# Patient Record
Sex: Female | Born: 1955 | Race: White | Hispanic: No | Marital: Single | Smoking: Never smoker
Health system: Southern US, Community
[De-identification: ages and names within clinical notes are randomized; demographics above are authoritative.]

## PROBLEM LIST (undated history)

## (undated) DIAGNOSIS — M1990 Unspecified osteoarthritis, unspecified site: Secondary | ICD-10-CM

## (undated) DIAGNOSIS — E785 Hyperlipidemia, unspecified: Secondary | ICD-10-CM

## (undated) DIAGNOSIS — Z8619 Personal history of other infectious and parasitic diseases: Secondary | ICD-10-CM

## (undated) DIAGNOSIS — K449 Diaphragmatic hernia without obstruction or gangrene: Secondary | ICD-10-CM

## (undated) DIAGNOSIS — K219 Gastro-esophageal reflux disease without esophagitis: Secondary | ICD-10-CM

## (undated) DIAGNOSIS — E039 Hypothyroidism, unspecified: Secondary | ICD-10-CM

## (undated) DIAGNOSIS — K5792 Diverticulitis of intestine, part unspecified, without perforation or abscess without bleeding: Secondary | ICD-10-CM

## (undated) DIAGNOSIS — E663 Overweight: Secondary | ICD-10-CM

## (undated) HISTORY — DX: Diverticulitis of intestine, part unspecified, without perforation or abscess without bleeding: K57.92

## (undated) HISTORY — DX: Overweight: E66.3

## (undated) HISTORY — DX: Personal history of other infectious and parasitic diseases: Z86.19

## (undated) HISTORY — DX: Hyperlipidemia, unspecified: E78.5

## (undated) HISTORY — DX: Hypothyroidism, unspecified: E03.9

## (undated) HISTORY — PX: REDUCTION MAMMAPLASTY: SUR839

## (undated) HISTORY — DX: Gastro-esophageal reflux disease without esophagitis: K21.9

## (undated) HISTORY — PX: COLONOSCOPY: SHX174

## (undated) HISTORY — DX: Diaphragmatic hernia without obstruction or gangrene: K44.9

## (undated) HISTORY — DX: Unspecified osteoarthritis, unspecified site: M19.90

---

## 1997-10-07 HISTORY — PX: OTHER SURGICAL HISTORY: SHX169

## 1998-06-01 ENCOUNTER — Ambulatory Visit (HOSPITAL_BASED_OUTPATIENT_CLINIC_OR_DEPARTMENT_OTHER): Admission: RE | Admit: 1998-06-01 | Discharge: 1998-06-01 | Payer: Self-pay | Admitting: Orthopedic Surgery

## 1998-07-04 ENCOUNTER — Other Ambulatory Visit: Admission: RE | Admit: 1998-07-04 | Discharge: 1998-07-04 | Payer: Self-pay | Admitting: Obstetrics and Gynecology

## 1998-10-07 HISTORY — PX: OTHER SURGICAL HISTORY: SHX169

## 2000-01-09 ENCOUNTER — Other Ambulatory Visit: Admission: RE | Admit: 2000-01-09 | Discharge: 2000-01-09 | Payer: Self-pay | Admitting: Obstetrics and Gynecology

## 2001-04-21 ENCOUNTER — Other Ambulatory Visit: Admission: RE | Admit: 2001-04-21 | Discharge: 2001-04-21 | Payer: Self-pay | Admitting: Obstetrics and Gynecology

## 2005-08-05 ENCOUNTER — Other Ambulatory Visit: Admission: RE | Admit: 2005-08-05 | Discharge: 2005-08-05 | Payer: Self-pay | Admitting: Obstetrics and Gynecology

## 2006-11-04 ENCOUNTER — Ambulatory Visit: Payer: Self-pay | Admitting: Pulmonary Disease

## 2007-01-21 ENCOUNTER — Ambulatory Visit: Payer: Self-pay | Admitting: Pulmonary Disease

## 2008-03-03 ENCOUNTER — Telehealth: Payer: Self-pay | Admitting: Pulmonary Disease

## 2008-04-12 DIAGNOSIS — E039 Hypothyroidism, unspecified: Secondary | ICD-10-CM | POA: Insufficient documentation

## 2008-04-12 DIAGNOSIS — E785 Hyperlipidemia, unspecified: Secondary | ICD-10-CM | POA: Insufficient documentation

## 2008-04-12 DIAGNOSIS — J45909 Unspecified asthma, uncomplicated: Secondary | ICD-10-CM

## 2008-04-13 ENCOUNTER — Ambulatory Visit: Payer: Self-pay | Admitting: Pulmonary Disease

## 2008-04-13 DIAGNOSIS — K449 Diaphragmatic hernia without obstruction or gangrene: Secondary | ICD-10-CM | POA: Insufficient documentation

## 2008-04-13 DIAGNOSIS — J309 Allergic rhinitis, unspecified: Secondary | ICD-10-CM | POA: Insufficient documentation

## 2008-04-13 DIAGNOSIS — M1990 Unspecified osteoarthritis, unspecified site: Secondary | ICD-10-CM | POA: Insufficient documentation

## 2008-04-13 DIAGNOSIS — E663 Overweight: Secondary | ICD-10-CM | POA: Insufficient documentation

## 2008-04-13 DIAGNOSIS — F411 Generalized anxiety disorder: Secondary | ICD-10-CM | POA: Insufficient documentation

## 2008-09-05 ENCOUNTER — Ambulatory Visit: Payer: Self-pay | Admitting: Pulmonary Disease

## 2008-09-05 ENCOUNTER — Ambulatory Visit: Payer: Self-pay | Admitting: Internal Medicine

## 2008-09-05 ENCOUNTER — Telehealth (INDEPENDENT_AMBULATORY_CARE_PROVIDER_SITE_OTHER): Payer: Self-pay | Admitting: *Deleted

## 2008-09-05 DIAGNOSIS — M549 Dorsalgia, unspecified: Secondary | ICD-10-CM | POA: Insufficient documentation

## 2008-09-05 LAB — CONVERTED CEMR LAB
Bilirubin Urine: NEGATIVE
Crystals: NEGATIVE
Hemoglobin, Urine: NEGATIVE
Ketones, ur: NEGATIVE mg/dL
Leukocytes, UA: NEGATIVE
Mucus, UA: NEGATIVE
Nitrite: NEGATIVE
Specific Gravity, Urine: 1.01 (ref 1.000–1.03)
Total Protein, Urine: NEGATIVE mg/dL
Urine Glucose: NEGATIVE mg/dL
Urobilinogen, UA: 0.2 (ref 0.0–1.0)
pH: 7 (ref 5.0–8.0)

## 2008-09-06 ENCOUNTER — Encounter: Payer: Self-pay | Admitting: Adult Health

## 2008-09-08 ENCOUNTER — Ambulatory Visit: Payer: Self-pay | Admitting: Pulmonary Disease

## 2008-09-08 ENCOUNTER — Telehealth (INDEPENDENT_AMBULATORY_CARE_PROVIDER_SITE_OTHER): Payer: Self-pay | Admitting: *Deleted

## 2008-09-08 ENCOUNTER — Ambulatory Visit: Payer: Self-pay | Admitting: Radiology

## 2008-09-08 ENCOUNTER — Encounter: Payer: Self-pay | Admitting: Adult Health

## 2008-09-08 ENCOUNTER — Ambulatory Visit (HOSPITAL_BASED_OUTPATIENT_CLINIC_OR_DEPARTMENT_OTHER): Admission: RE | Admit: 2008-09-08 | Discharge: 2008-09-08 | Payer: Self-pay | Admitting: Critical Care Medicine

## 2008-09-09 LAB — CONVERTED CEMR LAB
ALT: 31 units/L (ref 0–35)
AST: 30 units/L (ref 0–37)
Albumin: 3.8 g/dL (ref 3.5–5.2)
Alkaline Phosphatase: 84 units/L (ref 39–117)
BUN: 9 mg/dL (ref 6–23)
Basophils Absolute: 0 10*3/uL (ref 0.0–0.1)
Basophils Relative: 0 % (ref 0.0–3.0)
Bilirubin Urine: NEGATIVE
Bilirubin, Direct: 0.1 mg/dL (ref 0.0–0.3)
CO2: 30 meq/L (ref 19–32)
Calcium: 8.6 mg/dL (ref 8.4–10.5)
Chloride: 105 meq/L (ref 96–112)
Creatinine, Ser: 0.9 mg/dL (ref 0.4–1.2)
Eosinophils Absolute: 0.3 10*3/uL (ref 0.0–0.7)
Eosinophils Relative: 4.2 % (ref 0.0–5.0)
GFR calc Af Amer: 85 mL/min
GFR calc non Af Amer: 70 mL/min
Glucose, Bld: 99 mg/dL (ref 70–99)
HCT: 42.8 % (ref 36.0–46.0)
Hemoglobin, Urine: NEGATIVE
Hemoglobin: 14.7 g/dL (ref 12.0–15.0)
Ketones, ur: NEGATIVE mg/dL
Leukocytes, UA: NEGATIVE
Lymphocytes Relative: 34.3 % (ref 12.0–46.0)
MCHC: 34.4 g/dL (ref 30.0–36.0)
MCV: 92.1 fL (ref 78.0–100.0)
Monocytes Absolute: 0.4 10*3/uL (ref 0.1–1.0)
Monocytes Relative: 6.1 % (ref 3.0–12.0)
Neutro Abs: 3.8 10*3/uL (ref 1.4–7.7)
Neutrophils Relative %: 55.4 % (ref 43.0–77.0)
Nitrite: NEGATIVE
Platelets: 287 10*3/uL (ref 150–400)
Potassium: 3.8 meq/L (ref 3.5–5.1)
RBC: 4.64 M/uL (ref 3.87–5.11)
RDW: 12.4 % (ref 11.5–14.6)
Sodium: 140 meq/L (ref 135–145)
Specific Gravity, Urine: <=1.005 (ref 1.000–1.03)
Total Bilirubin: 0.5 mg/dL (ref 0.3–1.2)
Total Protein, Urine: NEGATIVE mg/dL
Total Protein: 6.4 g/dL (ref 6.0–8.3)
Urine Glucose: NEGATIVE mg/dL
Urobilinogen, UA: 0.2 (ref 0.0–1.0)
WBC: 6.8 10*3/uL (ref 4.5–10.5)
pH: 6 (ref 5.0–8.0)

## 2008-09-13 ENCOUNTER — Encounter: Payer: Self-pay | Admitting: Adult Health

## 2008-09-27 ENCOUNTER — Ambulatory Visit: Payer: Self-pay | Admitting: Pulmonary Disease

## 2009-05-08 ENCOUNTER — Telehealth (INDEPENDENT_AMBULATORY_CARE_PROVIDER_SITE_OTHER): Payer: Self-pay | Admitting: *Deleted

## 2009-05-25 ENCOUNTER — Encounter: Payer: Self-pay | Admitting: Pulmonary Disease

## 2009-06-22 ENCOUNTER — Ambulatory Visit: Payer: Self-pay | Admitting: Pulmonary Disease

## 2009-06-22 DIAGNOSIS — B029 Zoster without complications: Secondary | ICD-10-CM | POA: Insufficient documentation

## 2009-06-22 DIAGNOSIS — E559 Vitamin D deficiency, unspecified: Secondary | ICD-10-CM | POA: Insufficient documentation

## 2009-11-20 ENCOUNTER — Telehealth: Payer: Self-pay | Admitting: Pulmonary Disease

## 2010-01-12 ENCOUNTER — Ambulatory Visit: Payer: Self-pay | Admitting: Pulmonary Disease

## 2010-07-16 ENCOUNTER — Ambulatory Visit: Payer: Self-pay | Admitting: Pulmonary Disease

## 2010-07-16 ENCOUNTER — Encounter: Payer: Self-pay | Admitting: Pulmonary Disease

## 2010-07-16 ENCOUNTER — Telehealth: Payer: Self-pay | Admitting: Pulmonary Disease

## 2010-07-19 ENCOUNTER — Encounter: Payer: Self-pay | Admitting: Pulmonary Disease

## 2010-07-20 LAB — CONVERTED CEMR LAB
ALT: 29 units/L (ref 0–35)
AST: 20 units/L (ref 0–37)
Albumin: 4.1 g/dL (ref 3.5–5.2)
Alkaline Phosphatase: 87 units/L (ref 39–117)
BUN: 12 mg/dL (ref 6–23)
Basophils Absolute: 0.1 10*3/uL (ref 0.0–0.1)
Basophils Relative: 1.1 % (ref 0.0–3.0)
Bilirubin, Direct: 0 mg/dL (ref 0.0–0.3)
CO2: 30 meq/L (ref 19–32)
Calcium: 9.1 mg/dL (ref 8.4–10.5)
Chloride: 106 meq/L (ref 96–112)
Cholesterol: 198 mg/dL (ref 0–200)
Creatinine, Ser: 0.9 mg/dL (ref 0.4–1.2)
Eosinophils Absolute: 0.3 10*3/uL (ref 0.0–0.7)
Eosinophils Relative: 5 % (ref 0.0–5.0)
GFR calc non Af Amer: 74.17 mL/min (ref >60)
Glucose, Bld: 108 mg/dL — ABNORMAL HIGH (ref 70–99)
HCT: 42.5 % (ref 36.0–46.0)
HDL: 45.5 mg/dL (ref >39.00)
Hemoglobin: 14.9 g/dL (ref 12.0–15.0)
LDL Cholesterol: 131 mg/dL — ABNORMAL HIGH (ref 0–99)
Lymphocytes Relative: 29.4 % (ref 12.0–46.0)
Lymphs Abs: 2 10*3/uL (ref 0.7–4.0)
MCHC: 34.9 g/dL (ref 30.0–36.0)
MCV: 90.2 fL (ref 78.0–100.0)
Monocytes Absolute: 0.5 10*3/uL (ref 0.1–1.0)
Monocytes Relative: 6.9 % (ref 3.0–12.0)
Neutro Abs: 3.9 10*3/uL (ref 1.4–7.7)
Neutrophils Relative %: 57.6 % (ref 43.0–77.0)
Platelets: 281 10*3/uL (ref 150.0–400.0)
Potassium: 4.3 meq/L (ref 3.5–5.1)
RBC: 4.72 M/uL (ref 3.87–5.11)
RDW: 12.6 % (ref 11.5–14.6)
Sodium: 142 meq/L (ref 135–145)
TSH: 4.69 microintl units/mL (ref 0.35–5.50)
Total Bilirubin: 0.5 mg/dL (ref 0.3–1.2)
Total CHOL/HDL Ratio: 4
Total Protein: 6.5 g/dL (ref 6.0–8.3)
Triglycerides: 110 mg/dL (ref 0.0–149.0)
VLDL: 22 mg/dL (ref 0.0–40.0)
Vit D, 25-Hydroxy: 44 ng/mL (ref 30–89)
WBC: 6.7 10*3/uL (ref 4.5–10.5)

## 2010-07-23 ENCOUNTER — Encounter: Payer: Self-pay | Admitting: Pulmonary Disease

## 2010-07-27 ENCOUNTER — Encounter: Payer: Self-pay | Admitting: Pulmonary Disease

## 2010-11-06 NOTE — Assessment & Plan Note (Signed)
Summary: rov/no better from 11/30--td   Chief Complaint:  Follow Up c/o back pain left lower area.  History of Present Illness: 55 y/o WF with known history of AR, Asthma, Hyperlipidemia, and hypothyroidism. Was followed by DrWelty-Wolfe at [facility] for her Asthma, then got switched to [facility], but both of them have left... I tried to get her to see Dr. Tod Persia but she has refused further referrals for her Asthma... the concern has been for the developement of fixed airway obstruction from her Asthma due to poor control and medication non-compliance in the past... she has been on ADVAIR 500/50 Bid but not taken on consistent basis.    September 05, 2008--Presents for an acute office  visit with left lower back pain. Started 3 days ago with throbbing pain in left lower back wtih radiation to hip/side. Severe at times, "waves of pain", no urinary symptoms,  . Pain at all times but worse with bending and tender at times. took vicoidin with no help.--UA neg Urine cx neg. KUB neg. tx for musculoskeletal -nsaids/skelaxin and vicodin-heat/ice  September 08, 2008 --Returns with persistent left back/flank pain with radiation to side/ribs/hip. nsaids/vicodin/skelaxin helped but only takes edge off. Still very severe, throbbing pain that comes in waves, very tender over left flank and lateral ribs. increased pain with sitting . Denies chest pain, dyspnea, orthopnea, hemoptysis, fever, n/v/d, edema, calf pain, swelling, urinary symptoms, vaginal bleeding, palipations, diarrhea. 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 no initial pain, dyspnea, neck pain, ext. weakness.      Prior Medication List:  ADVAIR DISKUS 500-50 MCG/DOSE  MISC (FLUTICASONE-SALMETEROL) 1 puff two times a day (rinse mouth after each use) VENTOLIN HFA 108 (90 BASE) MCG/ACT  AERS (ALBUTEROL SULFATE) 1-2 sprays Q 4 H as needed for wheezing... ARMOUR THYROID 60 MG  TABS (THYROID) Take 1 tablet  by mouth once a day NEXIUM 40 MG  CPDR (ESOMEPRAZOLE MAGNESIUM) Take 1 tablet by mouth once a day ANTI-OXIDANT   TABS (MULTIPLE VITAMIN) once daily VICODIN 5-500 MG TABS (HYDROCODONE-ACETAMINOPHEN) 1 every 4 to 6 hours as needed for pain SKELAXIN 800 MG TABS (METAXALONE) 1 by mouth three times a day as needed IBUPROFEN 800 MG TABS (IBUPROFEN) 1 by mouth three times a day with food for 7 days   Updated Prior Medication List: ADVAIR DISKUS 500-50 MCG/DOSE  MISC (FLUTICASONE-SALMETEROL) 1 puff two times a day (rinse mouth after each use) VENTOLIN HFA 108 (90 BASE) MCG/ACT  AERS (ALBUTEROL SULFATE) 1-2 sprays Q 4 H as needed for wheezing... ARMOUR THYROID 60 MG  TABS (THYROID) Take 1 tablet by mouth once a day NEXIUM 40 MG  CPDR (ESOMEPRAZOLE MAGNESIUM) Take 1 tablet by mouth once a day ANTI-OXIDANT   TABS (MULTIPLE VITAMIN) once daily VICODIN 5-500 MG TABS (HYDROCODONE-ACETAMINOPHEN) 1 every 4 to 6 hours as needed for pain SKELAXIN 800 MG TABS (METAXALONE) 1 by mouth three times a day as needed IBUPROFEN 800 MG TABS (IBUPROFEN) 1 by mouth three times a day with food for 7 days  Current Allergies (reviewed today): No known allergies   Past Medical History:    Reviewed history from 09/05/2008 and no changes required:       ALLERGIC RHINITIS (ICD-477.9) - ++allergy testing by DrESL in the 1980's w/ reactions to grasses, ragweed, dust, molds, cats, dog... prev on shots...              ASTHMA (ICD-493.90) - on ADVAIR  500Bid, & VENTOLIN HFA as needed... she wants refills today.        ~  baseline CXR shows sl incr markings, NAD...        ~  PFT's 5/05 showed FVC= 2.47 (76%), FEV1= 1.51 (56%), %1sec=61, mid-flows= 22%pred...        ~  in 2006 she entered the EXTRA trial at [facility] which was a double blind trial testing XOLAIR...        ~  last note from [facility] 6/07 DrCarraway w/ concern for her fixed airway obstruction... pt [facility] f[facility]u in the [facility]...              HYPERLIPIDEMIA (ICD-272.4)  - prev on Zetia, on diet alone at present... followed by DrDoerr, we have not had any labs on her since the late 90's...              HYPOTHYROIDISM (ICD-244.9) - taking ARMOUR THYROID 60mg /d... she is followed by an Actor, DrDoerr in [place] for thyroid and her hyperlipidemia... she was prev on Levothyroid and had requested change to Armour Thyroid last year...              OVERWEIGHT (ICD-278.02) - weight= 199# today, no change in the last yr...                  Family History:    Reviewed history and no changes required:  Social History:    Reviewed history and no changes required:   Risk Factors: Tobacco use:  never   Review of Systems      See HPI   Vital Signs:  Patient Profile:   55 Years Old Female Weight:      195 pounds O2 Sat:      95 % O2 treatment:    Room Air Temp:     98.2 degrees F oral Pulse rate:   72 / minute BP sitting:   130 / 84  (left arm)  Vitals Entered By: Renold Genta RCP, LPN (September 08, 2008 2:16 PM)             Is Patient Diabetic? No Comments Medications reviewed with patient Renold Genta RCP, LPN  September 08, 2008 2:17 PM      Physical Exam  WD, sl overweight, 55 y/o WF in NAD... GENERAL:  Alert & oriented; pleasant & cooperative... HEENT:  [place]/AT, EOM-wnl, PERRLA, Fundi-benign, EACs-clear, TMs-wnl, NOSE-clear, THROAT-clear & wnl. NECK:  Supple w/ full ROM; no JVD; normal carotid impulses w/o bruits; no thyromegaly or nodules palpated; no lymphadenopathy. CHEST:  Clear to P & A; decr BS bilat; without wheezes/ rales/ or rhonchi heard... HEART:  Regular Rhythm; without murmurs/ rubs/ or gallops detected... ABDOMEN:  Soft & nontender; normal bowel sounds; no organomegaly or masses palpated...no guarding or rebound neg CVA tenderness.  EXT: without deformities, mild arthritic changes; no varicose veins/ venous insuffic/ or edema. tenderness along left flank down to lumbar-sacral region. neg SLRs, also tender along  left lateral ribs.   NEURO:  CN's intact; no focal neuro deficits...nml grips bilaterally, equal strength bilaterally of UE/LE DERM:  No lesions noted; no rash etc...        Impression & Recommendations:  Problem # 1:  BACK PAIN (ICD-724.5) Left flank and low back pain ? etiology.  Pt continues with severe back pain despite aggressive pain meds.  Will need further evaluation. check cxr with rib detail --looking for rib fx. also lung abnormalities. Low suspicion for PE, will  check D. dimer.  Will repeat UA, looking for hemautura. Check CT abd/pelvis--looking for kidney stone, and/or other abdnormalities. will follow up on above labs/test.  Orders: Radiology Referral (Radiology) T-2 View CXR, Same Day (71020.5TC) TLB-BMP (Basic Metabolic Panel-BMET) (80048-METABOL) TLB-CBC Platelet - w/Differential (85025-CBCD) TLB-Hepatic/Liver Function Pnl (80076-HEPATIC) TLB-Udip ONLY (81003-UDIP) T-D-Dimer Fibrin Derivatives Quantitive (30865-78469) Est. Patient Level IV (62952)   Complete Medication List: 1)  Advair Diskus 500-50 Mcg/dose Misc (Fluticasone-salmeterol) .Marland Kitchen.. 1 puff two times a day (rinse mouth after each use) 2)  Ventolin Hfa 108 (90 Base) Mcg/act Aers (Albuterol sulfate) .Marland Kitchen.. 1-2 sprays q 4 h as needed for wheezing... 3)  Armour Thyroid 60 Mg Tabs (Thyroid) .... Take 1 tablet by mouth once a day 4)  Nexium 40 Mg Cpdr (Esomeprazole magnesium) .... Take 1 tablet by mouth once a day 5)  Anti-oxidant Tabs (Multiple vitamin) .... Once daily 6)  Vicodin 5-500 Mg Tabs (Hydrocodone-acetaminophen) .Marland Kitchen.. 1 every 4 to 6 hours as needed for pain 7)  Skelaxin 800 Mg Tabs (Metaxalone) .Marland Kitchen.. 1 by mouth three times a day as needed 8)  Ibuprofen 800 Mg Tabs (Ibuprofen) .Marland Kitchen.. 1 by mouth three times a day with food for 7 days   Patient Instructions: 1)  We are doing lab work today and chest xray 2)  We are also doing a CT scan of your abdomen and pelvis . 3)  I will call you wth the results.    4)  Continue on your present regimen for now.  5)  Please contact office for sooner follow up if symptoms do not improve or worsen    ]

## 2010-11-06 NOTE — Progress Notes (Signed)
Summary: rx refills  Phone Note Call from Patient Call back at Home Phone [phone]   Call For: nadel Summary of Call: pt wants refills of advair and nexium. she has made a f/u w/ dr Kriste Basque for sept. but is "not happy" that she has to come back for an appt. says she has spent a lot of money w/ theses appts.  Initial call taken by: Tivis Ringer,  May 08, 2009 10:10 AM  Follow-up for Phone Call        spoke with pt and she was fine with september appt and i sent refills to pharmacy--pt aware Follow-up by: Philipp Deputy CMA,  May 08, 2009 11:19 AM    New/Updated Medications: ADVAIR DISKUS 500-50 MCG/DOSE  MISC (FLUTICASONE-SALMETEROL) 1 puff two times a day (rinse mouth after each use) Prescriptions: NEXIUM 40 MG  CPDR (ESOMEPRAZOLE MAGNESIUM) Take 1 tablet by mouth once a day  #30 x 11   Entered by:   Philipp Deputy CMA   Authorized by:   Michele Mcalpine MD   Signed by:   Philipp Deputy CMA on 05/08/2009   Method used:   Electronically to        [facility] (retail)       [address]       [place], [place]  [address]       Ph: [phone]       Fax: [phone]   RxID:   [id number] ADVAIR DISKUS 500-50 MCG/DOSE  MISC (FLUTICASONE-SALMETEROL) 1 puff two times a day (rinse mouth after each use)  #1 x 11   Entered by:   Philipp Deputy CMA   Authorized by:   Michele Mcalpine MD   Signed by:   Philipp Deputy CMA on 05/08/2009   Method used:   Electronically to        [facility] (retail)       [address]       [place], [place]  [address]       Ph: [phone]       Fax: [phone]   RxID:   [id number]

## 2010-11-06 NOTE — Progress Notes (Signed)
Summary: req to see sn for thyroid-LMTCB x 1  Phone Note Call from Patient Call back at Home Phone [phone]   Caller: Patient Call For: Quinnlyn Hearns Summary of Call: pt states that the dr who treats her for thyroid issues has left the practice. pt wants to know if sn will see her for this.  Initial call taken by: Tivis Ringer, CNA,  November 20, 2009 10:27 AM  Follow-up for Phone Call        [facility] Vernie Murders  November 20, 2009 10:34 AM  Pt states she saw Dr. Roanna Raider in [place], but she has moved away. Pt is wanting to know if SN can follow- her for her thyroid instead of her having to establish with another specialist? Also Dr. Roanna Raider treated her for high cholesterol and vit d def.  Please advise. Carron Curie CMA  November 20, 2009 10:46 AM   Additional Follow-up for Phone Call Additional follow up Details #1::        per SN---yes this is ok---schedule a follow up with labs.  thanks Randell Loop CMA  November 20, 2009 11:34 AM   she will not need labs prior to ov---SN will see her first then decide what labs to do .  thanks Randell Loop CMA  November 20, 2009 12:17 PM     Additional Follow-up for Phone Call Additional follow up Details #2::    Please advise what sepcific labs need to be ordered thanks Vernie Murders  November 20, 2009 12:01 PM  pt scheduled to see SN 01/12/10 at 11:00am.Jennifer [facility] CMA  November 20, 2009 12:23 PM

## 2010-11-06 NOTE — Assessment & Plan Note (Signed)
Summary: pain/LC   Chief Complaint:  Acute visit.  C/o pain in her lower back on the left side x 3 days.  Pain is too severe to sit down.  Marland Kitchen  History of Present Illness: 55 y/o WF with known history of AR, Asthma, Hyperlipidemia, and hypothyroidism. Was followed by DrWelty-Wolfe at [facility] for her Asthma, then got switched to [facility], but both of them have left... I tried to get her to see Dr. Tod Persia but she has refused further referrals for her Asthma... the concern has been for the developement of fixed airway obstruction from her Asthma due to poor control and medication non-compliance in the past... she has been on ADVAIR 500/50 Bid but not taken on consistent basis.    September 05, 2008--Presents for an acute office  visit with left lower back pain. Started 3 days ago with throbbing pain in left lower back wtih radiation to hip/side. Severe at times, "waves of pain", no urinary symptoms, Denies chest pain, dyspnea, orthopnea, hemoptysis, fever, n/v/d, edema, bloody stools, known injury. Pain at all times but worse with bending and tender at times. took vicoidin with no help.     Updated Prior Medication List: ADVAIR DISKUS 500-50 MCG/DOSE  MISC (FLUTICASONE-SALMETEROL) 1 puff two times a day (rinse mouth after each use) VENTOLIN HFA 108 (90 BASE) MCG/ACT  AERS (ALBUTEROL SULFATE) 1-2 sprays Q 4 H as needed for wheezing... ARMOUR THYROID 60 MG  TABS (THYROID) Take 1 tablet by mouth once a day NEXIUM 40 MG  CPDR (ESOMEPRAZOLE MAGNESIUM) Take 1 tablet by mouth once a day ANTI-OXIDANT   TABS (MULTIPLE VITAMIN) once daily VICODIN 5-500 MG TABS (HYDROCODONE-ACETAMINOPHEN) 1 every 4 to 6 hours as needed for pain SKELAXIN 800 MG TABS (METAXALONE) 1 by mouth three times a day as needed IBUPROFEN 800 MG TABS (IBUPROFEN) 1 by mouth three times a day with food for 7 days  Current Allergies (reviewed today): No known allergies   Past Medical History:    Reviewed history from  04/13/2008 and no changes required:       ALLERGIC RHINITIS (ICD-477.9) - ++allergy testing by DrESL in the 1980's w/ reactions to grasses, ragweed, dust, molds, cats, dog... prev on shots...              ASTHMA (ICD-493.90) - on ADVAIR 500Bid, & VENTOLIN HFA as needed... she wants refills today.        ~  baseline CXR shows sl incr markings, NAD...        ~  PFT's 5/05 showed FVC= 2.47 (76%), FEV1= 1.51 (56%), %1sec=61, mid-flows= 22%pred...        ~  in 2006 she entered the EXTRA trial at [facility] which was a double blind trial testing XOLAIR...        ~  last note from [facility] 6/07 DrCarraway w/ concern for her fixed airway obstruction... pt refused f/u in the [facility]...              HYPERLIPIDEMIA (ICD-272.4) - prev on Zetia, on diet alone at present... followed by DrDoerr, we have not had any labs on her since the late 90's...              HYPOTHYROIDISM (ICD-244.9) - taking ARMOUR THYROID 60mg /d... she is followed by an Actor, DrDoerr in [place] for thyroid and her hyperlipidemia... she was prev on Levothyroid and had requested change to Armour Thyroid last year.Marland KitchenMarland Kitchen  OVERWEIGHT (ICD-278.02) - weight= 199# today, no change in the last yr...                  Family History:    Reviewed history and no changes required:  Social History:    Reviewed history and no changes required:   Risk Factors:  Tobacco use:  never   Review of Systems      See HPI   Vital Signs:  Patient Profile:   55 Years Old Female Weight:      201.38 pounds O2 Sat:      97 % O2 treatment:    Room Air Temp:     97.1 degrees F oral Pulse rate:   78 / minute BP sitting:   146 / 88  (left arm)  Vitals Entered By: Vernie Murders (September 05, 2008 4:11 PM)             Is Patient Diabetic? No     Physical Exam  WD, sl overweight, 55 y/o WF in NAD... GENERAL:  Alert & oriented; pleasant & cooperative... HEENT:  [place]/AT, EOM-wnl, PERRLA, Fundi-benign, EACs-clear, TMs-wnl,  NOSE-clear, THROAT-clear & wnl. NECK:  Supple w/ full ROM; no JVD; normal carotid impulses w/o bruits; no thyromegaly or nodules palpated; no lymphadenopathy. CHEST:  Clear to P & A; decr BS bilat; without wheezes/ rales/ or rhonchi heard... HEART:  Regular Rhythm; without murmurs/ rubs/ or gallops detected... ABDOMEN:  Soft & nontender; normal bowel sounds; no organomegaly or masses palpated...no guarding or rebound neg CVA tenderness.  EXT: without deformities, mild arthritic changes; no varicose veins/ venous insuffic/ or edema. tenderness along left lumbar-sacral region. neg SLRs,  NEURO:  CN's intact; no focal neuro deficits... DERM:  No lesions noted; no rash etc...        Impression & Recommendations:  Problem # 1:  BACK PAIN (ICD-724.5) Questionable etiology - pt UA and KUB unremarkable for kidney stone.  Probable musculoskeletal in nature- REC:  Increase fluids, drink plenty of fluids Motrin 800mg  three times a day for 7 days with food.  Skelaxin 800mg  three times a day as needed muscle spasm Vicodin 1-2 every 4-6 hr as needed for severe pain.  Ice and heat to low back three times a day for .  Please contact office for sooner follow up if symptoms do not improve or worsen  follow up .2 weeks and as needed   Orders: T-1 View Abdomen (KUB) (74000TC) TLB-Udip w/ Micro (81001-URINE) T-Urine Culture (Spectrum Order) ([id number]) Est. Patient Level IV (09811)   Medications Added to Medication List This Visit: 1)  Vicodin 5-500 Mg Tabs (Hydrocodone-acetaminophen) .Marland Kitchen.. 1 every 4 to 6 hours as needed for pain 2)  Skelaxin 800 Mg Tabs (Metaxalone) .Marland Kitchen.. 1 by mouth three times a day as needed 3)  Ibuprofen 800 Mg Tabs (Ibuprofen) .Marland Kitchen.. 1 by mouth three times a day with food for 7 days  Complete Medication List: 1)  Advair Diskus 500-50 Mcg/dose Misc (Fluticasone-salmeterol) .Marland Kitchen.. 1 puff two times a day (rinse mouth after each use) 2)  Ventolin Hfa 108 (90 Base) Mcg/act  Aers (Albuterol sulfate) .Marland Kitchen.. 1-2 sprays q 4 h as needed for wheezing... 3)  Armour Thyroid 60 Mg Tabs (Thyroid) .... Take 1 tablet by mouth once a day 4)  Nexium 40 Mg Cpdr (Esomeprazole magnesium) .... Take 1 tablet by mouth once a day 5)  Anti-oxidant Tabs (Multiple vitamin) .... Once daily 6)  Vicodin 5-500 Mg Tabs (Hydrocodone-acetaminophen) .Marland Kitchen.. 1 every 4 to 6  hours as needed for pain 7)  Skelaxin 800 Mg Tabs (Metaxalone) .Marland Kitchen.. 1 by mouth three times a day as needed 8)  Ibuprofen 800 Mg Tabs (Ibuprofen) .Marland Kitchen.. 1 by mouth three times a day with food for 7 days   Patient Instructions: 1)  Increase fluids, drink plenty of fluids 2)  Motrin 800mg  three times a day fo r7 days with food.  3)  Skelaxin 800mg  three times a day as needed muscle spasm 4)  Vicodin 1-2 every 4-6 hr as needed for severe pain.  5)  Ice and heat to low back three times a day for .  6)  Please contact office for sooner follow up if symptoms do not improve or worsen  7)  follow up .2 weeks and as needed    Prescriptions: IBUPROFEN 800 MG TABS (IBUPROFEN) 1 by mouth three times a day with food for 7 days  #21 x 0   Entered and Authorized by:   Rubye Oaks NP   Signed by:   Tammy Parrett NP on 09/05/2008   Method used:   Electronically to        [facility] (retail)       [address]       [place], [place]  [address]       Ph: [phone]       Fax: [phone]   RxID:   [id number] SKELAXIN 800 MG TABS (METAXALONE) 1 by mouth three times a day as needed  #30 x 0   Entered and Authorized by:   Rubye Oaks NP   Signed by:   Tammy Parrett NP on 09/05/2008   Method used:   Electronically to        [facility] (retail)       [address]       [place], [place]  [address]       Ph: [phone]       Fax: [phone]   RxID:   [id number]  ]

## 2010-11-06 NOTE — Medication Information (Signed)
Summary: Tax adviser   Imported By: Valinda Hoar 05/25/2009 08:25:40  _____________________________________________________________________  External Attachment:    Type:   Image     Comment:   External Document

## 2010-11-06 NOTE — Progress Notes (Signed)
Summary: need refill  Phone Note Call from Patient Call back at [phone]   Caller: Patient Call For: nadel Summary of Call: calling for refill for advair 500/50 [facility] [place] Initial call taken by: Rickard Patience,  Mar 03, 2008 1:48 PM  Follow-up for Phone Call        requested pts chart to review meds Marijo File CMA  Mar 03, 2008 1:52 PM     New/Updated Medications: ADVAIR DISKUS 500-50 MCG/DOSE  MISC (FLUTICASONE-SALMETEROL) 1 puff two times a day  rinse mouth after each use NEXIUM 40 MG  CPDR (ESOMEPRAZOLE MAGNESIUM) Take 1 tablet by mouth once a day   Prescriptions: NEXIUM 40 MG  CPDR (ESOMEPRAZOLE MAGNESIUM) Take 1 tablet by mouth once a day  #30 x 6   Entered by:   Marijo File CMA   Authorized by:   Michele Mcalpine MD   Signed by:   Marijo File CMA on 03/03/2008   Method used:   Electronically sent to ...       [facility]       [address]       [place], [place]  [address]       Ph: [phone]       Fax: [phone]   RxID:   [id number] ADVAIR DISKUS 500-50 MCG/DOSE  MISC (FLUTICASONE-SALMETEROL) 1 puff two times a day  rinse mouth after each use  #1 x 0   Entered by:   Marijo File CMA   Authorized by:   Michele Mcalpine MD   Signed by:   Marijo File CMA on 03/03/2008   Method used:   Electronically sent to ...       [facility]       [address]       [place], [place]  [address]       Ph: [phone]       Fax: [phone]   RxID:   [id number]

## 2010-11-06 NOTE — Medication Information (Signed)
Summary: Tax adviser   Imported By: Lehman Prom 07/19/2010 16:50:15  _____________________________________________________________________  External Attachment:    Type:   Image     Comment:   External Document

## 2010-11-06 NOTE — Assessment & Plan Note (Signed)
Summary: 6 months/apc   CC:  6 month ROV & review of mult medical problems....  History of Present Illness: 55 y/o WF here for a follow up visit... she has multiple medical problems as noted below... she was followed by DrWelty-Wolfe at [facility] for her Asthma, then got switched to [facility], but both of them have left... I tried to get her to see Dr. Tod Persia but pt has refused further referrals for her Asthma... the concern has been for the developement of fixed airway obstruction from her Asthma due to poor control and medication non-compliance in the past...    ~  June 22, 2009:  last seen 7/09 for med refills & refused referral to the [facility]... states that she's had a good year x for left side/ flank pain in 12/09 that turned out to be Shingles- Rx w/ Valtrex/ Pred & finally resolved... states she's been using her Advair500 twice daily + rescue inhaler several times per week... notes mild exac over the last 2 weeks- denies infection, etc... we discussed Rx w/ Medrol Dosepak & given samples of Symbicort/ Dulera to see if they work any better.   ~  January 12, 2010:  she requests that we start following her Chol & Thyroid since her LMD DrDoerr (Endocrine) from [place] has left... pt was taking Simva40 & Armour Thyroid 60mg  but admits to intermittent dosing & hasn't taken in some time she says... similarly she is only using the Advair once daily & only uses the Nexium Prn for pain... she is not fasting today & we decided to refill meds as she requested, encourage regular dosing of all medications, and ROV later in the fasting state for blood work.   ~  July 16, 2010:  she notes some incr SOB, esp in the AM & w/ activities- better as the day goes on... she has tenderness in the ribs & chest wall w/ mult trigger points, fatigue, not resting well, wakes tired, etc> all raising the poss of Fibromyalgia... we decided to check her CXR (clear, NAD);   PFT (mild obstruction,  ?superimposed restriction?);  & fasting labs (OK x LDL131)...  she refuses Pneumovax & Flu vaccine... advised to take meds regularly, diet, exercise, get weight down...   Current Problem List:  ALLERGIC RHINITIS (ICD-477.9)  ++allergy testing by DrESL in the 1980's w/ reactions to grasses, ragweed, dust, molds, cats, dog... prev on shots...  ASTHMA (ICD-493.90) - on ADVAIR 500Bid, & VENTOLIN HFA as needed... she admits to using Advair once dailly & is encouraged to take meds regularly as perscribed...  ~  baseline CXR shows sl incr markings, NAD...  ~  PFT's 5/05 showed FVC= 2.47 (76%), FEV1= 1.51 (56%), %1sec=61, mid-flows= 22%pred...  ~  in 2006 she entered the EXTRA trial at [facility] which was a double blind trial testing XOLAIR...  ~  last note from [facility] 6/07 DrCarraway w/ concern for her fixed airway obstruction... pt refused f/u in the [facility] Center...  ~  f/u PFT 10/11 showed FVC= 2.52 (71%), FEV1= 1.69 (60%), %1sec=67, mid-flows= 38%pred.  ~  CXR 10/11 showed clear lungs, NAD...  HYPERLIPIDEMIA (ICD-272.4) - prev followed by DrDoerr, Endocrinology in [place]... prev on Zetia, now on SIMVASTATIN 40mg /d... pt doesn't know her numbers and we don't have records from DrD...  ~  4/11: pt presents w/ request to start writing Simva Rx & monitor labs since DrDoerr has left [place]... pt encouraged to take med regularly Qhs & follow low chol/  low fat diet...  ~  FLP 10/11 on Simva40 showed TChol 198, TG 110, HDL 46, LDL 131  HYPOTHYROIDISM (ICD-244.9) - taking ARMOUR THYROID 60mg /d... she is followed by DrDoerr, Endocrine in [place] for thyroid and her hyperlipidemia... she was prev on Levothyroid and had requested change to Armour Thyroid last year because she believed the Levothy was making her hair fall out...  ~  4/11:  pt reqested that we write her Rx for the Armour Thyroid 60mg  since DrDoerr has left [place]... pt is uncouraged to take the med daily.  ~  labs 10/11 showed TSH= 4.69  OVERWEIGHT  (ICD-278.02) - we reviewed diet + exercise program required to lose weight...  ~  weight 9/10 = 200#,  she is 5\' 5"  tall,  BMI= 34.  ~  weight 4/11 = 192#,  this is as good as she's been in several yrs.  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 she continues to treat her reflux as needed w/ the Nexium and OTC Zantac even though we have felt this plays a signif roll in her Asthma... the consultants at [facility] agreed, but were no more effective at getting her to take her meds regularly... 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  ~  4/11:  meds re-written & encouraged to take Nexium 40mg /d- 30 min before the 1st meal, and Zantac 75- 1-2 at bedtime...  GYN = DrRomaine in the past... pt states that she stopped going because she couldn't help her w/ her hot flashes.  OSTEOARTHRITIS (ICD-715.90) - she had a left knee arthroscopy by DrPaul in 1999...  VITAMIN D DEFICIENCY (ICD-268.9) - pt takes Vit D 50,000 u weekly from DrDoerr... we don't have prev labs from her but pt indicates that the Vit D level was "really really low" so we will refill the Rx & f/u Vit D level later.  ~  labs 10/11 showed Vit D level = 44... OK to switch to 1-2000u daily OTC supplement.  ANXIETY (ICD-300.00)  Hx of SHINGLES (ICD-053.9) - she presented w/ cryptic left flank pain 12/09 that turned out to be Shingles... treated w/ Valtrex, Pred, Vicodin. NOTE:  pt refuses Flu shots and Vaccinations...   Preventive Screening-Counseling & Management  Alcohol-Tobacco     Smoking Status: never  Allergies (verified): No Known Drug Allergies  Comments:  Nurse/Medical Assistant: The patient's medications and allergies were reviewed with the patient and were updated in the Medication and Allergy Lists.  Past History:  Past Medical History: ALLERGIC  RHINITIS (ICD-477.9) ASTHMA (ICD-493.90) HYPERLIPIDEMIA (ICD-272.4) HYPOTHYROIDISM (ICD-244.9) OVERWEIGHT (ICD-278.02) HIATAL HERNIA WITH REFLUX (ICD-553.3) OSTEOARTHRITIS (ICD-715.90) VITAMIN D DEFICIENCY (ICD-268.9) ANXIETY (ICD-300.00) Hx of SHINGLES (ICD-053.9)  Past Surgical History: S/P left knee arthroscopy in 1999 by DrPaul S/P breast reductions in 2000 in [place]  Family History: Reviewed history from 01/12/2010 and no changes required. Father died age 19 w/ heart dis, kidney dis, hx asthma. Mother alive age 18 w/ hx chr leukemia 2 Siblings: 1 Brother in good health 1 Sister in good heath  Social History: Reviewed history and no changes required.  Review of Systems      See HPI       The patient complains of dyspnea on exertion.  The patient denies anorexia, fever, weight loss, weight gain, vision loss, decreased hearing, hoarseness,  chest pain, syncope, peripheral edema, prolonged cough, headaches, hemoptysis, abdominal pain, melena, hematochezia, severe indigestion/heartburn, hematuria, incontinence, muscle weakness, suspicious skin lesions, transient blindness, difficulty walking, depression, unusual weight change, abnormal bleeding, enlarged lymph nodes, and angioedema.    Vital Signs:  Patient profile:   55 year old female Height:      65 inches Weight:      197.13 pounds BMI:     32.92 O2 Sat:      94 % on Room air Temp:     100 degrees F oral Pulse rate:   75 / minute BP sitting:   130 / 86  (right arm) Cuff size:   regular  Vitals Entered By: Randell Loop CMA (July 16, 2010 9:02 AM)  O2 Sat at Rest %:  94 O2 Flow:  Room air CC: 6 month ROV & review of mult medical problems... Is Patient Diabetic? No Pain Assessment Patient in pain? yes      Onset of pain  lower back pain x 1 year Comments meds updated today with pt   Physical Exam  Additional Exam:  WD, overweight, 55 y/o WF in NAD... GENERAL:  Alert & oriented; &  cooperative... HEENT:  [place]/AT, EOM-wnl, PERRLA, Fundi-benign, EACs-clear, TMs-wnl, NOSE-clear, THROAT-clear & wnl. NECK:  Supple w/ full ROM; no JVD; normal carotid impulses w/o bruits; no thyromegaly or nodules palpated; no lymphadenopathy. CHEST:  Clear to P & A; decr BS bilat; without wheezes/ rales/ or rhonchi heard... HEART:  Regular Rhythm; without murmurs/ rubs/ or gallops detected... ABDOMEN:  Soft & nontender; normal bowel sounds; no organomegaly or masses palpated... EXT: without deformities, mild arthritic changes; no varicose veins/ venous insuffic/ or edema. NEURO:  CN's intact; no focal neuro deficits... DERM:  No lesions noted; no rash etc...    CXR  Procedure date:  07/16/2010  Findings:      CHEST - 2 VIEW Comparison: Chest x-ray of 09/08/2008   Findings: No active infiltrate or effusion is seen.  Mediastinal contours are stable.  The heart is within normal limits in size. No acute bony abnormality is seen.   IMPRESSION: No active lung disease.   Read By:  Juline Patch,  M.D.   MISC. Report  Procedure date:  07/16/2010  Findings:      BMP (METABOL)   Sodium                    142 mEq/L                   135-145   Potassium                 4.3 mEq/L                   3.5-5.1   Chloride                  106 mEq/L                   96-112   Carbon Dioxide            30 mEq/L                    19-32   Glucose              [H]  108 mg/dL                   16-10   BUN  12 mg/dL                    1-61   Creatinine                0.9 mg/dL                   0.9-6.0   Calcium                   9.1 mg/dL                   4.5-40.9   GFR                       74.17 mL/min                >60  Hepatic/Liver Function Panel (HEPATIC)   Total Bilirubin           0.5 mg/dL                   8.1-1.9   Direct Bilirubin          0.0 mg/dL                   1.4-7.8   Alkaline Phosphatase      87 U/L                      39-117   AST                        20 U/L                      0-37   ALT                       29 U/L                      0-35   Total Protein             6.5 g/dL                    2.9-5.6   Albumin                   4.1 g/dL                    2.1-3.0  CBC Platelet w/Diff (CBCD)   White Cell Count          6.7 K/uL                    4.5-10.5   Red Cell Count            4.72 Mil/uL                 3.87-5.11   Hemoglobin                14.9 g/dL                   86.5-78.4   Hematocrit                42.5 %                      36.0-46.0   MCV  90.2 fl                     78.0-100.0   Platelet Count            281.0 K/uL                  150.0-400.0   Neutrophil %              57.6 %                      43.0-77.0   Lymphocyte %              29.4 %                      12.0-46.0   Monocyte %                6.9 %                       3.0-12.0   Eosinophils%              5.0 %                       0.0-5.0   Basophils %               1.1 %                       0.0-3.0  Comments:      Lipid Panel (LIPID)   Cholesterol               198 mg/dL                   0-454   Triglycerides             110.0 mg/dL                 0.9-811.9   HDL                       14.78 mg/dL                 >29.56   LDL Cholesterol      [H]  213 mg/dL                   0-86  TSH (TSH)   FastTSH                   4.69 uIU/mL                 0.35-5.50  Vitamin D (25-Hydroxy) (57846)  Vitamin D (25-Hydroxy)                             44 ng/mL                    30-89   Pre-Spirometry FVC    Value: 2.52 L/min   Pred: 3.5 L/min     % Pred: 71 % FEV1    Value: 1.69 L     Pred: 2.8 L     % Pred: 60 % FEV1/FVC  Value: 67 %     Pred: 82 %     % Pred: -- % FEF 25-75  Value: -- L/min  Pred: -- L/min     % Pred: 38 %  Comments: mild to mod obstruction, can't r/o superimposed restriction...  SN  Impression & Recommendations:  Problem # 1:  ASTHMA (ICD-493.90) We discussed asthma again & the need for regular  dosing of meds to reduce airway inflamm and prevent the developement & worsening of fixed airflow obstruction over time... asked to take the Advair Bid regularly & be regular w/ her antireflux regimen as well... Her updated medication list for this problem includes:    Advair Diskus 500-50 Mcg/dose Misc (Fluticasone-salmeterol) .Marland Kitchen... 1 puff two times a day (rinse mouth after each use)    Ventolin Hfa 108 (90 Base) Mcg/act Aers (Albuterol sulfate) .Marland Kitchen... 1-2 sprays q 4 h as needed for wheezing...  Orders: T-2 View CXR (71020TC) Spirometry w/Graph (94010)  Problem # 2:  HYPERLIPIDEMIA (ICD-272.4) Reminded to take the simva daily & follow low chol/ low fat diet... Her updated medication list for this problem includes:    Simvastatin 40 Mg Tabs (Simvastatin) .Marland Kitchen... Take one by mouth once daily  Orders: TLB-BMP (Basic Metabolic Panel-BMET) (80048-METABOL) TLB-Hepatic/Liver Function Pnl (80076-HEPATIC) TLB-CBC Platelet - w/Differential (85025-CBCD) TLB-Lipid Panel (80061-LIPID) TLB-TSH (Thyroid Stimulating Hormone) (84443-TSH) T-Vitamin D (25-Hydroxy) (16109-60454)  Problem # 3:  HYPOTHYROIDISM (ICD-244.9) She will continue the Armor thy daily at her request... Her updated medication list for this problem includes:    Armour Thyroid 60 Mg Tabs (Thyroid) .Marland Kitchen... Take 1 tablet by mouth two times a day  Problem # 4:  OVERWEIGHT (ICD-278.02) We reviewed diet + exercise recommendations...  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 Her updated medication list for this problem includes:    Nexium 40 Mg Cpdr (Esomeprazole magnesium) .Marland Kitchen... Take 1 tablet by mouth once a day (30 min before the first meal of the day)    Zantac 75 75 Mg Tabs (Ranitidine hcl) .Marland Kitchen... Take 1-2 tab by mouth at bedtime  Problem # 6:  VITAMIN D DEFICIENCY (ICD-268.9) Vit D level improved & OK to switch to OTC Vit D supplement  ~10-1998 u  daily...  Problem # 7:  OTHER MEDICAL PROBLEMS AS NOTED>>> She refuses all vaccines...  Complete Medication List: 1)  Advair Diskus 500-50 Mcg/dose Misc (Fluticasone-salmeterol) .Marland Kitchen.. 1 puff two times a day (rinse mouth after each use) 2)  Ventolin Hfa 108 (90 Base) Mcg/act Aers (Albuterol sulfate) .Marland Kitchen.. 1-2 sprays q 4 h as needed for wheezing... 3)  Simvastatin 40 Mg Tabs (Simvastatin) .... Take one by mouth once daily 4)  Armour Thyroid 60 Mg Tabs (Thyroid) .... Take 1 tablet by mouth two times a day 5)  Nexium 40 Mg Cpdr (Esomeprazole magnesium) .... Take 1 tablet by mouth once a day (30 min before the first meal of the day) 6)  Zantac 75 75 Mg Tabs (Ranitidine hcl) .... Take 1-2 tab by mouth at bedtime 7)  Vitamin D (ergocalciferol) 50000 Unit Caps (Ergocalciferol) .... Take one by mouth once a week  Patient Instructions: 1)  Today we updated your med list- see below.... 2)  Continue your current meds the same for now & I encourage you to use the Advair twice daily & take the anti-reflux meds regularly as well... 3)  Today we did your follow up CXR, PFT, & fasting blood work... please call the "phone tree" in a few days for your lab results.Marland KitchenMarland Kitchen 4)  Let's get on track w/ our diet & exercise  program... the goal is to lose 10-15 lbs over the next 6 months... 5)  We could consider referral to a pulmonary rehab/ exercise program  to help your breathing... 6)  Yoyu should reconsider the Flu shot & Pneumonia vaccines.Marland KitchenMarland Kitchen 7)  Call for any questions... 8)  Please schedule a follow-up appointment in 6 months.

## 2010-11-06 NOTE — Assessment & Plan Note (Signed)
Summary: rov-rx renewal////kp   Chief Complaint:  15 month ROV....  History of Present Illness: 55 y/o WF here for a follow up visit... she has multiple medical problems as noted below... she was followed by DrWelty-Wolfe at [facility] for her Asthma, then got switched to [facility], but both of them have left... I tried to get her to see Dr. Tod Persia but she has refused further referrals for her Asthma... the concern has been for the developement of fixed airway obstruction from her Asthma due to poor control and medication non-compliance in the past... she has been on ADVAIR 500/50 Bid but ran out recently; otherwise she uses Ventolin HFA as needed...   Current Problem List:  ALLERGIC RHINITIS (ICD-477.9) - ++allergy testing by DrESL in the 1980's w/ reactions to grasses, ragweed, dust, molds, cats, dog... prev on shots...  ASTHMA (ICD-493.90) - on ADVAIR 500Bid, & VENTOLIN HFA as needed... she wants refills today.  ~  baseline CXR shows sl incr markings, NAD...  ~  PFT's 5/05 showed FVC= 2.47 (76%), FEV1= 1.51 (56%), %1sec=61, mid-flows= 22%pred...  ~  in 2006 she entered the EXTRA trial at [facility] which was a double blind trial testing XOLAIR...  ~  last note from [facility] 6/07 DrCarraway w/ concern for her fixed airway obstruction... pt refused f/u in the Asthma Center...  HYPERLIPIDEMIA (ICD-272.4) - prev on Zetia, on diet alone at present... followed by DrDoerr, we have not had any labs on her since the late 90's...  HYPOTHYROIDISM (ICD-244.9) - taking ARMOUR THYROID 60mg /d... she is followed by an Actor, DrDoerr in [place] for thyroid and her hyperlipidemia... she was prev on Levothyroid and had requested change to Armour Thyroid last year...  OVERWEIGHT (ICD-278.02) - weight= 199# today, no change in the last yr...  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 she continues to treat her reflux as needed w/ the Nexium and OTC Zantac even though we have  felt this plays a signif roll in her Asthma... the consultants at [facility] agreed, but were no more effective at getting her to take her meds regularly... 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  OSTEOARTHRITIS (ICD-715.90) - she had a left knee arthroscopy by drPaul in 1999...  ANXIETY (ICD-300.00)  GYN = DrCMatthews/ [facility]... she has also checked labs on her periodically.      Current Allergies (reviewed today): No known allergies   Past Medical History:        ALLERGIC RHINITIS (ICD-477.9)    ASTHMA (ICD-493.90)    HYPERLIPIDEMIA (ICD-272.4)    HYPOTHYROIDISM (ICD-244.9)    OVERWEIGHT (ICD-278.02)    HIATAL HERNIA WITH REFLUX (ICD-553.3)    OSTEOARTHRITIS (ICD-715.90)    ANXIETY (ICD-300.00)      Past Surgical History:    S/P left knee arthroscopy in 1999 by DrPaul    S/P breast reductions in 2000 in [place]    Risk Factors:  Tobacco use:  never   Review of Systems       The patient complains of dyspnea on exertion.  The patient denies anorexia, fever, weight loss, weight gain, vision loss, decreased hearing, hoarseness, chest pain, syncope, peripheral edema, prolonged cough, headaches, hemoptysis, abdominal pain, melena, hematochezia, severe indigestion/heartburn, hematuria, incontinence, muscle weakness, suspicious skin lesions, transient blindness, difficulty walking, depression, unusual weight change, abnormal bleeding, enlarged lymph nodes, and angioedema.     Vital Signs:  Patient Profile:   55 Years Old Female  Weight:      199.13 pounds O2 Sat:      97 % O2 treatment:    Room Air Temp:     99.4 degrees F oral Pulse rate:   72 / minute BP sitting:   142 / 98  (right arm)  Vitals Entered By: Marijo File CMA (April 13, 2008 2:11 PM)                 Physical Exam  WD, sl overweight, 55 y/o WF in NAD... GENERAL:  Alert & oriented; pleasant & cooperative... HEENT:  [place]/AT,  EOM-wnl, PERRLA, Fundi-benign, EACs-clear, TMs-wnl, NOSE-clear, THROAT-clear & wnl. NECK:  Supple w/ full ROM; no JVD; normal carotid impulses w/o bruits; no thyromegaly or nodules palpated; no lymphadenopathy. CHEST:  Clear to P & A; decr BS bilat; without wheezes/ rales/ or rhonchi heard... HEART:  Regular Rhythm; without murmurs/ rubs/ or gallops detected... ABDOMEN:  Soft & nontender; normal bowel sounds; no organomegaly or masses palpated... EXT: without deformities, mild arthritic changes; no varicose veins/ venous insuffic/ or edema. NEURO:  CN's intact; no focal neuro deficits... DERM:  No lesions noted; no rash etc...        Impression & Recommendations:  Problem # 1:  ALLERGIC RHINITIS (ICD-477.9) Assessment: Unchanged She states she is managing satis w/ OTC meds...  Problem # 2:  ASTHMA (ICD-493.90) Assessment: Unchanged We again discussed her mod persistant asthma and the need for better control... we discussed her fixed airway obstructive disease and implications for the future... she just wants her current meds refilled and states that she is doing well... refuses referral to the [facility] ... Her updated medication list for this problem includes:    Advair Diskus 500-50 Mcg/dose Misc (Fluticasone-salmeterol) .Marland Kitchen... 1 puff two times a day (rinse mouth after each use)    Ventolin Hfa 108 (90 Base) Mcg/act Aers (Albuterol sulfate) .Marland Kitchen... 1-2 sprays q 4 h as needed for wheezing...   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  Problem # 4:  HYPOTHYROIDISM (ICD-244.9) Assessment: Unchanged She will f/u w/ [place]Doerr in [place]... Her updated medication list for this problem includes:    Armour Thyroid 60 Mg Tabs (Thyroid) .Marland Kitchen... Take 1 tablet by mouth once a day   Problem # 5:  HYPERLIPIDEMIA (ICD-272.4) Assessment:  Unchanged She will f/u w/ drDoerr in [place]... The following medications were removed from the medication list:    Zetia 10 Mg Tabs (Ezetimibe) ..... Once daily   Complete Medication List: 1)  Advair Diskus 500-50 Mcg/dose Misc (Fluticasone-salmeterol) .Marland Kitchen.. 1 puff two times a day (rinse mouth after each use) 2)  Ventolin Hfa 108 (90 Base) Mcg/act Aers (Albuterol sulfate) .Marland Kitchen.. 1-2 sprays q 4 h as needed for wheezing... 3)  Armour Thyroid 60 Mg Tabs (Thyroid) .... Take 1 tablet by mouth once a day 4)  Nexium 40 Mg Cpdr (Esomeprazole magnesium) .... Take 1 tablet by mouth once a day 5)  Anti-oxidant Tabs (Multiple vitamin) .... Once daily   Patient Instructions: 1)  Today we updated your med list- see below... 2)  We also refilled your Advair, Ventolin, and Nexium per request... 3)  I would like you to see Dr Tod Persia at the [facility]... please let me know if you would like [place] to set up an appt for you.Marland KitchenMarland Kitchen 4)  Call for any problems...   Prescriptions: NEXIUM 40 MG  CPDR (ESOMEPRAZOLE MAGNESIUM) Take 1 tablet by mouth once a day  #30 x prn   Entered and Authorized by:   Michele Mcalpine MD   Signed by:   Michele Mcalpine MD on 04/13/2008   Method used:   Print then Give to Patient   RxID:   [id number] VENTOLIN HFA 108 (90 BASE) MCG/ACT  AERS (ALBUTEROL SULFATE) 1-2 sprays Q 4 H as needed for wheezing...  #1 x prn   Entered and Authorized by:   Michele Mcalpine MD   Signed by:   Michele Mcalpine MD on 04/13/2008   Method used:   Print then Give to Patient   RxID:   [id number] ADVAIR DISKUS 500-50 MCG/DOSE  MISC (FLUTICASONE-SALMETEROL) 1 puff two times a day (rinse mouth after each use)  #1 x prn   Entered and Authorized by:   Michele Mcalpine MD   Signed by:   Michele Mcalpine MD on 04/13/2008   Method used:   Print then Give to Patient   RxID:   [id number]  ]  Appended Document: Orders Update    Clinical Lists Changes  Orders: Added new Service order of Est.  Patient Level IV (01027) - Signed

## 2010-11-06 NOTE — Progress Notes (Signed)
Summary: PAIN  Phone Note Call from Patient   Caller: Patient Call For: parrett Summary of Call: pain in left side was told to call back if no better by tammy p Initial call taken by: Rickard Patience,  September 08, 2008 8:31 AM  Follow-up for Phone Call        needs ov can see me today or nadel on fri pm (per leigh w.) Follow-up by: Rubye Oaks NP,  September 08, 2008 9:31 AM  Additional Follow-up for Phone Call Additional follow up Details #1::        ov with tp today @ 2pm Additional Follow-up by: Philipp Deputy CMA,  September 08, 2008 10:45 AM

## 2010-11-06 NOTE — Miscellaneous (Signed)
Summary: Orders Update  Clinical Lists Changes  Medications: Added new medication of VALTREX 1 GM TABS (VALACYCLOVIR HCL) 1 by mouth three times a day - Signed Added new medication of PREDNISONE 10 MG TABS (PREDNISONE) 4 tabs for 2 days, then 3 tabs for 2 days, 2 tabs for 2 days, then 1 tab for 2 days, then stop - Signed Rx of VALTREX 1 GM TABS (VALACYCLOVIR HCL) 1 by mouth three times a day;  #21 x 0;  Signed;  Entered by: Ritha Sampedro NP;  Authorized by: Travaughn Vue NP;  Method used: Electronically to [facility] [address] [place], [place], [place]  [address], Ph: [phone], Fax: [phone] Rx of PREDNISONE 10 MG TABS (PREDNISONE) 4 tabs for 2 days, then 3 tabs for 2 days, 2 tabs for 2 days, then 1 tab for 2 days, then stop;  #20 x 0;  Signed;  Entered by: [name] Spratling NP;  Authorized by: Rubye Oaks NP;  Method used: Electronically to [facility] [address] [place], [place], [place]  [address], Ph: [phone], Fax: [phone] Rx of SKELAXIN 800 MG TABS (METAXALONE) 1 by mouth three times a day as needed;  #30 x 0;  Signed;  Entered by: Desarae Placide NP;  Authorized by: Rubye Oaks NP;  Method used: Electronically to [facility] [address] [place], [place], [place]  [address], Ph: [phone], Fax: [phone]    Prescriptions: SKELAXIN 800 MG TABS (METAXALONE) 1 by mouth three times a day as needed  #30 x 0   Entered and Authorized by:   Rubye Oaks NP   Signed by:   Nitya Cauthon NP on 09/13/2008   Method used:   Electronically to        [facility] (retail)       [address]       [place], [place]  [address]       Ph: [phone]       Fax: [phone]   RxID:   [id number] PREDNISONE 10 MG TABS (PREDNISONE) 4 tabs for 2 days, then 3 tabs for 2 days, 2 tabs for 2 days, then 1 tab for 2 days, then stop  #20 x 0   Entered and Authorized by:   Rubye Oaks NP   Signed by:   Lilianne Delair NP on 09/13/2008   Method  used:   Electronically to        [facility] (retail)       [address]       [place], [place]  [address]       Ph: [phone]       Fax: [phone]   RxID:   [id number] VALTREX 1 GM TABS (VALACYCLOVIR HCL) 1 by mouth three times a day  #21 x 0   Entered and Authorized by:   Rubye Oaks NP   Signed by:   Jamyia Fortune NP on 09/13/2008   Method used:   Electronically to        [facility] (retail)       [address]       [place], [place]  [address]       Ph: [phone]       Fax: [phone]   RxID:   [id number]

## 2010-11-06 NOTE — Assessment & Plan Note (Signed)
Summary: rov with labs//jrc   CC:  7 month ROV & review of mult medical issues....  History of Present Illness: 55 y/o WF here for a follow up visit... she has multiple medical problems as noted below... she was followed by DrWelty-Wolfe at [facility] for her Asthma, then got switched to [facility], but both of them have left... I tried to get her to see Dr. Tod Persia but pt has refused further referrals for her Asthma... the concern has been for the developement of fixed airway obstruction from her Asthma due to poor control and medication non-compliance in the past...    ~  June 22, 2009:  last seen 7/09 for med refills & refused referral to the [facility]... states that she's had a good year x for left side/ flank pain in 12/09 that turned out to be Shingles- Rx w/ Valtrex/ Pred & finally resolved... states she's been using her Advair500 twice daily + rescue inhaler several times per week... notes mild exas over the last 2 weeks- denies infection, etc... we discussed Rx w/ Medrol Dosepak & given samples of Symbicort/ Dulera to see if they work any better.   ~  January 12, 2010:  she requests that we start following her Chol & Thyroid since her LMD DrDoerr (Endocrine) from [facility] has left... pt was taking Simva40 & Armour Thyroid 60mg  but admits to intermittent dosing & hasn't taken in some time she says... similarly she is only using the Advair once daily & only uses the Nexium Prn for pain... she is not fasting today & we decided to refill meds as she requested, encourage regular dosing of all medications, and ROV later in the fasting state for blood work.    Current Problem List:  ALLERGIC RHINITIS (ICD-477.9) - ++allergy testing by DrESL in the 1980's w/ reactions to grasses, ragweed, dust, molds, cats, dog... prev on shots...  ASTHMA (ICD-493.90) - on ADVAIR 500Bid, & VENTOLIN HFA as needed... she admits to using Advair once dailly & is encouraged to take meds regularly as  perscribed...  ~  baseline CXR shows sl incr markings, NAD...  ~  PFT's 5/05 showed FVC= 2.47 (76%), FEV1= 1.51 (56%), %1sec=61, mid-flows= 22%pred...  ~  in 2006 she entered the EXTRA trial at [facility] which was a double blind trial testing XOLAIR...  ~  last note from [facility] 6/07 DrCarraway w/ concern for her fixed airway obstruction... pt refused f/u in the Asthma Center...  ~  last CXR here 12/09 showed min left lung atx, no rib fx etc, NAD...  HYPERLIPIDEMIA (ICD-272.4) - prev followed by DrDoerr, Endocrinology in [place]... prev on Zetia, now on SIMVASTATIN 40mg /d... pt doesn't know her numbers and we don't have records from DrD... we have not had any labs on her since the late 90's...  ~  4/11: pt presents w/ request to start writing Simva Rx & monitor labs since DrDoerr has left [facility]... pt encouraged to take med regularly Qhs & follow low chol/ low fat diet...  HYPOTHYROIDISM (ICD-244.9) - taking ARMOUR THYROID 60mg /d... she is followed by DrDoerr, Endocrine in [place] for thyroid and her hyperlipidemia... she was prev on Levothyroid and had requested change to Armour Thyroid last year because she believed the Levothy was making her hair fall out...  ~  4/11:  pt reqested that we write her Rx for the Armour Thyroid 60mg  since DrDoerr has left [facility]... pt is uncouraged to take the med daily.  OVERWEIGHT (ICD-278.02) - we reviewed  diet + exercise program required to lose weight...  ~  weight 9/10 = 200#,  she is 5\' 5"  tall,  BMI= 34.  ~  weight 4/11 = 192#,  this is as good as she's been in several yrs.  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 she continues to treat her reflux as needed w/ the Nexium and OTC Zantac even though we have felt this plays a signif roll in her Asthma... the consultants at [facility] agreed, but were no more effective at getting her to take her meds regularly... 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  ~  4/11:  meds re-written & encouraged to take Nexium 40mg /d- 30 min before the 1st meal, and Zantac 75- 1-2 at bedtime...  GYN = DrRomaine in the past... pt states that she stopped going because she couldn't help her w/ her hot flashes.  OSTEOARTHRITIS (ICD-715.90) - she had a left knee arthroscopy by DrPaul in 1999...  VITAMIN D DEFICIENCY (ICD-268.9) - pt takes Vit D 50,000 u weekly from DrDoerr... we don't have prev labs from her but pt indicates that the Vit D level was "really really low" so we will refill the Rx & f/u Vit D level later.  ANXIETY (ICD-300.00)  Hx of SHINGLES (ICD-053.9) - she presented w/ cryptic left flank pain 12/09 that turned out to be Shingles... treated w/ Valtrex, Pred, Vicodin. NOTE:  pt refuses Flu shots and Vaccinations...   Allergies (verified): No Known Drug Allergies  Comments:  Nurse/Medical Assistant: The patient's medications and allergies were reviewed with the patient and were updated in the Medication and Allergy Lists.  Past History:  Past Medical History: ALLERGIC RHINITIS (ICD-477.9) ASTHMA (ICD-493.90) HYPERLIPIDEMIA (ICD-272.4) HYPOTHYROIDISM (ICD-244.9) OVERWEIGHT (ICD-278.02) HIATAL HERNIA WITH REFLUX (ICD-553.3) OSTEOARTHRITIS (ICD-715.90) VITAMIN D DEFICIENCY (ICD-268.9) ANXIETY (ICD-300.00) Hx of SHINGLES (ICD-053.9)  Past Surgical History: S/P left knee arthroscopy in 1999 by DrPaul S/P breast reductions in 2000 in [place]  Family History: Father died age 56 w/ heart dis, kidney dis, hx asthma. Mother alive age 95 w/ hx chr leukemia 2 Siblings: 1 Brother in good health 1 Sister in good heath  Social History: Reviewed history and no changes required.  Review of Systems      See HPI       The patient complains of dyspnea on exertion.  The patient denies anorexia, fever, weight loss, weight gain, vision loss, decreased hearing, hoarseness,  chest pain, syncope, peripheral edema, prolonged cough, headaches, hemoptysis, abdominal pain, melena, hematochezia, severe indigestion/heartburn, hematuria, incontinence, muscle weakness, suspicious skin lesions, transient blindness, difficulty walking, depression, unusual weight change, abnormal bleeding, enlarged lymph nodes, and angioedema.    Vital Signs:  Patient profile:   55 year old female Height:      65 inches Weight:      191.50 pounds BMI:     31.98 O2 Sat:      98 % on Room air Temp:     98.5 degrees F oral Pulse rate:   77 / minute BP sitting:   126 / 90  (right arm) Cuff size:   regular  Vitals Entered By: Randell Loop CMA (January 12, 2010 11:13 AM)  O2 Sat at Rest %:  99 O2 Flow:  Room air CC: 7 month ROV & review of mult medical issues... Is Patient Diabetic? No Pain  Assessment Patient in pain? yes      Comments meds updated today   Physical Exam  Additional Exam:  WD, overweight, 55 y/o WF in NAD... GENERAL:  Alert & oriented; pleasant & cooperative... HEENT:  [place]/AT, EOM-wnl, PERRLA, Fundi-benign, EACs-clear, TMs-wnl, NOSE-clear, THROAT-clear & wnl. NECK:  Supple w/ full ROM; no JVD; normal carotid impulses w/o bruits; no thyromegaly or nodules palpated; no lymphadenopathy. CHEST:  Clear to P & A; decr BS bilat; without wheezes/ rales/ or rhonchi heard... HEART:  Regular Rhythm; without murmurs/ rubs/ or gallops detected... ABDOMEN:  Soft & nontender; normal bowel sounds; no organomegaly or masses palpated... EXT: without deformities, mild arthritic changes; no varicose veins/ venous insuffic/ or edema. NEURO:  CN's intact; no focal neuro deficits... DERM:  No lesions noted; no rash etc...    Impression & Recommendations:  Problem # 1:  ASTHMA (ICD-493.90) She is encouraged to take her controller med= Advair Bid regularly... The following medications were removed from the medication list:    Medrol (pak) 4 Mg Tabs (Methylprednisolone) .Marland Kitchen... Take as  directed... Her updated medication list for this problem includes:    Advair Diskus 500-50 Mcg/dose Misc (Fluticasone-salmeterol) .Marland Kitchen... 1 puff two times a day (rinse mouth after each use)    Ventolin Hfa 108 (90 Base) Mcg/act Aers (Albuterol sulfate) .Marland Kitchen... 1-2 sprays q 4 h as needed for wheezing...  Problem # 2:  HYPERLIPIDEMIA (ICD-272.4) She is encouraged to take her Simva40 every night, + low chol/ low fat diet... Her updated medication list for this problem includes:    Simvastatin 40 Mg Tabs (Simvastatin) .Marland Kitchen... Take one by mouth once daily  Problem # 3:  HYPOTHYROIDISM (ICD-244.9) She hasn't been taking the thyroid medication regularly, & is requested to do so going forward... Her updated medication list for this problem includes:    Armour Thyroid 60 Mg Tabs (Thyroid) .Marland Kitchen... Take 1 tablet by mouth once a day  Problem # 4:  OVERWEIGHT (ICD-278.02) Diet & exercise are the keys to success...  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 Her updated medication list for this problem includes:    Nexium 40 Mg Cpdr (Esomeprazole magnesium) .Marland Kitchen... Take 1 tablet by mouth once a day (30 min before the first meal of the day)    Zantac 75 75 Mg Tabs (Ranitidine hcl) .Marland Kitchen... Take 1-2 tab by mouth at bedtime  Problem # 6:  VITAMIN D DEFICIENCY (ICD-268.9) We will f/u Vit D level w/ her other labs on ret...  Problem # 7:  ANXIETY (ICD-300.00) Under alot of stress (father died recently & Bro-in-law died w/ pancreatic Ca at age 37 recently... Offered anxiolytic Rx but she declines...  Complete Medication List: 1)  Advair Diskus 500-50 Mcg/dose Misc (Fluticasone-salmeterol) .Marland Kitchen.. 1 puff two times a day (rinse mouth after each use) 2)  Ventolin Hfa 108 (90 Base) Mcg/act Aers (Albuterol sulfate) .Marland Kitchen.. 1-2 sprays q 4 h as needed for wheezing... 3)  Simvastatin 40 Mg Tabs (Simvastatin) .... Take one by mouth once daily 4)   Armour Thyroid 60 Mg Tabs (Thyroid) .... Take 1 tablet by mouth once a day 5)  Nexium 40 Mg Cpdr (Esomeprazole magnesium) .... Take 1 tablet by mouth once a day (30 min before the first meal of the day) 6)  Zantac 75 75 Mg Tabs (Ranitidine hcl) .... Take 1-2 tab by mouth at bedtime 7)  Vitamin D (ergocalciferol) 50000 Unit Caps (Ergocalciferol) .Marland KitchenMarland KitchenMarland Kitchen  Take one by mouth once a week  Other Orders: Prescription Created Electronically [phone])  Patient Instructions: 1)  Today we updated your med list- see below.... 2)  We wrote new persriptions for 2011 as requested... 3)  Let's plan a follow up visit in 6 months w/ FASTING blood work at that time.Marland KitchenMarland Kitchen 4)  Call for any problems... Prescriptions: VITAMIN D (ERGOCALCIFEROL) 50000 UNIT CAPS (ERGOCALCIFEROL) Take one by mouth once a week  #4 x prn   Entered and Authorized by:   Michele Mcalpine MD   Signed by:   Michele Mcalpine MD on 01/12/2010   Method used:   Print then Give to Patient   RxID:   [id number] NEXIUM 40 MG  CPDR (ESOMEPRAZOLE MAGNESIUM) Take 1 tablet by mouth once a day (30 min before the first meal of the day)  #30 x prn   Entered and Authorized by:   Michele Mcalpine MD   Signed by:   Michele Mcalpine MD on 01/12/2010   Method used:   Print then Give to Patient   RxID:   [id number] ARMOUR THYROID 60 MG  TABS (THYROID) Take 1 tablet by mouth once a day  #30 x prn   Entered and Authorized by:   Michele Mcalpine MD   Signed by:   Michele Mcalpine MD on 01/12/2010   Method used:   Print then Give to Patient   RxID:   [id number] SIMVASTATIN 40 MG TABS (SIMVASTATIN) Take one by mouth once daily  #30 x prn   Entered and Authorized by:   Michele Mcalpine MD   Signed by:   Michele Mcalpine MD on 01/12/2010   Method used:   Print then Give to Patient   RxID:   [id number] VENTOLIN HFA 108 (90 BASE) MCG/ACT  AERS (ALBUTEROL SULFATE) 1-2 sprays Q 4 H as needed for wheezing...  #1 x prn   Entered and Authorized by:   Michele Mcalpine MD    Signed by:   Michele Mcalpine MD on 01/12/2010   Method used:   Print then Give to Patient   RxID:   [id number] ADVAIR DISKUS 500-50 MCG/DOSE  MISC (FLUTICASONE-SALMETEROL) 1 puff two times a day (rinse mouth after each use)  #1 x prn   Entered and Authorized by:   Michele Mcalpine MD   Signed by:   Michele Mcalpine MD on 01/12/2010   Method used:   Print then Give to Patient   RxID:   [id number]

## 2010-11-06 NOTE — Progress Notes (Signed)
Summary: prior auth for nexium  Phone Note Other Incoming   Summary of Call: prior auth for nexium-- waiting on form to be faxed over Randell Loop [facility]  July 16, 2010 10:33 AM   Follow-up for Phone Call        form received and given to [facility] . Carron Curie CMA  July 17, 2010 11:54 AM   Additional Follow-up for Phone Call Additional follow up Details #1::        prior auth for nexium has been approved and form has been scanned into pts chart Randell Loop [facility]  July 19, 2010 3:37 PM      Appended Document: prior auth for nexium pt and pharmacy informed. jwr

## 2010-11-06 NOTE — Assessment & Plan Note (Signed)
Summary: f/u ///kp   Chief Complaint:  follow up for shingles - states has improved.  History of Present Illness: 55 y/o WF with known history of AR, Asthma, Hyperlipidemia, and hypothyroidism. Was followed by DrWelty-Wolfe at [facility] for her Asthma, then got switched to [facility], but both of them have left... I tried to get her to see Dr. Tod Persia but she has refused further referrals for her Asthma... the concern has been for the developement of fixed airway obstruction from her Asthma due to poor control and medication non-compliance in the past... she has been on ADVAIR 500/50 Bid but not taken on consistent basis.    September 05, 2008--Presents for an acute office  visit with left lower back pain. Started 3 days ago with throbbing pain in left lower back wtih radiation to hip/side. Severe at times, "waves of pain", no urinary symptoms,  . Pain at all times but worse with bending and tender at times. took vicoidin with no help.--UA neg Urine cx neg. KUB neg. tx for musculoskeletal -nsaids/skelaxin and vicodin-heat/ice  September 08, 2008 --Returns with persistent left back/flank pain with radiation to side/ribs/hip. nsaids/vicodin/skelaxin helped but only takes edge off. Still very severe, throbbing pain that comes in waves, very tender over left flank and lateral ribs. increased pain with sitting . --CT abd/pelvis neg for kidney stone.   September 27, 2008--returns for follow up. Pt called office on 09/13/2008 with new development of rash. She stopped by office  and dx with herpes zoster. Rx valtrex and prednisone taper. Rash and pain is much better. Doing well. Denies chest pain, dyspnea, orthopnea, hemoptysis, fever, n/v/d, edema.     Prior Medications Reviewed Using: Patient Recall  Updated Prior Medication List: ADVAIR DISKUS 500-50 MCG/DOSE  MISC (FLUTICASONE-SALMETEROL) 1 puff two times a day (rinse mouth after each use) VENTOLIN HFA 108 (90 BASE) MCG/ACT  AERS (ALBUTEROL  SULFATE) 1-2 sprays Q 4 H as needed for wheezing... ARMOUR THYROID 60 MG  TABS (THYROID) Take 1 tablet by mouth once a day NEXIUM 40 MG  CPDR (ESOMEPRAZOLE MAGNESIUM) Take 1 tablet by mouth once a day ANTI-OXIDANT   TABS (MULTIPLE VITAMIN) once daily VICODIN 5-500 MG TABS (HYDROCODONE-ACETAMINOPHEN) 1 every 4 to 6 hours as needed for pain SKELAXIN 800 MG TABS (METAXALONE) 1 by mouth three times a day as needed IBUPROFEN 800 MG TABS (IBUPROFEN) 1 by mouth three times a day with food for 7 days  Current Allergies (reviewed today): No known allergies   Past Medical History:    Reviewed history from 09/05/2008 and no changes required:       ALLERGIC RHINITIS (ICD-477.9) - ++allergy testing by DrESL in the 1980's w/ reactions to grasses, ragweed, dust, molds, cats, dog... prev on shots...              ASTHMA (ICD-493.90) - on ADVAIR 500Bid, & VENTOLIN HFA as needed... she wants refills today.        ~  baseline CXR shows sl incr markings, NAD...        ~  PFT's 5/05 showed FVC= 2.47 (76%), FEV1= 1.51 (56%), %1sec=61, mid-flows= 22%pred...        ~  in 2006 she entered the EXTRA trial at [facility] which was a double blind trial testing XOLAIR...        ~  last note from [facility] 6/07 DrCarraway w/ concern for her fixed airway obstruction... pt refused f/u in the Asthma Center.Marland KitchenMarland Kitchen  HYPERLIPIDEMIA (ICD-272.4) - prev on Zetia, on diet alone at present... followed by DrDoerr, we have not had any labs on her since the late 90's...              HYPOTHYROIDISM (ICD-244.9) - taking ARMOUR THYROID 60mg /d... she is followed by an Actor, DrDoerr in [place] for thyroid and her hyperlipidemia... she was prev on Levothyroid and had requested change to Armour Thyroid last year...              OVERWEIGHT (ICD-278.02) - weight= 199# today, no change in the last yr...                  Family History:    Reviewed history and no changes required:  Social History:    Reviewed history and no  changes required:   Risk Factors: Tobacco use:  never   Review of Systems      See HPI   Vital Signs:  Patient Profile:   55 Years Old Female Weight:      197.38 pounds O2 Sat:      100 % O2 treatment:    Room Air Temp:     97.9 degrees F oral Pulse rate:   68 / minute BP sitting:   120 / 66  (left arm) Cuff size:   regular  Vitals Entered By: Boone Master CNA (September 27, 2008 12:18 PM)             Is Patient Diabetic? No Comments Medications reviewed with patient Boone Master CNA  September 27, 2008 12:19 PM      Physical Exam  WD, sl overweight, 55 y/o WF in NAD... GENERAL:  Alert & oriented; pleasant & cooperative... HEENT:  [place]/AT, EOM-wnl, PERRLA, Fundi-benign, EACs-clear, TMs-wnl, NOSE-clear, THROAT-clear & wnl. NECK:  Supple w/ full ROM; no JVD; normal carotid impulses w/o bruits; no thyromegaly or nodules palpated; no lymphadenopathy. CHEST:  Clear to P & A; decr BS bilat; without wheezes/ rales/ or rhonchi heard... HEART:  Regular Rhythm; without murmurs/ rubs/ or gallops detected... ABDOMEN:  Soft & nontender; normal bowel sounds; no organomegaly or masses palpated...no guarding or rebound  EXT: without deformities, mild arthritic changes; no varicose veins/ venous insuffic/ or edema. Skin : along left lat side/LLQ patch of crusted lesions.         Impression & Recommendations:  Problem # 1:  HERPES ZOSTER (ICD-053.9) improving. no further tx at this time tylenol as needed  Please contact office for sooner follow up if symptoms do not improve or worsen  Orders: Est. Patient Level II ([address])   Complete Medication List: 1)  Advair Diskus 500-50 Mcg/dose Misc (Fluticasone-salmeterol) .Marland Kitchen.. 1 puff two times a day (rinse mouth after each use) 2)  Ventolin Hfa 108 (90 Base) Mcg/act Aers (Albuterol sulfate) .Marland Kitchen.. 1-2 sprays q 4 h as needed for wheezing... 3)  Armour Thyroid 60 Mg Tabs (Thyroid) .... Take 1 tablet by mouth once a day 4)  Nexium 40 Mg  Cpdr (Esomeprazole magnesium) .... Take 1 tablet by mouth once a day 5)  Anti-oxidant Tabs (Multiple vitamin) .... Once daily 6)  Vicodin 5-500 Mg Tabs (Hydrocodone-acetaminophen) .Marland Kitchen.. 1 every 4 to 6 hours as needed for pain 7)  Skelaxin 800 Mg Tabs (Metaxalone) .Marland Kitchen.. 1 by mouth three times a day as needed 8)  Ibuprofen 800 Mg Tabs (Ibuprofen) .Marland Kitchen.. 1 by mouth three times a day with food for 7 days   Patient Instructions: 1)  Please contact office for sooner follow  up if symptoms do not improve or worsen    ]

## 2010-11-06 NOTE — Assessment & Plan Note (Signed)
Summary: f/u ///kp   CC:  14 month ROV & review of mult medical problems....  History of Present Illness: 55 y/o WF here for a follow up visit... she has multiple medical problems as noted below... she was followed by DrWelty-Wolfe at [facility] for her Asthma, then got switched to [facility], but both of them have left... I tried to get her to see Dr. Tod Persia but pt has refused further referrals for her Asthma... the concern has been for the developement of fixed airway obstruction from her Asthma due to poor control and medication non-compliance in the past...    ~  June 22, 2009:  last seen 7/09 for med refills & refused referral to the [facility]... states that she's had a good year x for left side/ flank pain in 12/09 that turned out to be Shingles- Rx w/ Valtrex/ Pred & finally resolved... states she's been using her Advair500 twice daily + rescue inhaler several times per week... notes mild exas over the last 2 weeks- denies infection, etc... we discussed Rx w/ Medrol Dosepak & given samples of Symbicort/ Dulera to see if they work any better.    Current Problem List:  ALLERGIC RHINITIS (ICD-477.9) - ++allergy testing by DrESL in the 1980's w/ reactions to grasses, ragweed, dust, molds, cats, dog... prev on shots...  ASTHMA (ICD-493.90) - on ADVAIR 500Bid, & VENTOLIN HFA as needed... she wants refills today.  ~  baseline CXR shows sl incr markings, NAD...  ~  PFT's 5/05 showed FVC= 2.47 (76%), FEV1= 1.51 (56%), %1sec=61, mid-flows= 22%pred...  ~  in 2006 she entered the EXTRA trial at [facility] which was a double blind trial testing XOLAIR...  ~  last note from [facility] 6/07 DrCarraway w/ concern for her fixed airway obstruction... pt refused f/u in the Asthma Center...  HYPERLIPIDEMIA (ICD-272.4) - followed by DrDoerr, Endocrinology in [place]... prev on Zetia, now on SIMVASTATIN 40mg /d... pt doesn't know her numbers and we don't have records from DrD... we have not had any  labs on her since the late 90's...  HYPOTHYROIDISM (ICD-244.9) - taking ARMOUR THYROID 60mg /d... she is followed by an DrDoerr, Endocrine in [place] for thyroid and her hyperlipidemia... she was prev on Levothyroid and had requested change to Armour Thyroid last year...  OVERWEIGHT (ICD-278.02) - weight= 200# today, no change in the last yr...  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 she continues to treat her reflux as needed w/ the Nexium and OTC Zantac even though we have felt this plays a signif roll in her Asthma... the consultants at [facility] agreed, but were no more effective at getting her to take her meds regularly... 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  GYN = DrRomaine in the past... pt states that she stopped going because she couldn't help her w/ her hot flashes  OSTEOARTHRITIS (ICD-715.90) - she had a left knee arthroscopy by DrPaul in 1999...  VITAMIN D DEFICIENCY (ICD-268.9) - pt takes Vit D 50,000 u weekly from DrDoerr.  ANXIETY (ICD-300.00)  Hx of SHINGLES (ICD-053.9) - she presented w/ cryptic left flank pain 12/09 that turned out to be Shingles... treated w/ Valtrex, Pred, Vicodin.     Allergies (verified): No Known Drug Allergies  Comments:  Nurse/Medical Assistant: The patient's medications and allergies were reviewed with the patient and were updated in the Medication and Allergy Lists.  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  Social History: Reviewed history and no changes required.  Review of Systems      See HPI       The patient complains of dyspnea on exertion.  The patient denies anorexia, fever, weight loss, weight gain, vision loss, decreased hearing, hoarseness, chest pain, syncope, peripheral edema, prolonged cough, headaches, hemoptysis, abdominal pain, melena, hematochezia, severe indigestion/heartburn, hematuria, incontinence, muscle weakness, suspicious skin lesions, transient blindness, difficulty walking, depression, unusual weight change, abnormal bleeding, enlarged lymph nodes, and angioedema.    Vital Signs:  Patient profile:   55 year old female Height:      65 inches Weight:      200.13 pounds BMI:     33.42 O2 Sat:      95 % on Room air Temp:     98.4 degrees F oral Pulse rate:   86 / minute BP sitting:   126 / 84  (left arm) Cuff size:   large  Vitals Entered By: Abigail Miyamoto RN (June 22, 2009 11:45 AM)  O2 Flow:  Room air  Physical Exam  Additional Exam:  WD, overweight, 55 y/o WF in NAD... GENERAL:  Alert & oriented; pleasant & cooperative... HEENT:  [place]/AT, EOM-wnl, PERRLA, Fundi-benign, EACs-clear, TMs-wnl, NOSE-clear, THROAT-clear & wnl. NECK:  Supple w/ full ROM; no JVD; normal carotid impulses w/o bruits; no thyromegaly or nodules palpated; no lymphadenopathy. CHEST:  Clear to P & A; decr BS bilat; without wheezes/ rales/ or rhonchi heard... HEART:  Regular Rhythm; without murmurs/ rubs/ or gallops detected... ABDOMEN:  Soft & nontender; normal bowel sounds; no organomegaly or masses palpated... EXT: without deformities, mild arthritic changes; no varicose veins/ venous insuffic/ or edema. NEURO:  CN's intact; no focal neuro deficits... DERM:  No lesions noted; no rash etc...     Impression & Recommendations:  Problem # 1:  ASTHMA (ICD-493.90) We refilled her meds-  we also discussed trying Symbicort, & Dulera to see if they work any better for  her... Her updated medication list for this problem includes:    Advair Diskus 500-50 Mcg/dose Misc (Fluticasone-salmeterol) .Marland Kitchen... 1 puff two times a day (rinse mouth after each use)    Ventolin Hfa 108 (90 Base) Mcg/act Aers (Albuterol sulfate) .Marland Kitchen... 1-2 sprays q 4 h as needed for wheezing...    Medrol (pak) 4 Mg Tabs (Methylprednisolone) .Marland Kitchen... Take as directed...  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 Her updated medication list for this problem includes:    Nexium 40 Mg Cpdr (Esomeprazole magnesium) .Marland Kitchen... Take 1 tablet by mouth once a day (30 min before the first meal of the day)    Zantac 75 75 Mg Tabs (Ranitidine hcl) .Marland Kitchen... Take 1-2 tab by mouth at bedtime  Problem # 3:  HYPERLIPIDEMIA (ICD-272.4) Followed & treated by DrDoerr... Her updated medication list for this problem includes:    Simvastatin 40 Mg Tabs (Simvastatin) .Marland Kitchen... Take one by mouth once daily  Problem # 4:  HYPOTHYROIDISM (ICD-244.9) Followed & treated by DrDoerr... Her updated medication list for this problem includes:    Armour Thyroid  60 Mg Tabs (Thyroid) .Marland Kitchen... Take 1 tablet by mouth once a day  Problem # 5:  VITAMIN D DEFICIENCY (ICD-268.9) Followed & treated by DrDoerr...  Problem # 6:  Hx of SHINGLES (ICD-053.9) Resolved...  Complete Medication List: 1)  Advair Diskus 500-50 Mcg/dose Misc (Fluticasone-salmeterol) .Marland Kitchen.. 1 puff two times a day (rinse mouth after each use) 2)  Ventolin Hfa 108 (90 Base) Mcg/act Aers (Albuterol sulfate) .Marland Kitchen.. 1-2 sprays q 4 h as needed for wheezing... 3)  Simvastatin 40 Mg Tabs (Simvastatin) .... Take one by mouth once daily 4)  Armour Thyroid 60 Mg Tabs (Thyroid) .... Take 1 tablet by mouth once a day 5)  Nexium 40 Mg Cpdr (Esomeprazole magnesium) .... Take 1 tablet by mouth once a day (30 min before the first meal of the day) 6)  Zantac 75 75 Mg Tabs (Ranitidine hcl) .... Take  1-2 tab by mouth at bedtime 7)  Vitamin D (ergocalciferol) 50000 Unit Caps (Ergocalciferol) .... Take one by mouth once a week 8)  Anti-oxidant Tabs (Multiple vitamin) .... Once daily 9)  Medrol (pak) 4 Mg Tabs (Methylprednisolone) .... Take as directed...  Other Orders: Prescription Created Electronically [phone])  Patient Instructions: 1)  Today we updated your med list- see below.... 2)  We refilled your meds, and wrote a new perscription for a Medrol Dosepak as discussed... 3)  Try the Symbicort & Dulera inhalers to see if they work any better for you than the Advair... let me know if your prefer one of these over the Advair.Marland KitchenMarland Kitchen 4)  Call for any questions... Prescriptions: MEDROL (PAK) 4 MG TABS (METHYLPREDNISOLONE) take as directed...  #1 pack x 1   Entered and Authorized by:   Michele Mcalpine MD   Signed by:   Michele Mcalpine MD on 06/22/2009   Method used:   Print then Give to Patient   RxID:   [id number] NEXIUM 40 MG  CPDR (ESOMEPRAZOLE MAGNESIUM) Take 1 tablet by mouth once a day (30 min before the first meal of the day)  #30 x prn   Entered and Authorized by:   Michele Mcalpine MD   Signed by:   Michele Mcalpine MD on 06/22/2009   Method used:   Print then Give to Patient   RxID:   [id number] VENTOLIN HFA 108 (90 BASE) MCG/ACT  AERS (ALBUTEROL SULFATE) 1-2 sprays Q 4 H as needed for wheezing...  #1 x prn   Entered and Authorized by:   Michele Mcalpine MD   Signed by:   Michele Mcalpine MD on 06/22/2009   Method used:   Print then Give to Patient   RxID:   [id number] ADVAIR DISKUS 500-50 MCG/DOSE  MISC (FLUTICASONE-SALMETEROL) 1 puff two times a day (rinse mouth after each use)  #1 x prn   Entered and Authorized by:   Michele Mcalpine MD   Signed by:   Michele Mcalpine MD on 06/22/2009   Method used:   Print then Give to Patient   RxID:   [id number]

## 2011-01-15 ENCOUNTER — Ambulatory Visit: Payer: Self-pay | Admitting: Pulmonary Disease

## 2011-01-18 ENCOUNTER — Encounter: Payer: Self-pay | Admitting: Pulmonary Disease

## 2011-01-21 ENCOUNTER — Ambulatory Visit (INDEPENDENT_AMBULATORY_CARE_PROVIDER_SITE_OTHER): Payer: 59 | Admitting: Pulmonary Disease

## 2011-01-21 ENCOUNTER — Encounter: Payer: Self-pay | Admitting: Pulmonary Disease

## 2011-01-21 VITALS — BP 130/88 | HR 70 | Temp 98.7°F | Ht 65.0 in | Wt 200.4 lb

## 2011-01-21 DIAGNOSIS — Z23 Encounter for immunization: Secondary | ICD-10-CM

## 2011-01-21 DIAGNOSIS — E039 Hypothyroidism, unspecified: Secondary | ICD-10-CM

## 2011-01-21 DIAGNOSIS — K449 Diaphragmatic hernia without obstruction or gangrene: Secondary | ICD-10-CM

## 2011-01-21 DIAGNOSIS — E785 Hyperlipidemia, unspecified: Secondary | ICD-10-CM

## 2011-01-21 DIAGNOSIS — J45909 Unspecified asthma, uncomplicated: Secondary | ICD-10-CM

## 2011-01-21 DIAGNOSIS — E559 Vitamin D deficiency, unspecified: Secondary | ICD-10-CM

## 2011-01-21 DIAGNOSIS — E663 Overweight: Secondary | ICD-10-CM

## 2011-01-21 DIAGNOSIS — F411 Generalized anxiety disorder: Secondary | ICD-10-CM

## 2011-01-21 DIAGNOSIS — M1990 Unspecified osteoarthritis, unspecified site: Secondary | ICD-10-CM

## 2011-01-21 MED ORDER — ESOMEPRAZOLE MAGNESIUM 40 MG PO CPDR: 40.0000 mg | DELAYED_RELEASE_CAPSULE | Freq: Every day | ORAL | Status: DC

## 2011-01-21 MED ORDER — ALBUTEROL SULFATE HFA 108 (90 BASE) MCG/ACT IN AERS: 2.0000 | INHALATION_SPRAY | RESPIRATORY_TRACT | Status: DC | PRN

## 2011-01-21 MED ORDER — FLUTICASONE-SALMETEROL 500-50 MCG/DOSE IN AEPB: 1.0000 | INHALATION_SPRAY | Freq: Two times a day (BID) | RESPIRATORY_TRACT | Status: DC

## 2011-01-21 MED ORDER — SIMVASTATIN 40 MG PO TABS: 40.0000 mg | ORAL_TABLET | Freq: Every day | ORAL | Status: DC

## 2011-01-21 MED ORDER — TETANUS-DIPHTH-ACELL PERTUSSIS 5-2.5-18.5 LF-MCG/0.5 IM SUSP
0.5000 mL | Freq: Once | INTRAMUSCULAR | Status: AC
  Administered 2011-01-21: 0.5 mL via INTRAMUSCULAR

## 2011-01-21 MED ORDER — THYROID 60 MG PO TABS: ORAL_TABLET | ORAL | Status: DC

## 2011-01-21 NOTE — Patient Instructions (Signed)
Today we updated your med list in our EPIC system...    Continue your current meds the same, and you are encouraged to take the ADVAIR twice daily on a regularl basis....  We gave you the combination Tetanus vaccine called the TDAP today (it is good for 25yrs)...    Please call the [facility] travel clinic to inquire about any needed vaccines for your upcoming trip to [place]...  Let's get on track w/ our low carb, low fat, wt reducing diet 7 exercise program...    The goal is to lose 10-15 lbs...  Call for any problems... Let's plan a follow up visit in 6 months, sooner if needed.Marland KitchenMarland Kitchen

## 2011-01-21 NOTE — Progress Notes (Signed)
Subjective:    Patient ID: Amanda [name], female    DOB: 1956-05-23, 56 y.o.   MRN: [id number]  HPI  55 y/o WF here for a follow up visit... she has multiple medical problems as noted below... she was prev followed by DrWelty-Wolfe at [facility] for her Asthma, then got switched to [facility], but both of them have left... I tried to get her to see Dr. Tod Persia but pt has refused further referrals for her Asthma... the concern has been for the developement of fixed airway obstruction from her Asthma due to poor control and medication non-compliance in the past...   ~  June 22, 2009:  last seen 7/09 for med refills & refused referral to the [facility]... states that she's had a good year x for left side/ flank pain in 12/09 that turned out to be Shingles- Rx w/ Valtrex/ Pred & finally resolved... states she's been using her Advair500 twice daily + rescue inhaler several times per week... notes mild exac over the last 2 weeks- denies infection, etc... we discussed Rx w/ Medrol Dosepak & given samples of Symbicort/ Dulera to see if they work any better.  ~  January 12, 2010:  she requests that we start following her Chol & Thyroid since her LMD DrDoerr (Endocrine) from [facility] has left... pt was taking Simva40 & Armour Thyroid 60mg  but admits to intermittent dosing & hasn't taken in some time she says... similarly she is only using the Advair once daily & only uses the Nexium Prn for pain... she is not fasting today & we decided to refill meds as she requested, encourage regular dosing of all medications, and ROV later in the fasting state for blood work.  ~  July 16, 2010:  she notes some incr SOB, esp in the AM & w/ activities- better as the day goes on... she has tenderness in the ribs & chest wall w/ mult trigger points, fatigue, not resting well, wakes tired, etc> all raising the poss of Fibromyalgia... we decided to check her CXR (clear, NAD);   PFT (mild obstruction,  ?superimposed restriction?);  & fasting labs (OK x LDL131)...  she refuses Pneumovax & Flu vaccine... advised to take meds regularly, diet, exercise, get weight down...  ~  January 21, 2011:  35mo ROV & states she's been stable, wants refill prescriptions, will be going to [place] on vacation & TDAP given... When asked about her breathing she says "same old stuff" > she uses the Advair just once daily & incr to Bid "if I'm having a hard time" & uses the Ventolin rescue inhaler 0-3 times daily on ave (we again reviewed the need to use the Advair BID regularly as a controller med);  Similarly she takes the Simva40 irregularly but she estimates "most days" & reminded of low chol/ low fat diet needed;  Still on the Armor Thyroid 60mg /tab taking 2 tabs daily as directed by DrDoerr before she left her [facility] practice... She notes some reflux symptoms off & on and is reminded to take the PPI days & H2blocker Qhs... Finally she is asking for the Vit D 50K weekly because she doesn't like taking the OTC supplement daily...        Problem List:  ALLERGIC RHINITIS (ICD-477.9)  ++allergy testing by DrESL in the 1980's w/ reactions to grasses, ragweed, dust, molds, cats, dog... prev on shots...  ASTHMA (ICD-493.90) - on ADVAIR 500Bid, & VENTOLIN HFA as needed... she admits to using Advair  once dailly & is encouraged to take meds regularly as perscribed... ~  baseline CXR shows sl incr markings, NAD... ~  PFT's 5/05 showed FVC= 2.47 (76%), FEV1= 1.51 (56%), %1sec=61, mid-flows= 22%pred... ~  in 2006 she entered the EXTRA trial at [facility] which was a double blind trial testing XOLAIR... ~  last note from [facility] 6/07 DrCarraway w/ concern for her fixed airway obstruction... pt refused f/u in the Asthma Center... ~  f/u PFT 10/11 showed FVC= 2.52 (71%), FEV1= 1.69 (60%), %1sec=67, mid-flows= 38%pred. ~  CXR 10/11 showed clear lungs, NAD...  HYPERLIPIDEMIA (ICD-272.4) - prev followed by DrDoerr, Endocrinology in [place]... prev on  Zetia, now on SIMVASTATIN 40mg /d... pt doesn't know her numbers and we don't have records from DrD... ~  4/11: pt presents w/ request to start writing Simva Rx & monitor labs since DrDoerr has left [facility]... pt encouraged to take med regularly Qhs & follow low chol/ low fat diet... ~  FLP 10/11 on Simva40 showed TChol 198, TG 110, HDL 46, LDL 131  HYPOTHYROIDISM (ICD-244.9) - taking ARMOUR THYROID 60mg /d... she is followed by DrDoerr, Endocrine in [place] for thyroid and her hyperlipidemia... she was prev on Levothyroid and had requested change to Armour Thyroid last year because she believed the Levothy was making her hair fall out... ~  4/11:  pt reqested that we write her Rx for the Armour Thyroid 60mg  since DrDoerr has left [facility]... pt is uncouraged to take the med daily. ~  labs 10/11 showed TSH= 4.69  OVERWEIGHT (ICD-278.02) - we reviewed diet + exercise program required to lose weight... ~  weight 9/10 = 200#,  she is 5\' 5"  tall,  BMI= 34. ~  weight 4/11 = 192#,  this is as good as she's been in several yrs. 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 she continues to treat her reflux as needed w/ the Nexium and OTC Zantac even though we have felt this plays a signif roll in her Asthma... the consultants at [facility] agreed, but were no more effective at getting her to take her meds regularly... 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 ~  4/11:  meds re-written & encouraged to take Nexium 40mg /d- 30 min before the 1st meal, and Zantac 75- 1-2 at bedtime...  GYN = DrRomaine in the past... pt states that she stopped going because she couldn't help her w/ her hot flashes.  OSTEOARTHRITIS (ICD-715.90) - she had a left knee arthroscopy by DrPaul in 1999...  VITAMIN D DEFICIENCY (ICD-268.9) - pt takes Vit D  50,000 u weekly from DrDoerr... we don't have prev labs from her but pt indicates that the Vit D level was "really really low" so we will refill the Rx & f/u Vit D level later. ~  labs 10/11 showed Vit D level = 44... OK to switch to 1-2000u daily OTC supplement.  ANXIETY (ICD-300.00)  Hx of SHINGLES (ICD-053.9) - she presented w/ cryptic left flank pain 12/09 that turned out to be Shingles... treated w/ Valtrex, Pred, Vicodin. NOTE:  pt refuses Flu shots and Vaccinations...   Past Surgical History  Procedure Date  . Left knee arthroscopy 1999    by Dr. Renae Fickle  . Breast reductions 2000    in [place]  Outpatient Encounter Prescriptions as of 01/21/2011  Medication Sig Dispense Refill  . albuterol (VENTOLIN HFA) 108 (90 BASE) MCG/ACT inhaler Inhale 2 puffs into the lungs every 4 (four) hours as needed.        Marland Kitchen esomeprazole (NEXIUM) 40 MG capsule Take 40 mg by mouth daily before breakfast.        . Fluticasone-Salmeterol (ADVAIR DISKUS) 500-50 MCG/DOSE AEPB Inhale 1 puff into the lungs every 12 (twelve) hours.        . ranitidine (ZANTAC) 75 MG tablet Take 75 mg by mouth at bedtime as needed.        . simvastatin (ZOCOR) 40 MG tablet Take 40 mg by mouth at bedtime.        Marland Kitchen thyroid (ARMOUR) 60 MG tablet Take 60 mg by mouth 2 (two) times daily.        . Vitamin D, Ergocalciferol, (DRISDOL) 50000 UNITS CAPS Take 50,000 Units by mouth every 7 (seven) days.          No Known Allergies   Review of Systems       See HPI - all other systems neg except as noted...      The patient complains of dyspnea on exertion.  The patient denies anorexia, fever, weight loss, weight gain, vision loss, decreased hearing, hoarseness, chest pain, syncope, peripheral edema, prolonged cough, headaches, hemoptysis, abdominal pain, melena, hematochezia, severe indigestion/heartburn, hematuria, incontinence, muscle weakness, suspicious skin lesions, transient blindness, difficulty walking, depression, unusual  weight change, abnormal bleeding, enlarged lymph nodes, and angioedema.     Objective:   Physical Exam      WD, overweight, 55 y/o WF in NAD... GENERAL:  Alert & oriented; & cooperative... HEENT:  [place]/AT, EOM-wnl, PERRLA, Fundi-benign, EACs-clear, TMs-wnl, NOSE-clear, THROAT-clear & wnl. NECK:  Supple w/ full ROM; no JVD; normal carotid impulses w/o bruits; no thyromegaly or nodules palpated; no lymphadenopathy. CHEST:  Clear to P & A; decr BS bilat; without wheezes/ rales/ or rhonchi heard... HEART:  Regular Rhythm; without murmurs/ rubs/ or gallops detected... ABDOMEN:  Soft & nontender; normal bowel sounds; no organomegaly or masses palpated... EXT: without deformities, mild arthritic changes; no varicose veins/ venous insuffic/ or edema. NEURO:  CN's intact; no focal neuro deficits... DERM:  No lesions noted; no rash etc...   Assessment & Plan:   ASTHMA>  Again asked to use the Advair Bid regularly & gauge control by NOT having to use the rescue inhaler...  HYPERLIPID>  On Simva40 & asked to take it nightly to gauge it's effectiveness etc... Needs better diet & wt loss...  HYPOTHYROID>  On Armor Thyroid which she insists upon (prev from DrDoerr);  Continue same...  HH/ REFLUX>  Asked to be diligent w/ antireflux measures including the Nexium & Zantac as prescribed...  Vit D Deif>  She requests the 50K weekly Rx...  OVERWEIGHT>  We reviewed diet + exercise needed to lose wt.Marland KitchenMarland Kitchen

## 2011-02-10 ENCOUNTER — Encounter: Payer: Self-pay | Admitting: Pulmonary Disease

## 2011-02-18 ENCOUNTER — Encounter: Payer: Self-pay | Admitting: Pulmonary Disease

## 2011-02-22 NOTE — Assessment & Plan Note (Signed)
[facility]                             PULMONARY OFFICE NOTE   NAME:[name] [name] DOKE                 MRN:          [id number]  DATE:11/04/2006                            DOB:          1956-04-30    HISTORY OF PRESENT ILLNESS:  The patient is a 55 year old white female  patient of Dr. Jodelle Green who has a known history of asthma and presents  for an acute office visit complaining of a 4-week history of nasal  congestion, sore throat, productive cough, and hoarseness. The patient  has not been seen greater than 2-1/2 years and reports that she has been  doing well. She continues to follow up with Dr. Geraldo Docker at [facility] for her asthma and is currently maintained on Advair 500/50 twice  daily. The patient is followed with her hypothyroidism and  hyperlipidemia by Dr. Elnita Maxwell, her endocrinologist. The patient has been  using over-the-counter cold products without any relief.   PAST MEDICAL HISTORY:  Reviewed.   CURRENT MEDICATIONS:  Reviewed.   PHYSICAL EXAMINATION:  GENERAL:  The patient is a female in no acute  distress.  VITAL SIGNS:  She is afebrile with stable vital signs. O2 saturation  is  100% on room air  HEENT:  Nasal mucosa is slightly pale. Nontender sinuses. Posterior  pharynx is clear.  NECK:  Supple without adenopathy.  LUNGS:  Lung sounds are clear to auscultation bilaterally without any  wheezing or crackles.  CARDIAC:  Regular rate and rhythm.  ABDOMEN:  Soft and benign.  EXTREMITIES:  Warm without any calf tenderness, cyanosis, clubbing or  edema.   IMPRESSION/PLAN:  Acute upper respiratory infection. The patient is to  begin Omnicef x7 days. Mucinex DM twice a day. The patient may use  Tussionex 1 teaspoon every 12 hours as needed for cough. The patient is  to return here in 3 months with Dr. Kriste [name] or sooner if needed.      Rubye Oaks, NP  Electronically Signed      [name] [name]. Amanda Basque, MD  Electronically  Signed   TP/MedQ  DD: 11/04/2006  DT: 11/04/2006  Job #: [id number]

## 2011-07-30 ENCOUNTER — Ambulatory Visit (INDEPENDENT_AMBULATORY_CARE_PROVIDER_SITE_OTHER): Payer: 59 | Admitting: Pulmonary Disease

## 2011-07-30 ENCOUNTER — Encounter: Payer: Self-pay | Admitting: Pulmonary Disease

## 2011-07-30 DIAGNOSIS — E039 Hypothyroidism, unspecified: Secondary | ICD-10-CM

## 2011-07-30 DIAGNOSIS — J309 Allergic rhinitis, unspecified: Secondary | ICD-10-CM

## 2011-07-30 DIAGNOSIS — K449 Diaphragmatic hernia without obstruction or gangrene: Secondary | ICD-10-CM

## 2011-07-30 DIAGNOSIS — M1990 Unspecified osteoarthritis, unspecified site: Secondary | ICD-10-CM

## 2011-07-30 DIAGNOSIS — J45909 Unspecified asthma, uncomplicated: Secondary | ICD-10-CM

## 2011-07-30 DIAGNOSIS — E785 Hyperlipidemia, unspecified: Secondary | ICD-10-CM

## 2011-07-30 MED ORDER — THYROID 60 MG PO TABS: ORAL_TABLET | ORAL | Status: DC

## 2011-07-30 MED ORDER — FLUTICASONE-SALMETEROL 500-50 MCG/DOSE IN AEPB: 1.0000 | INHALATION_SPRAY | Freq: Two times a day (BID) | RESPIRATORY_TRACT | Status: DC

## 2011-07-30 MED ORDER — ALBUTEROL SULFATE HFA 108 (90 BASE) MCG/ACT IN AERS: 2.0000 | INHALATION_SPRAY | RESPIRATORY_TRACT | Status: DC | PRN

## 2011-07-30 MED ORDER — ESOMEPRAZOLE MAGNESIUM 40 MG PO CPDR: 40.0000 mg | DELAYED_RELEASE_CAPSULE | Freq: Every day | ORAL | Status: DC

## 2011-07-30 MED ORDER — SIMVASTATIN 40 MG PO TABS: 40.0000 mg | ORAL_TABLET | Freq: Every day | ORAL | Status: DC

## 2011-07-30 NOTE — Patient Instructions (Signed)
Today we updated your med list in EPIC...    We refilled your meds per request...  Please return to our lab one morning soon for your follow up fasting blood work...    Please call the PHONE TREE in a few days for your results...    Dial [id number] & when prompted enter your patient number followed by the # symbol...    Your patient number is:  [id number]#  Call for any questions...  Let's plan a follow up visit in about 6 months or so.Marland KitchenMarland Kitchen

## 2011-07-31 ENCOUNTER — Telehealth: Payer: Self-pay | Admitting: Pulmonary Disease

## 2011-07-31 NOTE — Telephone Encounter (Signed)
Nexium requires PA. Called [employer] at [phone]. Member ID # [id number]. Request for coverage is in clinical review and they will fax the approval/denial within 24 - 48 hours. Will forward this to [name] so she is aware to watch for that fax.

## 2011-08-01 MED ORDER — PANTOPRAZOLE SODIUM 40 MG PO TBEC: 40.0000 mg | DELAYED_RELEASE_TABLET | Freq: Every day | ORAL | Status: DC

## 2011-08-01 NOTE — Telephone Encounter (Signed)
Nexium has been DENIED. After speaking with a different representative today at [employer], I was able to get a list of covered alternatives. They are:  Omeprazole and Pantoprazole. Pt states she has tried Omeprazole in the past and did not do well with it. She is willing to try the Pantoprazole to see if it works for her. She will callback in a few weeks to let us know how this is working for her and at that time we can send additional refills or call the insurance back for PA on Nexium.

## 2011-08-07 ENCOUNTER — Other Ambulatory Visit (INDEPENDENT_AMBULATORY_CARE_PROVIDER_SITE_OTHER): Payer: 59

## 2011-08-07 DIAGNOSIS — E039 Hypothyroidism, unspecified: Secondary | ICD-10-CM

## 2011-08-07 DIAGNOSIS — K449 Diaphragmatic hernia without obstruction or gangrene: Secondary | ICD-10-CM

## 2011-08-07 DIAGNOSIS — E785 Hyperlipidemia, unspecified: Secondary | ICD-10-CM

## 2011-08-07 LAB — BASIC METABOLIC PANEL
BUN: 11 mg/dL (ref 6–23)
CO2: 27 mEq/L (ref 19–32)
Calcium: 8.8 mg/dL (ref 8.4–10.5)
Chloride: 106 mEq/L (ref 96–112)
Creatinine, Ser: 0.8 mg/dL (ref 0.4–1.2)
GFR: 80.39 mL/min (ref >60.00)
Glucose, Bld: 109 mg/dL — ABNORMAL HIGH (ref 70–99)
Potassium: 4 mEq/L (ref 3.5–5.1)
Sodium: 141 mEq/L (ref 135–145)

## 2011-08-07 LAB — CBC WITH DIFFERENTIAL/PLATELET
Basophils Absolute: 0.1 10*3/uL (ref 0.0–0.1)
Basophils Relative: 0.7 % (ref 0.0–3.0)
Eosinophils Absolute: 0.6 10*3/uL (ref 0.0–0.7)
Eosinophils Relative: 7.4 % — ABNORMAL HIGH (ref 0.0–5.0)
HCT: 43.9 % (ref 36.0–46.0)
Hemoglobin: 14.9 g/dL (ref 12.0–15.0)
Lymphocytes Relative: 27.9 % (ref 12.0–46.0)
Lymphs Abs: 2.2 10*3/uL (ref 0.7–4.0)
MCHC: 34 g/dL (ref 30.0–36.0)
MCV: 92.5 fl (ref 78.0–100.0)
Monocytes Absolute: 0.5 10*3/uL (ref 0.1–1.0)
Monocytes Relative: 6.3 % (ref 3.0–12.0)
Neutro Abs: 4.5 10*3/uL (ref 1.4–7.7)
Neutrophils Relative %: 57.7 % (ref 43.0–77.0)
Platelets: 278 10*3/uL (ref 150.0–400.0)
RBC: 4.75 Mil/uL (ref 3.87–5.11)
RDW: 13.5 % (ref 11.5–14.6)
WBC: 7.7 10*3/uL (ref 4.5–10.5)

## 2011-08-07 LAB — HEPATIC FUNCTION PANEL
ALT: 37 U/L — ABNORMAL HIGH (ref 0–35)
AST: 25 U/L (ref 0–37)
Albumin: 4.2 g/dL (ref 3.5–5.2)
Alkaline Phosphatase: 91 U/L (ref 39–117)
Bilirubin, Direct: 0 mg/dL (ref 0.0–0.3)
Total Bilirubin: 0.4 mg/dL (ref 0.3–1.2)
Total Protein: 6.9 g/dL (ref 6.0–8.3)

## 2011-08-07 LAB — LIPID PANEL
Cholesterol: 172 mg/dL (ref 0–200)
HDL: 50.9 mg/dL (ref >39.00)
LDL Cholesterol: 105 mg/dL — ABNORMAL HIGH (ref 0–99)
Total CHOL/HDL Ratio: 3
Triglycerides: 82 mg/dL (ref 0.0–149.0)
VLDL: 16.4 mg/dL (ref 0.0–40.0)

## 2011-08-07 LAB — TSH: TSH: 2.88 u[IU]/mL (ref 0.35–5.50)

## 2011-08-11 ENCOUNTER — Encounter: Payer: Self-pay | Admitting: Pulmonary Disease

## 2011-08-11 NOTE — Progress Notes (Signed)
Subjective:    Patient ID: Karl Pock, female    DOB: [birth year], 55 y.o.   MRN: [id number]  HPI  55 y/o WF here for a follow up visit... she has multiple medical problems as noted below... she was prev followed by DrWelty-Wolfe at [facility] for her Asthma, then switched to [facility], but both of them have left... I tried to get her to see Dr. Tod Persia but pt has refused further referrals for her Asthma... the concern has been for the developement of fixed airway obstruction from her Asthma due to poor control and medication non-compliance in the past...   ~  June 22, 2009:  last seen 7/09 for med refills & refused referral to the [facility]... states that she's had a good year x for left side/ flank pain in 12/09 that turned out to be Shingles- Rx w/ Valtrex/ Pred & finally resolved... states she's been using her Advair500 twice daily + rescue inhaler several times per week... notes mild exac over the last 2 weeks- denies infection, etc... we discussed Rx w/ Medrol Dosepak & given samples of Symbicort/ Dulera to see if they work any better.  ~  January 12, 2010:  she requests that we start following her Chol & Thyroid since her LMD DrDoerr (Endocrine) from [place] has left... pt was taking Simva40 & Armour Thyroid 60mg  but admits to intermittent dosing & hasn't taken in some time she says... similarly she is only using the Advair once daily & only uses the Nexium Prn for pain... she is not fasting today & we decided to refill meds as she requested, encourage regular dosing of all medications, and ROV later in the fasting state for blood work.  ~  July 16, 2010:  she notes some incr SOB, esp in the AM & w/ activities- better as the day goes on... she has tenderness in the ribs & chest wall w/ mult trigger points, fatigue, not resting well, wakes tired, etc> all raising the poss of Fibromyalgia... we decided to check her CXR (clear, NAD);  PFT (mild obstruction, ?superimposed  restriction?);  & fasting labs (OK x LDL131)...  she refuses Pneumovax & Flu vaccine... advised to take meds regularly, diet, exercise, get weight down...  ~  January 21, 2011:  13mo ROV & states she's been stable, wants refill prescriptions, will be going to [place] on vacation & TDAP given... When asked about her breathing she says "same old stuff" > she uses the Advair just once daily & incr to Bid "if I'm having a hard time" & uses the Ventolin rescue inhaler 0-3 times daily on ave (we again reviewed the need to use the Advair BID regularly as a controller med);  Similarly she takes the Simva40 irregularly but she estimates "most days" & reminded of low chol/ low fat diet needed;  Still on the Armor Thyroid 60mg /tab taking 2 tabs daily as directed by DrDoerr before she left her [facility] practice... She notes some reflux symptoms off & on and is reminded to take the PPI days & H2blocker Qhs... Finally she is asking for the Vit D 50K weekly because she doesn't like taking the OTC supplement daily...  ~  July 30, 2011:  13mo ROV & she reports reasonably stable, no major exac, taking meds as before, & asking for a cough syrup for prn use... As noted last OV- we placed her back on the VitD 50K weekly per her request since she didn't like taking the  OTC supplements daily, but she tells me that stopped the 50K pills as well (just never renewed it)... She has a new kitten at home too, but states that so far her breathing is good "no problem"... She will ret for FASTING blood work==> all looks good (see below).        Problem List:  ALLERGIC RHINITIS (ICD-477.9)  ++allergy testing by DrESL in the 1980's w/ reactions to grasses, ragweed, dust, molds, cats, dog... prev on shots... ~  10/12:  She reports a new kitten at home, but denies allergy or asthma exac so far...  ASTHMA (ICD-493.90) - on ADVAIR 500Bid, & VENTOLIN HFA as needed... she admits to using Advair once dailly & is encouraged to take meds regularly as  perscribed... ~  baseline CXR shows sl incr markings, NAD... ~  PFT's 5/05 showed FVC= 2.47 (76%), FEV1= 1.51 (56%), %1sec=61, mid-flows= 22%pred... ~  in 2006 she entered the EXTRA trial at [facility] which was a double blind trial testing XOLAIR... ~  last note from [facility] 6/07 DrCarraway w/ concern for her fixed airway obstruction... pt refused f/u in the [facility]... ~  f/u PFT 10/11 showed FVC= 2.52 (71%), FEV1= 1.69 (60%), %1sec=67, mid-flows= 38%pred. ~  CXR 10/11 showed clear lungs, NAD...  HYPERLIPIDEMIA (ICD-272.4) - prev followed by DrDoerr, Endocrinology [place]... prev on Zetia, now on SIMVASTATIN 40mg /d... ~  4/11: pt presents w/ request to start writing Simva Rx & monitor labs since DrDoerr has left [facility]... encouraged to take med regularly & follow low chol/ low fat diet... ~  FLP 10/11 on Simva40 showed TChol 198, TG 110, HDL 46, LDL 131 ~  FLP 10/12 on Simva40 showed TChol 172, TG 82, HDL 51, LDL 105... Improved, reminded of diet & compliance.  HYPOTHYROIDISM (ICD-244.9) - taking ARMOUR THYROID 60mg - 2tabs daily... she was followed by DrDoerr, Endocrine in [place] for thyroid and her hyperlipidemia... she was prev on Levothyroid and had requested change to Armour Thyroid because she believed the Levothy was making her hair fall out... ~  4/11:  pt reqested that we write her Rx for the Armour Thyroid 60mg  since DrDoerr has left [facility]... pt is uncouraged to take the med daily. ~  labs 10/11 showed TSH= 4.69 ~  Labs 10/12 on ArmourThyroid60-2/d showed TSH= 2.88... rec to continue same.  OVERWEIGHT (ICD-278.02) - we reviewed diet + exercise program required to lose weight... ~  weight 9/10 = 200#,  she is 5\' 5"  tall,  BMI= 34. ~  weight 4/11 = 192#,  this is as good as she's been in several yrs. 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 she continues to treat her reflux as needed w/ the Nexium and OTC Zantac even though we have felt this plays a signif roll in her Asthma... the consultants at [facility] agreed, but were no more effective at getting her to take her meds regularly... 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 ~  meds re-written & encouraged to take Nexium 40mg /d- 30 min before the 1st meal, and Zantac 75- 1-2 at bedtime...  GYN = DrRomaine in the past... pt states that she stopped going because she couldn't help her w/ her hot flashes.  OSTEOARTHRITIS (ICD-715.90) - she  had a left knee arthroscopy by DrPaul in 1999...  VITAMIN D DEFICIENCY (ICD-268.9) - pt was on Vit D 50,000 u weekly from DrDoerr... we don't have prev labs from her but pt indicates that the Vit D level was "really really low" so we will refill the Rx & f/u Vit D level later. ~  labs 10/11 showed Vit D level = 44... OK to switch to 1-2000u daily OTC supplement. ~  4/12:  Pt requested to go back on Vit D 50000u weekly... ~  10/12:  Pt indicated that she stopped filling the Rx for Vit D 50K weekly, ?why?  ANXIETY (ICD-300.00)  Hx of SHINGLES (ICD-053.9) - she presented w/ cryptic left flank pain 12/09 that turned out to be Shingles... treated w/ Valtrex, Pred, Vicodin. NOTE:  she refuses Pneumovax & Flu vaccine... She received TDAP 2012 prior to a trip to [place].   Past Surgical History  Procedure Date  . Left knee arthroscopy 1999    by Dr. Renae Fickle  . Breast reductions 2000    in [place]    Outpatient Encounter Prescriptions as of 07/30/2011  Medication Sig Dispense Refill  . albuterol (VENTOLIN HFA) 108 (90 BASE) MCG/ACT inhaler Inhale 2 puffs into the lungs every 4 (four) hours as needed.  1 Inhaler  11  . Fluticasone-Salmeterol (ADVAIR DISKUS) 500-50 MCG/DOSE AEPB Inhale 1 puff into the lungs every 12 (twelve) hours.  60 each  11  . ranitidine (ZANTAC) 75 MG tablet  Take 75 mg by mouth at bedtime as needed.        . simvastatin (ZOCOR) 40 MG tablet Take 1 tablet (40 mg total) by mouth at bedtime.  30 tablet  11  . thyroid (ARMOUR) 60 MG tablet Take 2 tablets by mouth once daily  60 tablet  11  .  esomeprazole (NEXIUM) 40 MG capsule Take 1 capsule (40 mg total) by mouth daily before breakfast.  30 capsule  11  .  Vitamin D, Ergocalciferol, (DRISDOL) 50000 UNITS CAPS Take 50,000 Units by mouth every 7 (seven) days.    ==> asked to restart this med      No Known Allergies   Current Medications, Allergies, Past Medical History, Past Surgical History, Family History, and Social History were reviewed in [employer] record.    Review of Systems       See HPI - all other systems neg except as noted...      The patient complains of dyspnea on exertion.  The patient denies anorexia, fever, weight loss, weight gain, vision loss, decreased hearing, hoarseness, chest pain, syncope, peripheral edema, prolonged cough, headaches, hemoptysis, abdominal pain, melena, hematochezia, severe indigestion/heartburn, hematuria, incontinence, muscle weakness, suspicious skin lesions, transient blindness, difficulty walking, depression, unusual weight change, abnormal bleeding, enlarged lymph nodes, and angioedema.     Objective:   Physical Exam      WD, overweight, 55 y/o WF in NAD... GENERAL:  Alert & oriented; & cooperative... HEENT:  [place]/AT, EOM-wnl, PERRLA, Fundi-benign, EACs-clear, TMs-wnl, NOSE-clear, THROAT-clear & wnl. NECK:  Supple w/ full ROM; no JVD; normal carotid impulses w/o bruits; no thyromegaly or nodules palpated; no lymphadenopathy. CHEST:  Clear to P & A; decr BS bilat; without wheezes/ rales/ or rhonchi heard... HEART:  Regular Rhythm; without murmurs/ rubs/ or gallops detected... ABDOMEN:  Soft & nontender; normal bowel sounds; no organomegaly or masses palpated... EXT: without deformities, mild arthritic changes; no varicose  veins/ venous insuffic/ or edema. NEURO:  CN's intact; no focal  neuro deficits... DERM:  No lesions noted; no rash etc...   Assessment & Plan:   ASTHMA>  Again asked to use the Advair Bid regularly & gauge control by NOT having to use the rescue inhaler...  HYPERLIPID>  On Simva40 & asked to take it nightly to gauge it's effectiveness; FLP improved w/ more regular dosing; Needs better diet & wt loss...  HYPOTHYROID>  On Armor Thyroid which she insists upon (prev from DrDoerr); TSH looks good; Continue same...  HH/ REFLUX>  Asked to be diligent w/ antireflux measures including the Nexium & Zantac as prescribed...  Vit D Deif>  Asked to stay on the 50K weekly Rx...  OVERWEIGHT>  We reviewed diet + exercise needed to lose wt...  Other medical issues as noted.Marland KitchenMarland Kitchen

## 2011-08-30 ENCOUNTER — Telehealth: Payer: Self-pay | Admitting: Pulmonary Disease

## 2011-08-30 MED ORDER — AZITHROMYCIN 250 MG PO TABS: ORAL_TABLET | ORAL | Status: AC

## 2011-08-30 MED ORDER — PROMETHAZINE-CODEINE 6.25-10 MG/5ML PO SYRP: 5.0000 mL | ORAL_SOLUTION | Freq: Four times a day (QID) | ORAL | Status: AC | PRN

## 2011-08-30 NOTE — Telephone Encounter (Signed)
rx sent. Pt aware.Jennifer Castillo, CMA  

## 2011-08-30 NOTE — Telephone Encounter (Signed)
Dr. Kriste Basque patient--Spoke with the pt and she is c/o sore throat, dry cough, fever, chest congestion since Wed night. She is requesting and rx for cough syrup. She states she has tried Delsym OTC without relief. Please advise. Carron Curie, CMA No Known Allergies

## 2011-08-30 NOTE — Telephone Encounter (Signed)
Per CY---ok for zpak  #1  Take as directed with no refills, promethazine codeine  1 tsp every 6 hours prn cough with no refills.  thanks

## 2011-10-10 ENCOUNTER — Other Ambulatory Visit: Payer: Self-pay | Admitting: *Deleted

## 2011-10-10 MED ORDER — PANTOPRAZOLE SODIUM 40 MG PO TBEC: 40.0000 mg | DELAYED_RELEASE_TABLET | Freq: Every day | ORAL | Status: DC

## 2012-01-07 ENCOUNTER — Ambulatory Visit (INDEPENDENT_AMBULATORY_CARE_PROVIDER_SITE_OTHER): Payer: 59 | Admitting: Pulmonary Disease

## 2012-01-07 ENCOUNTER — Encounter: Payer: Self-pay | Admitting: Pulmonary Disease

## 2012-01-07 VITALS — BP 126/84 | HR 69 | Temp 97.9°F | Ht 65.0 in | Wt 198.8 lb

## 2012-01-07 DIAGNOSIS — M1990 Unspecified osteoarthritis, unspecified site: Secondary | ICD-10-CM

## 2012-01-07 DIAGNOSIS — K449 Diaphragmatic hernia without obstruction or gangrene: Secondary | ICD-10-CM

## 2012-01-07 DIAGNOSIS — F411 Generalized anxiety disorder: Secondary | ICD-10-CM

## 2012-01-07 DIAGNOSIS — E039 Hypothyroidism, unspecified: Secondary | ICD-10-CM

## 2012-01-07 DIAGNOSIS — E663 Overweight: Secondary | ICD-10-CM

## 2012-01-07 DIAGNOSIS — E785 Hyperlipidemia, unspecified: Secondary | ICD-10-CM

## 2012-01-07 DIAGNOSIS — J45909 Unspecified asthma, uncomplicated: Secondary | ICD-10-CM

## 2012-01-07 DIAGNOSIS — J309 Allergic rhinitis, unspecified: Secondary | ICD-10-CM

## 2012-01-07 MED ORDER — THYROID 60 MG PO TABS: ORAL_TABLET | ORAL | Status: DC

## 2012-01-07 MED ORDER — SIMVASTATIN 40 MG PO TABS: 40.0000 mg | ORAL_TABLET | Freq: Every day | ORAL | Status: DC

## 2012-01-07 MED ORDER — ALBUTEROL SULFATE HFA 108 (90 BASE) MCG/ACT IN AERS: 2.0000 | INHALATION_SPRAY | RESPIRATORY_TRACT | Status: DC | PRN

## 2012-01-07 MED ORDER — FLUTICASONE-SALMETEROL 500-50 MCG/DOSE IN AEPB: 1.0000 | INHALATION_SPRAY | Freq: Two times a day (BID) | RESPIRATORY_TRACT | Status: DC

## 2012-01-07 MED ORDER — RANITIDINE HCL 75 MG PO TABS: 75.0000 mg | ORAL_TABLET | Freq: Every evening | ORAL | Status: DC | PRN

## 2012-01-07 MED ORDER — PANTOPRAZOLE SODIUM 40 MG PO TBEC: 40.0000 mg | DELAYED_RELEASE_TABLET | Freq: Every day | ORAL | Status: DC

## 2012-01-07 NOTE — Patient Instructions (Signed)
Today we updated your med list in our EPIC system...    Continue your current medications the same...    We refilled your meds per request...  At your convenience> remember to set up a routine GYN appt for PAP smear etc...    You may get your Mammogram & Bone Density test at the Imaging center or some gyn offices...  Call for any problems...  Let's plan a recheck in 6 months.Marland KitchenMarland Kitchen

## 2012-01-07 NOTE — Progress Notes (Addendum)
Subjective:    Patient ID: Amanda [name], female    DOB: [birth year], 56 y.o.   MRN: [id number]  HPI 56 y/o WF here for a follow up visit... she has multiple medical problems as noted below... she was prev followed by DrWelty-Wolfe at [facility] for her Asthma, then switched to [facility], but both of them have left... I tried to get her to see Dr. Tod Persia but pt has refused further referrals for her Asthma... the concern has been for the developement of fixed airway obstruction from her Asthma due to poor control and medication non-compliance in the past...   ~  June 22, 2009:  last seen 7/09 for med refills & refused referral to the [facility]... states that she's had a good year x for left side/ flank pain in 12/09 that turned out to be Shingles- Rx w/ Valtrex/ Pred & finally resolved... states she's been using her Advair500 twice daily + rescue inhaler several times per week... notes mild exac over the last 2 weeks- denies infection, etc... we discussed Rx w/ Medrol Dosepak & given samples of Symbicort/ Dulera to see if they work any better.  ~  January 12, 2010:  she requests that we start following her Chol & Thyroid since her LMD DrDoerr (Endocrine) from [place] has left... pt was taking Simva40 & Armour Thyroid 60mg  but admits to intermittent dosing & hasn't taken in some time she says... similarly she is only using the Advair once daily & only uses the Nexium Prn for pain... she is not fasting today & we decided to refill meds as she requested, encourage regular dosing of all medications, and ROV later in the fasting state for blood work.  ~  July 16, 2010:  she notes some incr SOB, esp in the AM & w/ activities- better as the day goes on... she has tenderness in the ribs & chest wall w/ mult trigger points, fatigue, not resting well, wakes tired, etc> all raising the poss of Fibromyalgia... we decided to check her CXR (clear, NAD);  PFT (mild obstruction, ?superimposed  restriction?);  & fasting labs (OK x LDL131)...  she refuses Pneumovax & Flu vaccine... advised to take meds regularly, diet, exercise, get weight down...  ~  January 21, 2011:  54mo ROV & states she's been stable, wants refill prescriptions, will be going to [place] on vacation & TDAP given... When asked about her breathing she says "same old stuff" > she uses the Advair just once daily & incr to Bid "if I'm having a hard time" & uses the Ventolin rescue inhaler 0-3 times daily on ave (we again reviewed the need to use the Advair BID regularly as a controller med);  Similarly she takes the Simva40 irregularly but she estimates "most days" & reminded of low chol/ low fat diet needed;  Still on the Armor Thyroid 60mg /tab taking 2 tabs daily as directed by DrDoerr before she left her [facility] practice... She notes some reflux symptoms off & on and is reminded to take the PPI days & H2blocker Qhs... Finally she is asking for the Vit D 50K weekly because she doesn't like taking the OTC supplement daily...  ~  July 30, 2011:  54mo ROV & she reports reasonably stable, no major exac, taking meds as before, & asking for a cough syrup for prn use... As noted last OV- we placed her back on the VitD 50K weekly per her request since she didn't like taking the OTC  supplements daily, but she tells me that stopped the 50K pills as well (just never renewed it)... She has a new kitten at home too, but states that so far her breathing is good "no problem"... She will ret for FASTING blood work==> all looks good (see below).  ~  January 07, 2012:  45mo ROV & Amanda [name] is stable, just getting over a cold she says but breathing is good, at baseline;  We reviewed her prob list, meds, xrays & labs> see below>>         Problem List:  ALLERGIC RHINITIS (ICD-477.9)  ++allergy testing by DrESL in the 1980's w/ reactions to grasses, ragweed, dust, molds, cats, dog... prev on shots... ~  10/12:  She reports a new kitten at home, but denies allergy  or asthma exac so far...  ASTHMA (ICD-493.90) - on ADVAIR 500Bid, & VENTOLIN HFA as needed... she admits to using Advair once dailly & is encouraged to take meds regularly as perscribed... ~  baseline CXR shows sl incr markings, NAD... ~  PFT's 5/05 showed FVC= 2.47 (76%), FEV1= 1.51 (56%), %1sec=61, mid-flows= 22%pred... ~  in 2006 she entered the EXTRA trial at [facility] which was a double blind trial testing XOLAIR... ~  last note from [facility] 6/07 DrCarraway w/ concern for her fixed airway obstruction... pt refused f/u in the [facility]... ~  f/u PFT 10/11 showed FVC= 2.52 (71%), FEV1= 1.69 (60%), %1sec=67, mid-flows= 38%pred. ~  CXR 10/11 showed clear lungs, NAD...  HYPERLIPIDEMIA (ICD-272.4) - prev followed by DrDoerr, [place]ocrinology [place]... prev on Zetia, now on SIMVASTATIN 40mg /d... ~  4/11: pt presents w/ request to start writing Simva Rx & monitor labs since DrDoerr has left [facility]... encouraged to take med regularly & follow low chol/ low fat diet... ~  FLP 10/11 on Simva40 showed TChol 198, TG 110, HDL 46, LDL 131 ~  FLP 10/12 on Simva40 showed TChol 172, TG 82, HDL 51, LDL 105... Improved, reminded of diet & compliance.  HYPOTHYROIDISM (ICD-244.9) - taking ARMOUR THYROID 60mg - 2tabs daily... she was followed by DrDoerr, Endocrine in [place] for thyroid and her hyperlipidemia... she was prev on Levothyroid and had requested change to Armour Thyroid because she believed the Levothy was making her hair fall out... ~  4/11:  pt reqested that we write her Rx for the Armour Thyroid 60mg  since DrDoerr has left [facility]... pt is uncouraged to take the med daily. ~  labs 10/11 showed TSH= 4.69 ~  Labs 10/12 on ArmourThyroid60-2/d showed TSH= 2.88... rec to continue same.  OVERWEIGHT (ICD-278.02) - we reviewed diet + exercise program required to lose weight... ~  weight 9/10 = 200#,  she is 5\' 5"  tall,  BMI= 34. ~  weight 4/11 = 192#,  this is as good as she's been in several yrs. 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 she continues to treat her reflux as needed w/ the Nexium and OTC Zantac even though we have felt this plays a signif roll in her Asthma... the consultants at [facility] agreed, but were no more effective at getting her to take her meds regularly... 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 ~  meds re-written &  encouraged to take PROTONIX 40mg /d- 30 min before the 1st meal, and Zantac 75- 1-2 at bedtime...  GYN = DrRomaine in the past... pt states that she stopped going because she couldn't help her w/ her hot flashes.  OSTEOARTHRITIS (ICD-715.90) - she had a left knee arthroscopy by DrPaul in 1999...  VITAMIN D DEFICIENCY (ICD-268.9) - pt was on Vit D 50,000 u weekly from DrDoerr... we don't have prev labs from her but pt indicates that the Vit D level was "really really low" so we will refill the Rx & f/u Vit D level later. ~  labs 10/11 showed Vit D level = 44... OK to switch to 1-2000u daily OTC supplement. ~  4/12:  Pt requested to go back on Vit D 50000u weekly... ~  10/12:  Pt indicated that she stopped filling the Rx for Vit D 50K weekly, ?why?  ANXIETY (ICD-300.00)  Hx of SHINGLES (ICD-053.9) - she presented w/ cryptic left flank pain 12/09 that turned out to be Shingles... treated w/ Valtrex, Pred, Vicodin. NOTE:  she refuses Pneumovax & Flu vaccine... She received TDAP 2012 prior to a trip to [place].   Past Surgical History  Procedure Date  . Left knee arthroscopy 1999    by Dr. Renae Fickle  . Breast reductions 2000    in [place]    Outpatient Encounter Prescriptions as of 01/07/2012  Medication Sig Dispense Refill  . albuterol (VENTOLIN HFA) 108 (90 BASE) MCG/ACT inhaler Inhale 2 puffs into the lungs every 4 (four)  hours as needed.  1 Inhaler  11  . Fluticasone-Salmeterol (ADVAIR DISKUS) 500-50 MCG/DOSE AEPB Inhale 1 puff into the lungs every 12 (twelve) hours.  60 each  11  . pantoprazole (PROTONIX) 40 MG tablet Take 1 tablet (40 mg total) by mouth daily.  30 tablet  6  . ranitidine (ZANTAC) 75 MG tablet Take 75 mg by mouth at bedtime as needed.        . simvastatin (ZOCOR) 40 MG tablet Take 1 tablet (40 mg total) by mouth at bedtime.  30 tablet  11  . thyroid (ARMOUR) 60 MG tablet Take 2 tablets by mouth once daily  60 tablet  11    Allergies  Allergen Reactions  . Azithromycin     zpak does not work for the pt    Current Medications, Allergies, Past Medical History, Past Surgical History, Family History, and Social History were reviewed in [employer] record.    Review of Systems       See HPI - all other systems neg except as noted...      The patient complains of dyspnea on exertion.  The patient denies anorexia, fever, weight loss, weight gain, vision loss, decreased hearing, hoarseness, chest pain, syncope, peripheral edema, prolonged cough, headaches, hemoptysis, abdominal pain, melena, hematochezia, severe indigestion/heartburn, hematuria, incontinence, muscle weakness, suspicious skin lesions, transient blindness, difficulty walking, depression, unusual weight change, abnormal bleeding, enlarged lymph nodes, and angioedema.     Objective:   Physical Exam      WD, overweight, 57 y/o WF in NAD... GENERAL:  Alert & oriented; & cooperative... HEENT:  [place]/AT, EOM-wnl, PERRLA, Fundi-benign, EACs-clear, TMs-wnl, NOSE-clear, THROAT-clear & wnl. NECK:  Supple w/ full ROM; no JVD; normal carotid impulses w/o bruits; no thyromegaly or nodules palpated; no lymphadenopathy. CHEST:  Clear to P & A; decr BS bilat; without wheezes/ rales/ or rhonchi heard... HEART:  Regular Rhythm; without murmurs/ rubs/ or gallops detected... ABDOMEN:  Soft & nontender; normal  bowel sounds; no  organomegaly or masses palpated... EXT: without deformities, mild arthritic changes; no varicose veins/ venous insuffic/ or edema. NEURO:  CN's intact; no focal neuro deficits... DERM:  No lesions noted; no rash etc...  RADIOLOGY DATA:  Reviewed in the EPIC EMR & discussed w/ the patient...  LABORATORY DATA:  Reviewed in the EPIC EMR & discussed w/ the patient...   Assessment & Plan:   ASTHMA>  Again asked to use the Advair Bid regularly & gauge control by NOT having to use the rescue inhaler...  HYPERLIPID>  On Simva40 & asked to take it nightly to gauge it's effectiveness; FLP improved w/ more regular dosing; Needs better diet & wt loss...  HYPOTHYROID>  On Armor Thyroid which she insists upon (prev from DrDoerr); TSH looks good; Continue same...  HH/ REFLUX>  Asked to be diligent w/ antireflux measures including the Protonix & Zantac as prescribed...  Vit D Deif>  Asked to stay on the 50K weekly Rx...  OVERWEIGHT>  We reviewed diet + exercise needed to lose wt...  Other medical issues as noted...  Patient's Medications  New Prescriptions   No medications on file  Previous Medications   No medications on file  Modified Medications   Modified Medication Previous Medication   ALBUTEROL (VENTOLIN HFA) 108 (90 BASE) MCG/ACT INHALER albuterol (VENTOLIN HFA) 108 (90 BASE) MCG/ACT inhaler      Inhale 2 puffs into the lungs every 4 (four) hours as needed.    Inhale 2 puffs into the lungs every 4 (four) hours as needed.   FLUTICASONE-SALMETEROL (ADVAIR DISKUS) 500-50 MCG/DOSE AEPB Fluticasone-Salmeterol (ADVAIR DISKUS) 500-50 MCG/DOSE AEPB      Inhale 1 puff into the lungs every 12 (twelve) hours.    Inhale 1 puff into the lungs every 12 (twelve) hours.   PANTOPRAZOLE (PROTONIX) 40 MG TABLET pantoprazole (PROTONIX) 40 MG tablet      Take 1 tablet (40 mg total) by mouth daily.    Take 1 tablet (40 mg total) by mouth daily.   RANITIDINE (ZANTAC) 75 MG TABLET ranitidine (ZANTAC) 75 MG  tablet      Take 1 tablet (75 mg total) by mouth at bedtime as needed.    Take 75 mg by mouth at bedtime as needed.     SIMVASTATIN (ZOCOR) 40 MG TABLET simvastatin (ZOCOR) 40 MG tablet      Take 1 tablet (40 mg total) by mouth at bedtime.    Take 1 tablet (40 mg total) by mouth at bedtime.   THYROID (ARMOUR) 60 MG TABLET thyroid (ARMOUR) 60 MG tablet      Take 2 tablets by mouth once daily    Take 2 tablets by mouth once daily  Discontinued Medications   No medications on file

## 2012-07-08 ENCOUNTER — Encounter: Payer: Self-pay | Admitting: *Deleted

## 2012-07-09 ENCOUNTER — Ambulatory Visit (INDEPENDENT_AMBULATORY_CARE_PROVIDER_SITE_OTHER): Payer: BC Managed Care – PPO | Admitting: Pulmonary Disease

## 2012-07-09 ENCOUNTER — Other Ambulatory Visit (INDEPENDENT_AMBULATORY_CARE_PROVIDER_SITE_OTHER): Payer: BC Managed Care – PPO

## 2012-07-09 ENCOUNTER — Encounter: Payer: Self-pay | Admitting: Pulmonary Disease

## 2012-07-09 VITALS — BP 142/96 | HR 74 | Temp 97.6°F | Ht 65.0 in | Wt 194.4 lb

## 2012-07-09 DIAGNOSIS — J45909 Unspecified asthma, uncomplicated: Secondary | ICD-10-CM

## 2012-07-09 DIAGNOSIS — E559 Vitamin D deficiency, unspecified: Secondary | ICD-10-CM

## 2012-07-09 DIAGNOSIS — K449 Diaphragmatic hernia without obstruction or gangrene: Secondary | ICD-10-CM

## 2012-07-09 DIAGNOSIS — E785 Hyperlipidemia, unspecified: Secondary | ICD-10-CM

## 2012-07-09 DIAGNOSIS — F411 Generalized anxiety disorder: Secondary | ICD-10-CM

## 2012-07-09 DIAGNOSIS — M1990 Unspecified osteoarthritis, unspecified site: Secondary | ICD-10-CM

## 2012-07-09 DIAGNOSIS — E039 Hypothyroidism, unspecified: Secondary | ICD-10-CM

## 2012-07-09 DIAGNOSIS — E663 Overweight: Secondary | ICD-10-CM

## 2012-07-09 LAB — HEPATIC FUNCTION PANEL
ALT: 36 U/L — ABNORMAL HIGH (ref 0–35)
AST: 28 U/L (ref 0–37)
Albumin: 4.1 g/dL (ref 3.5–5.2)
Alkaline Phosphatase: 92 U/L (ref 39–117)
Bilirubin, Direct: 0.1 mg/dL (ref 0.0–0.3)
Total Bilirubin: 0.7 mg/dL (ref 0.3–1.2)
Total Protein: 6.9 g/dL (ref 6.0–8.3)

## 2012-07-09 LAB — LIPID PANEL
Cholesterol: 229 mg/dL — ABNORMAL HIGH (ref 0–200)
HDL: 47.7 mg/dL (ref >39.00)
Total CHOL/HDL Ratio: 5
Triglycerides: 60 mg/dL (ref 0.0–149.0)
VLDL: 12 mg/dL (ref 0.0–40.0)

## 2012-07-09 LAB — CBC WITH DIFFERENTIAL/PLATELET
Basophils Absolute: 0.1 10*3/uL (ref 0.0–0.1)
Basophils Relative: 0.9 % (ref 0.0–3.0)
Eosinophils Absolute: 0.5 10*3/uL (ref 0.0–0.7)
Eosinophils Relative: 6.5 % — ABNORMAL HIGH (ref 0.0–5.0)
HCT: 45.8 % (ref 36.0–46.0)
Hemoglobin: 15.2 g/dL — ABNORMAL HIGH (ref 12.0–15.0)
Lymphocytes Relative: 25.1 % (ref 12.0–46.0)
Lymphs Abs: 1.9 10*3/uL (ref 0.7–4.0)
MCHC: 33.3 g/dL (ref 30.0–36.0)
MCV: 91.1 fl (ref 78.0–100.0)
Monocytes Absolute: 0.4 10*3/uL (ref 0.1–1.0)
Monocytes Relative: 5.7 % (ref 3.0–12.0)
Neutro Abs: 4.7 10*3/uL (ref 1.4–7.7)
Neutrophils Relative %: 61.8 % (ref 43.0–77.0)
Platelets: 272 10*3/uL (ref 150.0–400.0)
RBC: 5.03 Mil/uL (ref 3.87–5.11)
RDW: 13.1 % (ref 11.5–14.6)
WBC: 7.6 10*3/uL (ref 4.5–10.5)

## 2012-07-09 LAB — BASIC METABOLIC PANEL
BUN: 12 mg/dL (ref 6–23)
CO2: 27 mEq/L (ref 19–32)
Calcium: 8.9 mg/dL (ref 8.4–10.5)
Chloride: 107 mEq/L (ref 96–112)
Creatinine, Ser: 0.8 mg/dL (ref 0.4–1.2)
GFR: 83.78 mL/min (ref >60.00)
Glucose, Bld: 105 mg/dL — ABNORMAL HIGH (ref 70–99)
Potassium: 3.9 mEq/L (ref 3.5–5.1)
Sodium: 142 mEq/L (ref 135–145)

## 2012-07-09 LAB — TSH: TSH: 5.91 u[IU]/mL — ABNORMAL HIGH (ref 0.35–5.50)

## 2012-07-09 LAB — LDL CHOLESTEROL, DIRECT: Direct LDL: 170.4 mg/dL

## 2012-07-09 MED ORDER — ALBUTEROL SULFATE HFA 108 (90 BASE) MCG/ACT IN AERS: 2.0000 | INHALATION_SPRAY | RESPIRATORY_TRACT | Status: DC | PRN

## 2012-07-09 MED ORDER — PREDNISONE 20 MG PO TABS: ORAL_TABLET | ORAL | Status: DC

## 2012-07-09 MED ORDER — RANITIDINE HCL 75 MG PO TABS: 75.0000 mg | ORAL_TABLET | Freq: Every evening | ORAL | Status: DC | PRN

## 2012-07-09 MED ORDER — PANTOPRAZOLE SODIUM 40 MG PO TBEC: 40.0000 mg | DELAYED_RELEASE_TABLET | Freq: Every day | ORAL | Status: DC

## 2012-07-09 MED ORDER — THYROID 60 MG PO TABS: ORAL_TABLET | ORAL | Status: DC

## 2012-07-09 MED ORDER — SIMVASTATIN 40 MG PO TABS: 40.0000 mg | ORAL_TABLET | Freq: Every day | ORAL | Status: DC

## 2012-07-09 MED ORDER — FLUTICASONE-SALMETEROL 500-50 MCG/DOSE IN AEPB: 1.0000 | INHALATION_SPRAY | Freq: Two times a day (BID) | RESPIRATORY_TRACT | Status: DC

## 2012-07-09 NOTE — Progress Notes (Signed)
Subjective:    Patient ID: Amanda [name], female    DOB: [birth year], 56 y.o.   MRN: [id number]  HPI 56 y/o WF here for a follow up visit... she has multiple medical problems as noted below... she was prev followed by DrWelty-Wolfe at [facility] for her Asthma, then switched to [facility], but both of them have left... I tried to get her to see Dr. Tod Persia but pt has refused further referrals for her Asthma... the concern has been for the developement of fixed airway obstruction from her Asthma due to poor control and medication non-compliance in the past...   ~  June 22, 2009:  last seen 7/09 for med refills & refused referral to the [facility]... states that she's had a good year x for left side/ flank pain in 12/09 that turned out to be Shingles- Rx w/ Valtrex/ Pred & finally resolved... states she's been using her Advair500 twice daily + rescue inhaler several times per week... notes mild exac over the last 2 weeks- denies infection, etc... we discussed Rx w/ Medrol Dosepak & given samples of Symbicort/ Dulera to see if they work any better.  ~  January 12, 2010:  she requests that we start following her Chol & Thyroid since her LMD DrDoerr (Endocrine) from [place] has left... pt was taking Simva40 & Armour Thyroid 60mg  but admits to intermittent dosing & hasn't taken in some time she says... similarly she is only using the Advair once daily & only uses the Nexium Prn for pain... she is not fasting today & we decided to refill meds as she requested, encourage regular dosing of all medications, and ROV later in the fasting state for blood work.  ~  July 16, 2010:  she notes some incr SOB, esp in the AM & w/ activities- better as the day goes on... she has tenderness in the ribs & chest wall w/ mult trigger points, fatigue, not resting well, wakes tired, etc> all raising the poss of Fibromyalgia... we decided to check her CXR (clear, NAD);  PFT (mild obstruction, ?superimposed  restriction?);  & fasting labs (OK x LDL131)...  she refuses Pneumovax & Flu vaccine... advised to take meds regularly, diet, exercise, get weight down...  ~  January 21, 2011:  54mo ROV & states she's been stable, wants refill prescriptions, will be going to [place] on vacation & TDAP given... When asked about her breathing she says "same old stuff" > she uses the Advair just once daily & incr to Bid "if I'm having a hard time" & uses the Ventolin rescue inhaler 0-3 times daily on ave (we again reviewed the need to use the Advair BID regularly as a controller med);  Similarly she takes the Simva40 irregularly but she estimates "most days" & reminded of low chol/ low fat diet needed;  Still on the Armor Thyroid 60mg /tab taking 2 tabs daily as directed by DrDoerr before she left her [facility] practice... She notes some reflux symptoms off & on and is reminded to take the PPI days & H2blocker Qhs... Finally she is asking for the Vit D 50K weekly because she doesn't like taking the OTC supplement daily...  ~  July 30, 2011:  54mo ROV & she reports reasonably stable, no major exac, taking meds as before, & asking for a cough syrup for prn use... As noted last OV- we placed her back on the VitD 50K weekly per her request since she didn't like taking the OTC  supplements daily, but she tells me that stopped the 50K pills as well (just never renewed it)... She has a new kitten at home too, but states that so far her breathing is good "no problem"... She will ret for FASTING blood work==> all looks good (see below).  ~  January 07, 2012:  58mo ROV & Amanda [name] is stable, just getting over a cold she says but breathing is good, at baseline;  We reviewed her prob list, meds, xrays & labs> see below>>   ~  July 09, 2012:  58mo ROV & she states her Asthma has been acting up for the last several months; she has 3 cats- but all outdoors now; I could not find prev IgE/ RAST testing on her to eval for XOLAIR (?if done at [facility] in the past);  she continues on Advair500Bid + AlbutHFA which she proudly notes that she doesn't use that much anymore- "just 4 times daily- not as much as usual"; she denies much cough, sputum, chest discomfort, etc;  She remains on Protonix/ Zantac & antireflux regimen; she freely admits to intermittent dosing of all her meds- "not regularly, more or less";  We discussed Rx w/ Pred course (she refuses Depo shot)...     We reviewed prob list, meds, xrays and labs> see below for updates >> she declines Flu vaccine; requests refill of all meds today... LABS 10/13:  FLP- not at goals on Simva40 (taking it intermittently);  Chems- wnl;  CBC- wnl;  TSH=5.91 x not taking ArmourThyroid daily... IgE level = 68 & RAST pos for Cat> Dust> Dog> molds...  We decided to apply for [facility] Rx...        Problem List:  ALLERGIC RHINITIS (ICD-477.9)  ++allergy testing by DrESL in the 1980's w/ reactions to grasses, ragweed, dust, molds, cats, dog... prev on shots... ~  10/12:  She reports a new kitten at home, but denies allergy or asthma exac so far... ~  10/13:  I cannot find documentation of IgE/ RAST testing (?done at [facility] in the past, she was in a drug trial 2006 at [facility]); we sent labs today> IgE=  68 & RAST pos for Cat> Dust> Dog> molds...  We decided to apply for [facility] Rx...  ASTHMA (ICD-493.90) - on ADVAIR 500Bid, & VENTOLIN HFA as needed... she admits to using Advair once dailly & is encouraged to take meds regularly as perscribed; similarly she overuses the rescue inhaler & admonished to not use it more than Qid... ~  baseline CXR shows sl incr markings, NAD... ~  PFT's 5/05 showed FVC= 2.47 (76%), FEV1= 1.51 (56%), %1sec=61, mid-flows= 22%pred... ~  in 2006 she entered the EXTRA trial at [facility] which was a double blind trial testing XOLAIR... ~  last note from [facility] 6/07 DrCarraway w/ concern for her fixed airway obstruction... pt refused f/u in the [facility] Center... ~  f/u PFT 10/11 showed FVC= 2.52 (71%), FEV1= 1.69 (60%),  %1sec=67, mid-flows= 38%pred. ~  CXR 10/11 showed clear lungs, NAD... ~  10/13:  Treated for exac w/ Pred course; rechecked labs including IgE= 68 & RAST pos for Cat> Dust> Dog> molds...  We decided to apply for [facility] Rx...  HYPERLIPIDEMIA (ICD-272.4) - prev followed by DrDoerr, Endocrinology [place]... prev on Zetia, now on SIMVASTATIN 40mg /d but admits to intermittent dosing. ~  4/11: pt presents w/ request to start writing Simva Rx & monitor labs since DrDoerr has left [facility]... encouraged to take med regularly & follow low chol/ low fat diet... ~  FLP 10/11 on Simva40 showed TChol 198, TG 110, HDL 46, LDL 131 ~  FLP 10/12 on Simva40 showed TChol 172, TG 82, HDL 51, LDL 105... Improved, reminded of diet & compliance. ~  FLP 10/13 on Simva40 (not taking daily) showed TChol 229, TG 60, HDL 48, LDL 170==> rec to take med daily & refer to [facility]!  HYPOTHYROIDISM (ICD-244.9) - taking ARMOUR THYROID 60mg - 2tabs daily... she was followed by DrDoerr, Endocrine in [place] for thyroid and her hyperlipidemia... she was prev on Levothyroid and had requested change to Armour Thyroid because she believed the Levothy was making her hair fall out... ~  4/11:  pt reqested that we write her Rx for the Armour Thyroid 60mg  since DrDoerr has left [facility]... pt is uncouraged to take the med daily. ~  labs 10/11 showed TSH= 4.69 ~  Labs 10/12 on ArmourThyroid60-2/d showed TSH= 2.88... rec to continue same. ~  Labs 10/13 on ArmourThyroid60-2/d (not taking daily) showed TSH= 5.91==> rec to take med every day!!!  OVERWEIGHT (ICD-278.02) - we reviewed diet + exercise program required to lose weight... ~  weight 9/10 = 200#,  she is 5\' 5"  tall,  BMI= 34. ~  weight 4/11 = 192#,  this is as good as she's been in several yrs. ~  weight 10/11 = 197# ~  Weight 4/12 = 200# ~  Weight 10/12 = 201# ~  Weight 4/13 = 199# ~  Weight 10/13 = 194#  HIATAL HERNIA WITH REFLUX (ICD-553.3) - supposed to be on PROTONIX 40mg /d, but she  notes that she only takes it when she hurts... she continues to treat her reflux as needed w/ the Nexium and OTC Zantac even though we have felt this plays a signif roll in her Asthma... the consultants at [facility] agreed, but were no more effective at getting her to take her meds regularly... 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 ~  meds re-written & encouraged to take PROTONIX 40mg /d- 30 min before the 1st meal, and Zantac 75- 1-2 at bedtime...  GYN = DrRomaine in the past... pt states that she stopped going because she couldn't help her w/ her hot flashes.  OSTEOARTHRITIS (ICD-715.90) - she had a left knee arthroscopy by DrPaul in 1999...  VITAMIN D DEFICIENCY (ICD-268.9) - pt was on Vit D 50,000 u weekly from DrDoerr... we don't have prev labs from her but pt indicates that the Vit D level was "really really low" so we will refill the Rx & f/u Vit D level later. ~  labs 10/11 showed Vit D level = 44... OK to switch to 1-2000u daily OTC supplement. ~  4/12:  Pt requested to go back on Vit D 50000u weekly... ~  10/12:  Pt indicated that she stopped filling the Rx for Vit D 50K weekly, ?why?  ANXIETY (ICD-300.00)  Hx of SHINGLES (ICD-053.9) - she presented w/ cryptic left flank pain 12/09 that turned out to be Shingles... treated w/ Valtrex, Pred, Vicodin.  HEALTH MAINTENANCE:   ~  GI:  She has not established w/ a GI for routine screening colonoscopy- reminded again about this important screening procedure! ~  GYN:  GYN = DrRomaine in the past... pt states that she stopped going because she couldn't help her w/ her hot flashes; she is reminded of the need to establish w/ another GYN for PAP, Mammograms, BMD etc... ~  Immuniz:  she refuses Pneumovax & Flu vaccine.Marland KitchenMarland Kitchen  She received TDAP 2012 prior to a trip to [place].   Past Surgical History  Procedure Date  . Left knee arthroscopy 1999    by Dr. Renae Fickle  . Breast reductions  2000    in [place]    Outpatient Encounter Prescriptions as of 07/09/2012  Medication Sig Dispense Refill  . albuterol (VENTOLIN HFA) 108 (90 BASE) MCG/ACT inhaler Inhale 2 puffs into the lungs every 4 (four) hours as needed.  1 Inhaler  11  . aspirin 81 MG tablet Take 81 mg by mouth daily as needed.      . cetirizine (ZYRTEC) 10 MG tablet Take 10 mg by mouth daily as needed.      . Fluticasone-Salmeterol (ADVAIR DISKUS) 500-50 MCG/DOSE AEPB Inhale 1 puff into the lungs every 12 (twelve) hours.  60 each  11  . pantoprazole (PROTONIX) 40 MG tablet Take 1 tablet (40 mg total) by mouth daily.  30 tablet  11  . Pseudoephedrine-Ibuprofen (ADVIL COLD & SINUS LIQUI-GELS) 30-200 MG CAPS As needed      . ranitidine (ZANTAC) 75 MG tablet Take 1 tablet (75 mg total) by mouth at bedtime as needed.  30 tablet  11  . simvastatin (ZOCOR) 40 MG tablet Take 1 tablet (40 mg total) by mouth at bedtime.  30 tablet  11  . thyroid (ARMOUR) 60 MG tablet Take 2 tablets by mouth once daily  60 tablet  11    Allergies  Allergen Reactions  . Azithromycin     zpak does not work for the pt    Current Medications, Allergies, Past Medical History, Past Surgical History, Family History, and Social History were reviewed in [employer] record.    Review of Systems       See HPI - all other systems neg except as noted...      The patient complains of dyspnea on exertion.  The patient denies anorexia, fever, weight loss, weight gain, vision loss, decreased hearing, hoarseness, chest pain, syncope, peripheral edema, prolonged cough, headaches, hemoptysis, abdominal pain, melena, hematochezia, severe indigestion/heartburn, hematuria, incontinence, muscle weakness, suspicious skin lesions, transient blindness, difficulty walking, depression, unusual weight change, abnormal bleeding, enlarged lymph nodes, and angioedema.     Objective:   Physical Exam      WD, overweight, 56 y/o WF in  [place]... GENERAL:  Alert & oriented; & cooperative... HEENT:  [place]/AT, EOM-wnl, PERRLA, Fundi-benign, EACs-clear, TMs-wnl, NOSE-clear, THROAT-clear & wnl. NECK:  Supple w/ full ROM; no JVD; normal carotid impulses w/o bruits; no thyromegaly or nodules palpated; no lymphadenopathy. CHEST:  Clear to P & A; decr BS bilat; without wheezes/ rales/ or rhonchi heard... HEART:  Regular Rhythm; without murmurs/ rubs/ or gallops detected... ABDOMEN:  Soft & nontender; normal bowel sounds; no organomegaly or masses palpated... EXT: without deformities, mild arthritic changes; no varicose veins/ venous insuffic/ or edema. NEURO:  CN's intact; no focal neuro deficits... DERM:  No lesions noted; no rash etc...  RADIOLOGY DATA:  Reviewed in the EPIC EMR & discussed w/ the patient...  LABORATORY DATA:  Reviewed in the EPIC EMR & discussed w/ the patient...   Assessment & Plan:    ASTHMA>  Again asked to use the Advair Bid regularly & gauge control by NOT having to use the rescue inhaler... IgE= 68 w/ pos RAST to Cats, Dust, Dog, some molds & we decided to apply for Xolair treatment...  HYPERLIPID>  On Simva40 & asked to take it nightly to gauge it's effectiveness; FLP improved  w/ more regular dosing; Needs better diet & wt loss...  HYPOTHYROID>  On Armor Thyroid which she insists upon (prev from DrDoerr); TSH looks good; Continue same...  HH/ REFLUX>  Asked to be diligent w/ antireflux measures including the Protonix & Zantac as prescribed...  Vit D Deif>  Asked to stay on the 50K weekly Rx...  OVERWEIGHT>  We reviewed diet + exercise needed to lose wt...  Other medical issues as noted...  Patient's Medications  New Prescriptions   PREDNISONE (DELTASONE) 20 MG TABLET    Take 1 tab bid x 4 days, 1 daily x 4 days, 1/2 daily x 4 days, 1/2 tab every other day until gone  Previous Medications   ASPIRIN 81 MG TABLET    Take 81 mg by mouth daily as needed.   CETIRIZINE (ZYRTEC) 10 MG TABLET    Take 10 mg  by mouth daily as needed.   PSEUDOEPHEDRINE-IBUPROFEN (ADVIL COLD & SINUS LIQUI-GELS) 30-200 MG CAPS    As needed  Modified Medications   Modified Medication Previous Medication   ALBUTEROL (VENTOLIN HFA) 108 (90 BASE) MCG/ACT INHALER albuterol (VENTOLIN HFA) 108 (90 BASE) MCG/ACT inhaler      Inhale 2 puffs into the lungs every 4 (four) hours as needed.    Inhale 2 puffs into the lungs every 4 (four) hours as needed.   FLUTICASONE-SALMETEROL (ADVAIR DISKUS) 500-50 MCG/DOSE AEPB Fluticasone-Salmeterol (ADVAIR DISKUS) 500-50 MCG/DOSE AEPB      Inhale 1 puff into the lungs every 12 (twelve) hours.    Inhale 1 puff into the lungs every 12 (twelve) hours.   PANTOPRAZOLE (PROTONIX) 40 MG TABLET pantoprazole (PROTONIX) 40 MG tablet      Take 1 tablet (40 mg total) by mouth daily.    Take 1 tablet (40 mg total) by mouth daily.   RANITIDINE (ZANTAC) 75 MG TABLET ranitidine (ZANTAC) 75 MG tablet      Take 1 tablet (75 mg total) by mouth at bedtime as needed.    Take 1 tablet (75 mg total) by mouth at bedtime as needed.   SIMVASTATIN (ZOCOR) 40 MG TABLET simvastatin (ZOCOR) 40 MG tablet      Take 1 tablet (40 mg total) by mouth at bedtime.    Take 1 tablet (40 mg total) by mouth at bedtime.   THYROID (ARMOUR) 60 MG TABLET thyroid (ARMOUR) 60 MG tablet      Take 2 tablets by mouth once daily    Take 2 tablets by mouth once daily  Discontinued Medications   No medications on file

## 2012-07-09 NOTE — Patient Instructions (Addendum)
Today we updated your med list in our EPIC system...    Continue your current medications the same...    We refilled your meds per request...  We also decided to treat the recent Asthma exacerbation w/ a course of Prednisone for the inflammation>    Take the Prednisone 20mg - one tab twice daily x 4d, then once daily x4d, then 1/2 tab daily x4d, then 1/2 tab every other day til gone...  Today we did your follow up FASTING blood work + the IgE level & RAST test to screen for poss XOLAIR therapy for your asthma...    Activate your "My Chart" portal & we will send your results via computer, otherw we will call w/ any changes in therapy...  Call for any questions...  Let's plan a routine follow up in 6 months, sooner if needed for any problems.Marland KitchenMarland Kitchen

## 2012-07-13 LAB — ALLERGY FULL PROFILE
Allergen, D pternoyssinus,d7: 0.33 kU/L — ABNORMAL HIGH
Allergen,Goose feathers, e70: <0.1 kU/L
Alternaria Alternata: 0.15 kU/L — ABNORMAL HIGH
Aspergillus fumigatus, IgG: <0.1 kU/L
Bahia Grass: <0.1 kU/L
Bermuda Grass: <0.1 kU/L
Box Elder IgE: <0.1 kU/L
Candida Albicans: <0.1 kU/L
Cat Dander: 10.1 kU/L — ABNORMAL HIGH
Common Ragweed: <0.1 kU/L
Curvularia lunata: <0.1 kU/L
D. farinae: 0.51 kU/L — ABNORMAL HIGH
Dog Dander: 1.11 kU/L — ABNORMAL HIGH
Elm IgE: <0.1 kU/L
Fescue: <0.1 kU/L
G005 Rye, Perennial: <0.1 kU/L
G009 Red Top: <0.1 kU/L
Goldenrod: <0.1 kU/L
Helminthosporium halodes: <0.1 kU/L
House Dust Hollister: 3.9 kU/L — ABNORMAL HIGH
IgE (Immunoglobulin E), Serum: 68.3 IU/mL (ref 0.0–180.0)
Lamb's Quarters: <0.1 kU/L
Oak: <0.1 kU/L
Plantain: <0.1 kU/L
Stemphylium Botryosum: <0.1 kU/L
Sycamore Tree: <0.1 kU/L
Timothy Grass: <0.1 kU/L

## 2012-07-14 ENCOUNTER — Encounter: Payer: Self-pay | Admitting: Pulmonary Disease

## 2012-07-15 ENCOUNTER — Other Ambulatory Visit: Payer: Self-pay | Admitting: Pulmonary Disease

## 2012-07-15 DIAGNOSIS — J45909 Unspecified asthma, uncomplicated: Secondary | ICD-10-CM

## 2012-07-15 DIAGNOSIS — E785 Hyperlipidemia, unspecified: Secondary | ICD-10-CM

## 2012-07-20 ENCOUNTER — Ambulatory Visit: Payer: BC Managed Care – PPO | Admitting: Pharmacist

## 2012-07-21 ENCOUNTER — Ambulatory Visit: Payer: BC Managed Care – PPO | Admitting: Pharmacist

## 2012-08-27 ENCOUNTER — Ambulatory Visit (INDEPENDENT_AMBULATORY_CARE_PROVIDER_SITE_OTHER): Payer: BC Managed Care – PPO

## 2012-08-27 DIAGNOSIS — J45909 Unspecified asthma, uncomplicated: Secondary | ICD-10-CM

## 2012-08-28 MED ORDER — OMALIZUMAB 150 MG ~~LOC~~ SOLR
150.0000 mg | Freq: Once | SUBCUTANEOUS | Status: AC
  Administered 2012-08-28: 150 mg via SUBCUTANEOUS

## 2012-11-05 ENCOUNTER — Telehealth: Payer: Self-pay | Admitting: Pulmonary Disease

## 2012-11-05 NOTE — Telephone Encounter (Signed)
Because of her deductible until it's met she would have to pay out of pocket for her Amanda [name] [facility]

## 2013-01-07 ENCOUNTER — Encounter: Payer: Self-pay | Admitting: Pulmonary Disease

## 2013-01-07 ENCOUNTER — Ambulatory Visit (INDEPENDENT_AMBULATORY_CARE_PROVIDER_SITE_OTHER): Payer: BC Managed Care – PPO | Admitting: Pulmonary Disease

## 2013-01-07 VITALS — BP 132/84 | HR 71 | Temp 98.2°F | Ht 65.0 in | Wt 201.2 lb

## 2013-01-07 DIAGNOSIS — F411 Generalized anxiety disorder: Secondary | ICD-10-CM

## 2013-01-07 DIAGNOSIS — E559 Vitamin D deficiency, unspecified: Secondary | ICD-10-CM

## 2013-01-07 DIAGNOSIS — J309 Allergic rhinitis, unspecified: Secondary | ICD-10-CM

## 2013-01-07 DIAGNOSIS — J45909 Unspecified asthma, uncomplicated: Secondary | ICD-10-CM

## 2013-01-07 DIAGNOSIS — E663 Overweight: Secondary | ICD-10-CM

## 2013-01-07 DIAGNOSIS — K449 Diaphragmatic hernia without obstruction or gangrene: Secondary | ICD-10-CM

## 2013-01-07 DIAGNOSIS — E039 Hypothyroidism, unspecified: Secondary | ICD-10-CM

## 2013-01-07 DIAGNOSIS — M1990 Unspecified osteoarthritis, unspecified site: Secondary | ICD-10-CM

## 2013-01-07 DIAGNOSIS — E785 Hyperlipidemia, unspecified: Secondary | ICD-10-CM

## 2013-01-07 MED ORDER — ESOMEPRAZOLE MAGNESIUM 40 MG PO CPDR: 40.0000 mg | DELAYED_RELEASE_CAPSULE | Freq: Every day | ORAL | Status: DC

## 2013-01-07 MED ORDER — MONTELUKAST SODIUM 10 MG PO TABS: 10.0000 mg | ORAL_TABLET | Freq: Every day | ORAL | Status: DC

## 2013-01-07 NOTE — Progress Notes (Signed)
Subjective:    Patient ID: Amanda [name], female    DOB: 1956-08-12, 57 y.o.   MRN: [id number]  HPI 57 y/o WF here for a follow up visit... she has multiple medical problems as noted below... she was prev followed by DrWelty-Wolfe at [facility] for her Asthma, then switched to [facility], but both of them have left... I tried to get her to see Dr. Tod Persia but pt has refused further referrals for her Asthma... the concern has been for the developement of fixed airway obstruction from her Asthma due to poor control and medication non-compliance in the past...   ~  July 30, 2011:  24mo ROV & she reports reasonably stable, no major exac, taking meds as before, & asking for a cough syrup for prn use... As noted last OV- we placed her back on the VitD 50K weekly per her request since she didn't like taking the OTC supplements daily, but she tells me that stopped the 50K pills as well (just never renewed it)... She has a new kitten at home too, but states that so far her breathing is good "no problem"... She will ret for FASTING blood work==> all looks good (see below).  ~  January 07, 2012:  24mo ROV & Amanda [name] is stable, just getting over a cold she says but breathing is good, at baseline;  We reviewed her prob list, meds, xrays & labs> see below>>   ~  July 09, 2012:  24mo ROV & she states her Asthma has been acting up for the last several months; she has 3 cats- but all outdoors now; I could not find prev IgE/ RAST testing on her to eval for XOLAIR (?if done at [facility] in the past); she continues on Advair500Bid + AlbutHFA which she proudly notes that she doesn't use that much anymore- "just 4 times daily- not as much as usual"; she denies much cough, sputum, chest discomfort, etc;  She remains on Protonix/ Zantac & antireflux regimen; she freely admits to intermittent dosing of all her meds- "not regularly, more or less";  We discussed Rx w/ Pred course (she refuses Depo shot)...     We reviewed  prob list, meds, xrays and labs> see below for updates >> she declines Flu vaccine; requests refill of all meds today... LABS 10/13:  FLP- not at goals on Simva40 (taking it intermittently);  Chems- wnl;  CBC- wnl;  TSH=5.91 x not taking ArmourThyroid daily... IgE level = 68 & RAST pos for Cat> Dust> Dog> molds...  We decided to apply for [facility] Rx => unfortunately they could not get payment arranged thru her insurance.  ~  January 07, 2013:  24mo ROV & Amanda [name] has been doing satis by her hx- notes some allergy issues & we discussed adding singulair trial; also requests change from Protonix to Nexium... We reviewed the following medical problems during today's office visit >>      AR, Asthma> on Zyrtek10, Advair500, ProairHFA; she is currently off Pred & refuses nasal sprays; notes Asthma controlled but worries about the pollen in the spring- add Singulair10 trial; she is an excellent Xolair cand but they couldn't get it worked out w/ her insurance...    Hyperlipid> on Simva40; last FLP 10/13 showed TChol 229, TG 60, HDL 48, LDL 170=> she admits to taking it intermittently...    Hypothyroid> on ArmourThyroid60-2 tabs/d & she refuses Synthroid Rx; TSH 10/13 = 5.91 & reminded to take it every day...    Overweight> wt  up to 201# (up 7# in last 3mo) & we reviewed diet, exercise, wt reduction strategies...    HH, Reflux> on Protonix40 + Zantac as needed but requests change to Nexium40; she knows to use max antireflux regimen w/ elev HOB, NPO after dinner, etc...    GYN> prev seen by [facility] but she stopped going when they didn't help her hot flashes; she is again advised to estab w/ another GYN for Pelvic, Pap, Mammography, BMD, etc...     DJD> hx left knee pain & arthroscopy 1999 by DrPaul; she uses OTC analgesics as needed...    VitD defic> intermittently on VitD50K weekly from prev [facility]; stopped on her own in favor of OTC VitD supplement but she stopped that too; last VitD level= 44 in 2011...     Anxiety> offered alpraz or similar Benzo in past but she declines med rx...  We reviewed prob list, meds, xrays and labs> see below for updates >>  Last Labs 10/13> reviewed...        Problem List:  ALLERGIC RHINITIS (ICD-477.9)  ++allergy testing by DrESL in the 1980's w/ reactions to grasses, ragweed, dust, molds, cats, dog... prev on shots... ~  10/12:  She reports a new kitten at home, but denies allergy or asthma exac so far... ~  10/13:  I cannot find documentation of IgE/ RAST testing (?done at [facility] in the past, she was in a drug trial 2006 at [facility]); we sent labs today> IgE=  68 & RAST pos for Cat> Dust> Dog> molds...  We decided to apply for [facility] Rx => they could not secure payment from her insurance...  ASTHMA (ICD-493.90) - on ADVAIR 500Bid, & VENTOLIN HFA as needed... she admits to using Advair once dailly & is encouraged to take meds regularly as perscribed; similarly she overuses the rescue inhaler & admonished to not use it more than Qid... ~  baseline CXR shows sl incr markings, NAD... ~  PFT's 5/05 showed FVC= 2.47 (76%), FEV1= 1.51 (56%), %1sec=61, mid-flows= 22%pred... ~  in 2006 she entered the EXTRA trial at [facility] which was a double blind trial testing XOLAIR... ~  last note from [facility] 6/07 DrCarraway w/ concern for her fixed airway obstruction... pt refused f/u in the [facility]... ~  f/u PFT 10/11 showed FVC= 2.52 (71%), FEV1= 1.69 (60%), %1sec=67, mid-flows= 38%pred. ~  CXR 10/11 showed clear lungs, NAD... ~  10/13:  Treated for exac w/ Pred course; rechecked labs including IgE= 68 & RAST pos for Cat> Dust> Dog> molds... We decided to apply for [facility] Rx but they could not arrange payment from her insurance... She states that the 1st shot really helped...  HYPERLIPIDEMIA (ICD-272.4) - prev followed by DrDoerr, Endocrinology in [place]... prev on Zetia, now on SIMVASTATIN 40mg /d but admits to intermittent dosing. ~  4/11: pt presents w/ request to start writing Simva Rx &  monitor labs since DrDoerr has left [place]... encouraged to take med regularly & follow low chol/ low fat diet... ~  FLP 10/11 on Simva40 showed TChol 198, TG 110, HDL 46, LDL 131 ~  FLP 10/12 on Simva40 showed TChol 172, TG 82, HDL 51, LDL 105... Improved, reminded of diet & compliance. ~  FLP 10/13 on Simva40 (not taking daily) showed TChol 229, TG 60, HDL 48, LDL 170==> rec to take med daily & refer to [facility]!  HYPOTHYROIDISM (ICD-244.9) - taking ARMOUR THYROID 60mg - 2tabs daily... she was followed by DrDoerr, Endocrine in [place] for thyroid and her  hyperlipidemia... she was prev on Levothyroid and had requested change to Armour Thyroid because she believed the Levothy was making her hair fall out... ~  4/11:  pt reqested that we write her Rx for the Armour Thyroid 60mg  since DrDoerr has left [place]... pt is uncouraged to take the med daily. ~  labs 10/11 showed TSH= 4.69 ~  Labs 10/12 on ArmourThyroid60-2/d showed TSH= 2.88... rec to continue same. ~  Labs 10/13 on ArmourThyroid60-2/d (not taking daily) showed TSH= 5.91==> rec to take med every day!!!  OVERWEIGHT (ICD-278.02) - we reviewed diet + exercise program required to lose weight... ~  weight 9/10 = 200#,  she is 5\' 5"  tall,  BMI= 34. ~  weight 4/11 = 192#,  this is as good as she's been in several yrs. ~  weight 10/11 = 197# ~  Weight 4/12 = 200# ~  Weight 10/12 = 201# ~  Weight 4/13 = 199# ~  Weight 10/13 = 194# ~  Weight 4/14 = 201#  HIATAL HERNIA WITH REFLUX (ICD-553.3) - supposed to be on PROTONIX 40mg /d, but she notes that she only takes it when she hurts... she continues to treat her reflux as needed w/ the Nexium and OTC Zantac even though we have felt this plays a signif roll in her Asthma... the consultants at [facility] agreed, but were no more effective at getting her to take her meds regularly... 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 ~  meds re-written &  encouraged to take PROTONIX 40mg /d- 30 min before the 1st meal, and Zantac 75- 1-2 at bedtime... ~  4/14: she requests switch to NEXIUM40 from the Protonix...  GYN = DrRomaine in the past... pt states that she stopped going because she couldn't help her w/ her hot flashes. ~  4/14: reminded of the importance of regular Gyn checks- pelvic, Pap, Mammograms, etc...  OSTEOARTHRITIS (ICD-715.90) - she had a left knee arthroscopy by DrPaul in 1999...  VITAMIN D DEFICIENCY (ICD-268.9) - pt was on Vit D 50,000 u weekly from DrDoerr... we don't have prev labs from her but pt indicates that the Vit D level was "really really low" so we will refill the Rx & f/u Vit D level later. ~  labs 10/11 showed Vit D level = 44... OK to switch to 1-2000u daily OTC supplement. ~  4/12:  Pt requested to go back on Vit D 50000u weekly... ~  10/12:  Pt indicated that she stopped filling the Rx for Vit D 50K weekly, ?why?  ANXIETY (ICD-300.00)  Hx of SHINGLES (ICD-053.9) - she presented w/ cryptic left flank pain 12/09 that turned out to be Shingles... treated w/ Valtrex, Pred, Vicodin.  HEALTH MAINTENANCE:   ~  GI:  She has not established w/ a GI for routine screening colonoscopy- reminded again about this important screening procedure! ~  GYN:  GYN = DrRomaine in the past... pt states that she stopped going because she couldn't help her w/ her hot flashes; she is reminded of the need to establish w/ another GYN for PAP, Mammograms, BMD etc... ~  Immuniz:  she refuses Pneumovax & Flu vaccine... She received TDAP 2012 prior to a trip to [place].   Past Surgical History  Procedure Laterality Date  . Left knee arthroscopy  1999    by Dr. Renae Fickle  . Breast reductions  2000    in [place]    Outpatient Encounter Prescriptions as  of 01/07/2013  Medication Sig Dispense Refill  . albuterol (VENTOLIN HFA) 108 (90 BASE) MCG/ACT inhaler Inhale 2 puffs into the lungs every 4 (four) hours as needed.  1 Inhaler  11  .  cetirizine (ZYRTEC) 10 MG tablet Take 10 mg by mouth daily as needed.      . Fluticasone-Salmeterol (ADVAIR DISKUS) 500-50 MCG/DOSE AEPB Inhale 1 puff into the lungs every 12 (twelve) hours.  60 each  11  . pantoprazole (PROTONIX) 40 MG tablet Take 1 tablet (40 mg total) by mouth daily.  30 tablet  11  . Pseudoephedrine-Ibuprofen (ADVIL COLD & SINUS LIQUI-GELS) 30-200 MG CAPS As needed      . ranitidine (ZANTAC) 75 MG tablet Take 1 tablet (75 mg total) by mouth at bedtime as needed.  30 tablet  11  . simvastatin (ZOCOR) 40 MG tablet Take 1 tablet (40 mg total) by mouth at bedtime.  30 tablet  11  . thyroid (ARMOUR) 60 MG tablet Take 2 tablets by mouth once daily  60 tablet  11  . aspirin 81 MG tablet Take 81 mg by mouth daily as needed.      . [DISCONTINUED] predniSONE (DELTASONE) 20 MG tablet Take 1 tab bid x 4 days, 1 daily x 4 days, 1/2 daily x 4 days, 1/2 tab every other day until gone  16 tablet  0   No facility-administered encounter medications on file as of 01/07/2013.    Allergies  Allergen Reactions  . Azithromycin     zpak does not work for the pt    Current Medications, Allergies, Past Medical History, Past Surgical History, Family History, and Social History were reviewed in [employer] record.    Review of Systems       See HPI - all other systems neg except as noted...      The patient complains of dyspnea on exertion.  The patient denies anorexia, fever, weight loss, weight gain, vision loss, decreased hearing, hoarseness, chest pain, syncope, peripheral edema, prolonged cough, headaches, hemoptysis, abdominal pain, melena, hematochezia, severe indigestion/heartburn, hematuria, incontinence, muscle weakness, suspicious skin lesions, transient blindness, difficulty walking, depression, unusual weight change, abnormal bleeding, enlarged lymph nodes, and angioedema.     Objective:   Physical Exam      WD, overweight, 57 y/o WF in NAD... GENERAL:   Alert & oriented; & cooperative... HEENT:  Hugo/AT, EOM-wnl, PERRLA, Fundi-benign, EACs-clear, TMs-wnl, NOSE-clear, THROAT-clear & wnl. NECK:  Supple w/ full ROM; no JVD; normal carotid impulses w/o bruits; no thyromegaly or nodules palpated; no lymphadenopathy. CHEST:  Clear to P & A; decr BS bilat; without wheezes/ rales/ or rhonchi heard... HEART:  Regular Rhythm; without murmurs/ rubs/ or gallops detected... ABDOMEN:  Soft & nontender; normal bowel sounds; no organomegaly or masses palpated... EXT: without deformities, mild arthritic changes; no varicose veins/ venous insuffic/ or edema. NEURO:  CN's intact; no focal neuro deficits... DERM:  No lesions noted; no rash etc...  RADIOLOGY DATA:  Reviewed in the EPIC EMR & discussed w/ the patient...  LABORATORY DATA:  Reviewed in the EPIC EMR & discussed w/ the patient...   Assessment & Plan:    ASTHMA>  Again asked to use the Advair Bid regularly & gauge control by NOT having to use the rescue inhaler... IgE= 68 w/ pos RAST to Cats, Dust, Dog, some molds & we decided to apply for Xolair treatment but unfortunately this could not be arranged due to her insurance... 4/14> try Singulair...  HYPERLIPID>  On Simva40 & asked to take it nightly to gauge it's effectiveness; FLP improved w/ more regular dosing; Needs better diet & wt loss...  HYPOTHYROID>  On Armor Thyroid which she insists upon (prev from DrDoerr); TSH looks good; Continue same...  HH/ REFLUX>  Asked to be diligent w/ antireflux measures including the Protonix & Zantac as prescribed; she prefers NEXIUM40- ok...  Vit D Deif>  Asked to stay on the 50K weekly Rx; she stopped on her own...  OVERWEIGHT>  We reviewed diet + exercise needed to lose wt...  Other medical issues as noted...  Patient's Medications  New Prescriptions   ESOMEPRAZOLE (NEXIUM) 40 MG CAPSULE    Take 1 capsule (40 mg total) by mouth daily before breakfast.   MONTELUKAST (SINGULAIR) 10 MG TABLET    Take 1  tablet (10 mg total) by mouth at bedtime.  Previous Medications   ALBUTEROL (VENTOLIN HFA) 108 (90 BASE) MCG/ACT INHALER    Inhale 2 puffs into the lungs every 4 (four) hours as needed.   ASPIRIN 81 MG TABLET    Take 81 mg by mouth daily as needed.   CETIRIZINE (ZYRTEC) 10 MG TABLET    Take 10 mg by mouth daily as needed.   FLUTICASONE-SALMETEROL (ADVAIR DISKUS) 500-50 MCG/DOSE AEPB    Inhale 1 puff into the lungs every 12 (twelve) hours.   PANTOPRAZOLE (PROTONIX) 40 MG TABLET    Take 1 tablet (40 mg total) by mouth daily.   PSEUDOEPHEDRINE-IBUPROFEN (ADVIL COLD & SINUS LIQUI-GELS) 30-200 MG CAPS    As needed   RANITIDINE (ZANTAC) 75 MG TABLET    Take 1 tablet (75 mg total) by mouth at bedtime as needed.   SIMVASTATIN (ZOCOR) 40 MG TABLET    Take 1 tablet (40 mg total) by mouth at bedtime.   THYROID (ARMOUR) 60 MG TABLET    Take 2 tablets by mouth once daily  Modified Medications   No medications on file  Discontinued Medications   PREDNISONE (DELTASONE) 20 MG TABLET    Take 1 tab bid x 4 days, 1 daily x 4 days, 1/2 daily x 4 days, 1/2 tab every other day until gone

## 2013-01-07 NOTE — Patient Instructions (Addendum)
Today we updated your med list in our EPIC system...    Continue your current medications the same...  We will try to sched a Bone Density test (baseline study) for you...  We discussed adding SINGULAIR 10mg /d to help w/ your allergies & asthma this spring...  Call for any questions...  Let's plan a follow up visit in 74mo, sooner if needed for problems.Marland KitchenMarland Kitchen

## 2013-02-17 ENCOUNTER — Ambulatory Visit (INDEPENDENT_AMBULATORY_CARE_PROVIDER_SITE_OTHER)
Admission: RE | Admit: 2013-02-17 | Discharge: 2013-02-17 | Disposition: A | Discharge status: Home / Self Care | Payer: BC Managed Care – PPO | Source: Ambulatory Visit | Attending: Pulmonary Disease | Admitting: Pulmonary Disease

## 2013-02-17 DIAGNOSIS — M1990 Unspecified osteoarthritis, unspecified site: Secondary | ICD-10-CM

## 2013-07-12 ENCOUNTER — Other Ambulatory Visit (INDEPENDENT_AMBULATORY_CARE_PROVIDER_SITE_OTHER): Payer: Managed Care, Other (non HMO)

## 2013-07-12 ENCOUNTER — Ambulatory Visit (INDEPENDENT_AMBULATORY_CARE_PROVIDER_SITE_OTHER): Payer: Managed Care, Other (non HMO) | Admitting: Pulmonary Disease

## 2013-07-12 ENCOUNTER — Encounter: Payer: Self-pay | Admitting: Pulmonary Disease

## 2013-07-12 ENCOUNTER — Other Ambulatory Visit: Payer: Self-pay | Admitting: Pulmonary Disease

## 2013-07-12 VITALS — BP 128/88 | HR 62 | Temp 99.0°F | Ht 65.0 in | Wt 200.6 lb

## 2013-07-12 DIAGNOSIS — K449 Diaphragmatic hernia without obstruction or gangrene: Secondary | ICD-10-CM

## 2013-07-12 DIAGNOSIS — E039 Hypothyroidism, unspecified: Secondary | ICD-10-CM

## 2013-07-12 DIAGNOSIS — E559 Vitamin D deficiency, unspecified: Secondary | ICD-10-CM

## 2013-07-12 DIAGNOSIS — J309 Allergic rhinitis, unspecified: Secondary | ICD-10-CM

## 2013-07-12 DIAGNOSIS — J45909 Unspecified asthma, uncomplicated: Secondary | ICD-10-CM

## 2013-07-12 DIAGNOSIS — E785 Hyperlipidemia, unspecified: Secondary | ICD-10-CM

## 2013-07-12 DIAGNOSIS — M1990 Unspecified osteoarthritis, unspecified site: Secondary | ICD-10-CM

## 2013-07-12 DIAGNOSIS — E663 Overweight: Secondary | ICD-10-CM

## 2013-07-12 DIAGNOSIS — M549 Dorsalgia, unspecified: Secondary | ICD-10-CM

## 2013-07-12 LAB — LIPID PANEL
Cholesterol: 138 mg/dL (ref 0–200)
HDL: 41.5 mg/dL (ref >39.00)
LDL Cholesterol: 81 mg/dL (ref 0–99)
Total CHOL/HDL Ratio: 3
Triglycerides: 78 mg/dL (ref 0.0–149.0)
VLDL: 15.6 mg/dL (ref 0.0–40.0)

## 2013-07-12 LAB — HEPATIC FUNCTION PANEL
ALT: 44 U/L — ABNORMAL HIGH (ref 0–35)
AST: 28 U/L (ref 0–37)
Albumin: 4 g/dL (ref 3.5–5.2)
Alkaline Phosphatase: 83 U/L (ref 39–117)
Bilirubin, Direct: 0.1 mg/dL (ref 0.0–0.3)
Total Bilirubin: 0.6 mg/dL (ref 0.3–1.2)
Total Protein: 6.5 g/dL (ref 6.0–8.3)

## 2013-07-12 LAB — CBC WITH DIFFERENTIAL/PLATELET
Basophils Absolute: 0 10*3/uL (ref 0.0–0.1)
Basophils Relative: 0.5 % (ref 0.0–3.0)
Eosinophils Absolute: 0.5 10*3/uL (ref 0.0–0.7)
Eosinophils Relative: 6.8 % — ABNORMAL HIGH (ref 0.0–5.0)
HCT: 42 % (ref 36.0–46.0)
Hemoglobin: 14.3 g/dL (ref 12.0–15.0)
Lymphocytes Relative: 23.4 % (ref 12.0–46.0)
Lymphs Abs: 1.9 10*3/uL (ref 0.7–4.0)
MCHC: 34 g/dL (ref 30.0–36.0)
MCV: 89.8 fl (ref 78.0–100.0)
Monocytes Absolute: 0.5 10*3/uL (ref 0.1–1.0)
Monocytes Relative: 6.5 % (ref 3.0–12.0)
Neutro Abs: 5 10*3/uL (ref 1.4–7.7)
Neutrophils Relative %: 62.8 % (ref 43.0–77.0)
Platelets: 276 10*3/uL (ref 150.0–400.0)
RBC: 4.68 Mil/uL (ref 3.87–5.11)
RDW: 12.9 % (ref 11.5–14.6)
WBC: 7.9 10*3/uL (ref 4.5–10.5)

## 2013-07-12 LAB — BASIC METABOLIC PANEL
BUN: 9 mg/dL (ref 6–23)
CO2: 28 mEq/L (ref 19–32)
Calcium: 8.7 mg/dL (ref 8.4–10.5)
Chloride: 106 mEq/L (ref 96–112)
Creatinine, Ser: 0.7 mg/dL (ref 0.4–1.2)
GFR: 88.85 mL/min (ref >60.00)
Glucose, Bld: 118 mg/dL — ABNORMAL HIGH (ref 70–99)
Potassium: 3.7 mEq/L (ref 3.5–5.1)
Sodium: 139 mEq/L (ref 135–145)

## 2013-07-12 LAB — TSH: TSH: 0.43 u[IU]/mL (ref 0.35–5.50)

## 2013-07-12 MED ORDER — MONTELUKAST SODIUM 10 MG PO TABS: 10.0000 mg | ORAL_TABLET | Freq: Every day | ORAL | Status: DC

## 2013-07-12 MED ORDER — THYROID 60 MG PO TABS: ORAL_TABLET | ORAL | Status: DC

## 2013-07-12 MED ORDER — RANITIDINE HCL 75 MG PO TABS: 75.0000 mg | ORAL_TABLET | Freq: Every evening | ORAL | Status: DC | PRN

## 2013-07-12 MED ORDER — PANTOPRAZOLE SODIUM 40 MG PO TBEC: 40.0000 mg | DELAYED_RELEASE_TABLET | Freq: Two times a day (BID) | ORAL | Status: DC

## 2013-07-12 MED ORDER — ALBUTEROL SULFATE HFA 108 (90 BASE) MCG/ACT IN AERS: 2.0000 | INHALATION_SPRAY | RESPIRATORY_TRACT | Status: DC | PRN

## 2013-07-12 MED ORDER — FLUTICASONE-SALMETEROL 500-50 MCG/DOSE IN AEPB: 1.0000 | INHALATION_SPRAY | Freq: Two times a day (BID) | RESPIRATORY_TRACT | Status: DC

## 2013-07-12 MED ORDER — PREDNISONE (PAK) 5 MG PO TABS: ORAL_TABLET | ORAL | Status: DC

## 2013-07-12 MED ORDER — SIMVASTATIN 40 MG PO TABS: 40.0000 mg | ORAL_TABLET | Freq: Every day | ORAL | Status: DC

## 2013-07-12 NOTE — Progress Notes (Signed)
Subjective:    Patient ID: Amanda [name], female    DOB: 11/03/1955, 57 y.o.   MRN: [id number]  HPI 57 y/o WF here for a follow up visit... she has multiple medical problems as noted below... she was prev followed by DrWelty-Wolfe at [facility] for her Asthma, then switched to [facility], but both of them have left... I tried to get her to see Dr. Tod Persia but pt has refused further referrals for her Asthma... the concern has been for the developement of fixed airway obstruction from her Asthma due to poor control and medication non-compliance in the past...   ~  January 07, 2012:  5mo ROV & Aleaya is stable, just getting over a cold she says but breathing is good, at baseline;  We reviewed her prob list, meds, xrays & labs> see below>>   ~  July 09, 2012:  5mo ROV & she states her Asthma has been acting up for the last several months; she has 3 cats- but all outdoors now; I could not find prev IgE/ RAST testing on her to eval for XOLAIR (?if done at [facility] in the past); she continues on Advair500Bid + AlbutHFA which she proudly notes that she doesn't use that much anymore- "just 4 times daily- not as much as usual"; she denies much cough, sputum, chest discomfort, etc;  She remains on Protonix/ Zantac & antireflux regimen; she freely admits to intermittent dosing of all her meds- "not regularly, more or less";  We discussed Rx w/ Pred course (she refuses Depo shot)...     We reviewed prob list, meds, xrays and labs> see below for updates >> she declines Flu vaccine; requests refill of all meds today... LABS 10/13:  FLP- not at goals on Simva40 (taking it intermittently);  Chems- wnl;  CBC- wnl;  TSH=5.91 x not taking ArmourThyroid daily... IgE level = 68 & RAST pos for Cat> Dust> Dog> molds...  We decided to apply for [facility] Rx => unfortunately they could not get payment arranged thru her insurance.  ~  January 07, 2013:  5mo ROV & Amanda [name] has been doing satis by her hx- notes some allergy  issues & we discussed adding singulair trial; also requests change from Protonix to Nexium... We reviewed the following medical problems during today's office visit >>     AR, Asthma> on Zyrtek10, Advair500, ProairHFA; she is currently off Pred & refuses nasal sprays; notes Asthma controlled but worries about the pollen in the spring- add Singulair10 trial; she is an excellent Xolair cand but they couldn't get it worked out w/ her insurance...    Hyperlipid> on Simva40; last FLP 10/13 showed TChol 229, TG 60, HDL 48, LDL 170=> she admits to taking it intermittently...    Hypothyroid> on ArmourThyroid60-2 tabs/d & she refuses Synthroid Rx; TSH 10/13 = 5.91 & reminded to take it every day...    Overweight> wt up to 201# (up 7# in last 5mo) & we reviewed diet, exercise, wt reduction strategies...    HH, Reflux> on Protonix40 + Zantac as needed but requests change to Nexium40; she knows to use max antireflux regimen w/ elev HOB, NPO after dinner, etc...    GYN> prev seen by [facility] but she stopped going when they didn't help her hot flashes; she is again advised to estab w/ another GYN for Pelvic, Pap, Mammography, BMD, etc...     DJD> hx left knee pain & arthroscopy 1999 by DrPaul; she uses OTC analgesics as needed.Marland KitchenMarland Kitchen  VitD defic> intermittently on VitD50K weekly from prev Endocrine & Gyn; stopped on her own in favor of OTC VitD supplement but she stopped that too; last VitD level= 44 in 2011...    Anxiety> offered alpraz or similar Benzo in past but she declines med rx... We reviewed prob list, meds, xrays and labs> see below for updates >>  Last Labs 10/13> reviewed...   ~  July 12, 2013:  52mo ROV & [name] is c/o a facial Dermatitis & wants me to prescribe a Pred Dosepak... We reviewed the following medical problems during today's office visit >>     Allergies & Asthma> on Zyrtek, Singulair10, Advair500, AlbutHFA prn; breathing has been good, allergies acting up & she has dermatitis on face- wants  Dosepak- ok...    Chol> on Simva40; FLP 10/14 showed TChol 138, TG 78, HDL 42, LDL 81 & rec to continue same...    Overweight> unchanged at 201# w/ BMI= 33-34; we reviewed diet, exercise, & wt reduction strategies...    GI- HH, Reflux> c/o incr reflux symptoms, offered GI referral but she declines; try incr Protonix40Bid & strict antireflux regimen...     VitD defic> she stopped the VitD 50K weekly Rx on her own; Labs 10/14 showed VitD level = 31 & she is requested to take 2000u OTC supplement daily... We reviewed prob list, meds, xrays and labs> see below for updates >> she declines Flu shot & Pneumovax... Requests refill meds today... LABS 10/14:  FLP- at goals on Simva40;  Chems- ok x BS=118;  CBC- wnl;  TSH=0.43;  VitD=31 & rec to take 2000u daily...         Problem List:  ALLERGIC RHINITIS (ICD-477.9)  ++allergy testing by DrESL in the 1980's w/ reactions to grasses, ragweed, dust, molds, cats, dog... prev on shots... ~  10/12:  She reports a new kitten at home, but denies allergy or asthma exac so far... ~  10/13:  I cannot find documentation of IgE/ RAST testing (?done at [facility] in the past, she was in a drug trial 2006 at [facility]); we sent labs today> IgE=  68 & RAST pos for Cat> Dust> Dog> molds...  We decided to apply for [facility] Rx => they could not secure payment from her insurance... ~  10/14:  on Zyrtek, Singulair10, Advair500, AlbutHFA prn; breathing has been good, allergies acting up & she has dermatitis on face- wants Dosepak- ok  ASTHMA (ICD-493.90) - on ADVAIR 500Bid, & VENTOLIN HFA as needed... she admits to using Advair once dailly & is encouraged to take meds regularly as perscribed; similarly she overuses the rescue inhaler & admonished to not use it more than Qid... ~  baseline CXR shows sl incr markings, NAD... ~  PFT's 5/05 showed FVC= 2.47 (76%), FEV1= 1.51 (56%), %1sec=61, mid-flows= 22%pred... ~  in 2006 she entered the EXTRA trial at [facility] which was a double blind trial  testing XOLAIR... ~  last note from [facility] 6/07 DrCarraway w/ concern for her fixed airway obstruction... pt refused f/u in the [facility]... ~  f/u PFT 10/11 showed FVC= 2.52 (71%), FEV1= 1.69 (60%), %1sec=67, mid-flows= 38%pred. ~  CXR 10/11 showed clear lungs, NAD... ~  10/13:  Treated for exac w/ Pred course; rechecked labs including IgE= 68 & RAST pos for Cat> Dust> Dog> molds... We decided to apply for [facility] Rx but they could not arrange payment from her insurance... She states that the 1st shot really helped... ~  10/14: on Zyrtek, Singulair10, Advair500,  AlbutHFA prn; breathing has been good, allergies acting up & she has dermatitis on face- wants Dosepak- ok.  HYPERLIPIDEMIA (ICD-272.4) - prev followed by DrDoerr, Endocrinology [place]... prev on Zetia, now on SIMVASTATIN 40mg /d but admits to intermittent dosing. ~  4/11: pt presents w/ request to start writing Simva Rx & monitor labs since DrDoerr has left [place]... encouraged to take med regularly & follow low chol/ low fat diet... ~  FLP 10/11 on Simva40 showed TChol 198, TG 110, HDL 46, LDL 131 ~  FLP 10/12 on Simva40 showed TChol 172, TG 82, HDL 51, LDL 105... Improved, reminded of diet & compliance. ~  FLP 10/13 on Simva40 (not taking daily) showed TChol 229, TG 60, HDL 48, LDL 170==> rec to take med daily & refer to [facility]! ~  FLP 10/14 on Simva40 showed TChol 138, TG 78, HDL 42, LDL 81... Great job, continue daily dosing...  HYPOTHYROIDISM (ICD-244.9) - taking ARMOUR THYROID 60mg - 2tabs daily... she was followed by DrDoerr, Endocrine in [place] for thyroid and her hyperlipidemia... she was prev on Levothyroid and had requested change to Armour Thyroid because she believed the Levothy was making her hair fall out... ~  4/11:  pt reqested that we write her Rx for the Armour Thyroid 60mg  since DrDoerr has left [place]... pt is uncouraged to take the med daily. ~  labs 10/11 showed TSH= 4.69 ~  Labs 10/12 on ArmourThyroid60-2/d showed  TSH= 2.88... rec to continue same. ~  Labs 10/13 on ArmourThyroid60-2/d (not taking daily) showed TSH= 5.91==> rec to take med every day!!! ~  Labs 10/14 on ArmourThyroid60-2/d showed TSH=0.43  OVERWEIGHT (ICD-278.02) - we reviewed diet + exercise program required to lose weight... ~  weight 9/10 = 200#,  she is 5\' 5"  tall,  BMI= 34. ~  weight 4/11 = 192#,  this is as good as she's been in several yrs. ~  weight 10/11 = 197# ~  Weight 4/12 = 200# ~  Weight 10/12 = 201# ~  Weight 4/13 = 199# ~  Weight 10/13 = 194# ~  Weight 4/14 = 201#  HIATAL HERNIA WITH REFLUX (ICD-553.3) - supposed to be on PROTONIX 40mg /d, but she notes that she only takes it when she hurts... she continues to treat her reflux as needed w/ the Nexium and OTC Zantac even though we have felt this plays a signif roll in her Asthma... the consultants at [facility] agreed, but were no more effective at getting her to take her meds regularly... 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 ~  meds re-written & encouraged to take PROTONIX 40mg /d- 30 min before the 1st meal, and Zantac 75- 1-2 at bedtime... ~  4/14: she requests switch to NEXIUM40 from the Protonix...  GYN = DrRomaine in the past... pt states that she stopped going because she couldn't help her w/ her hot flashes. ~  4/14: reminded of the importance of regular Gyn checks- pelvic, Pap, Mammograms, etc...  OSTEOARTHRITIS (ICD-715.90) - she had a left knee arthroscopy by DrPaul in 1999...  VITAMIN D DEFICIENCY (ICD-268.9) - pt was on Vit D 50,000 u weekly from DrDoerr... we don't have prev labs from her but pt indicates that the Vit D level was "really really low" so we will refill the Rx & f/u Vit D level later. ~  labs 10/11 showed Vit D level = 44... OK to switch to 1-2000u daily OTC supplement. ~  4/12:  Pt requested to go back on Vit D 50000u weekly... ~  10/12:  Pt indicated that she stopped filling  the Rx for Vit D 50K weekly, ?why? ~  BMD 5/14 was wnl... ~  10/14: she stopped the VitD 50K weekly Rx on her own; Labs 10/14 showed VitD level = 31 & she is requested to take 2000u OTC supplement daily   ANXIETY (ICD-300.00)  Hx of SHINGLES (ICD-053.9) - she presented w/ cryptic left flank pain 12/09 that turned out to be Shingles... treated w/ Valtrex, Pred, Vicodin.  HEALTH MAINTENANCE:   ~  GI:  She has not established w/ a GI for routine screening colonoscopy- reminded again about this important screening procedure! ~  GYN:  GYN = DrRomaine in the past... pt states that she stopped going because she couldn't help her w/ her hot flashes; she is reminded of the need to establish w/ another GYN for PAP, Mammograms, BMD etc... ~  Immuniz:  she refuses Pneumovax & Flu vaccine... She received TDAP 2012 prior to a trip to [place].   Past Surgical History  Procedure Laterality Date  . Left knee arthroscopy  1999    by Dr. Renae Fickle  . Breast reductions  2000    in [place]    Outpatient Encounter Prescriptions as of 07/12/2013  Medication Sig Dispense Refill  . albuterol (VENTOLIN HFA) 108 (90 BASE) MCG/ACT inhaler Inhale 2 puffs into the lungs every 4 (four) hours as needed.  1 Inhaler  11  . aspirin 81 MG tablet Take 81 mg by mouth daily as needed.      . cetirizine (ZYRTEC) 10 MG tablet Take 10 mg by mouth daily as needed.      . Fluticasone-Salmeterol (ADVAIR DISKUS) 500-50 MCG/DOSE AEPB Inhale 1 puff into the lungs every 12 (twelve) hours.  60 each  11  . montelukast (SINGULAIR) 10 MG tablet Take 1 tablet (10 mg total) by mouth at bedtime.  30 tablet  11  . pantoprazole (PROTONIX) 40 MG tablet Take 1 tablet (40 mg total) by mouth daily.  30 tablet  11  . Pseudoephedrine-Ibuprofen (ADVIL COLD & SINUS LIQUI-GELS) 30-200 MG CAPS As needed      . ranitidine (ZANTAC) 75 MG tablet Take 1 tablet (75 mg total) by mouth at bedtime as needed.  30 tablet  11  . simvastatin (ZOCOR) 40 MG tablet Take  1 tablet (40 mg total) by mouth at bedtime.  30 tablet  11  . thyroid (ARMOUR) 60 MG tablet Take 2 tablets by mouth once daily  60 tablet  11  . [DISCONTINUED] esomeprazole (NEXIUM) 40 MG capsule Take 1 capsule (40 mg total) by mouth daily before breakfast.  30 capsule  11   No facility-administered encounter medications on file as of 07/12/2013.    Allergies  Allergen Reactions  . Azithromycin     zpak does not work for the pt    Current Medications, Allergies, Past Medical History, Past Surgical History, Family History, and Social History were reviewed in [employer] record.    Review of Systems       See HPI - all other systems neg except as noted...      The patient complains of dyspnea on exertion.  The patient denies anorexia, fever, weight loss, weight gain, vision loss, decreased hearing, hoarseness, chest pain, syncope, peripheral edema, prolonged cough, headaches, hemoptysis, abdominal pain, melena, hematochezia, severe indigestion/heartburn, hematuria, incontinence, muscle weakness, suspicious skin lesions, transient blindness, difficulty walking, depression,  unusual weight change, abnormal bleeding, enlarged lymph nodes, and angioedema.     Objective:   Physical Exam      WD, overweight, 57 y/o WF in NAD... GENERAL:  Alert & oriented; & cooperative... HEENT:  [place]/AT, EOM-wnl, PERRLA, Fundi-benign, EACs-clear, TMs-wnl, NOSE-clear, THROAT-clear & wnl. NECK:  Supple w/ full ROM; no JVD; normal carotid impulses w/o bruits; no thyromegaly or nodules palpated; no lymphadenopathy. CHEST:  Clear to P & A; decr BS bilat; without wheezes/ rales/ or rhonchi heard... HEART:  Regular Rhythm; without murmurs/ rubs/ or gallops detected... ABDOMEN:  Soft & nontender; normal bowel sounds; no organomegaly or masses palpated... EXT: without deformities, mild arthritic changes; no varicose veins/ venous insuffic/ or edema. NEURO:  CN's intact; no focal neuro  deficits... DERM:  No lesions noted; no rash etc...  RADIOLOGY DATA:  Reviewed in the EPIC EMR & discussed w/ the patient...  LABORATORY DATA:  Reviewed in the EPIC EMR & discussed w/ the patient...   Assessment & Plan:    ASTHMA>  Again asked to use the Advair Bid regularly & gauge control by NOT having to use the rescue inhaler... IgE= 68 w/ pos RAST to Cats, Dust, Dog, some molds & we tried for Xolair treatment but unfortunately this could not be arranged due to her insurance... 4/14> try Singulair=> she wants to continue this rx...  HYPERLIPID>  On Simva40 & asked to take it nightly to gauge it's effectiveness; FLP improved w/ more regular dosing; Needs better diet & wt loss...  HYPOTHYROID>  On Armor Thyroid which she insists upon (prev from DrDoerr); TSH looks good; Continue same...  HH/ REFLUX>  Asked to be diligent w/ antireflux measures including the Protonix40Bid & ZantacQhs if nec...  Vit D Deif>  she stopped the 50K treatment on her own; VitD level 10/14= 31 & asked to take 2000u daily...  OVERWEIGHT>  We reviewed diet + exercise needed to lose wt...  Other medical issues as noted...   Patient's Medications  New Prescriptions   ALBUTEROL (PROVENTIL HFA;VENTOLIN HFA) 108 (90 BASE) MCG/ACT INHALER    Inhale 2 puffs into the lungs every 6 (six) hours as needed for wheezing.   PREDNISONE (STERAPRED UNI-PAK) 5 MG TABS TABLET    Take as directed  Previous Medications   ASPIRIN 81 MG TABLET    Take 81 mg by mouth daily as needed.   CETIRIZINE (ZYRTEC) 10 MG TABLET    Take 10 mg by mouth daily as needed.   PSEUDOEPHEDRINE-IBUPROFEN (ADVIL COLD & SINUS LIQUI-GELS) 30-200 MG CAPS    As needed  Modified Medications   Modified Medication Previous Medication   ADVAIR DISKUS 500-50 MCG/DOSE AEPB Fluticasone-Salmeterol (ADVAIR DISKUS) 500-50 MCG/DOSE AEPB      Inhale one puff by mouth every twelve hours    Inhale 1 puff into the lungs every 12 (twelve) hours.   MONTELUKAST  (SINGULAIR) 10 MG TABLET montelukast (SINGULAIR) 10 MG tablet      Take 1 tablet (10 mg total) by mouth at bedtime.    Take 1 tablet (10 mg total) by mouth at bedtime.   PANTOPRAZOLE (PROTONIX) 40 MG TABLET pantoprazole (PROTONIX) 40 MG tablet      Take 1 tablet (40 mg total) by mouth 2 (two) times daily.    Take 1 tablet (40 mg total) by mouth daily.   RANITIDINE (ZANTAC) 75 MG TABLET ranitidine (ZANTAC) 75 MG tablet      Take 1 tablet (75 mg total) by mouth at bedtime as needed.  Take 1 tablet (75 mg total) by mouth at bedtime as needed.   SIMVASTATIN (ZOCOR) 40 MG TABLET simvastatin (ZOCOR) 40 MG tablet      Take 1 tablet (40 mg total) by mouth at bedtime.    Take 1 tablet (40 mg total) by mouth at bedtime.   THYROID (ARMOUR) 60 MG TABLET thyroid (ARMOUR) 60 MG tablet      Take 2 tablets by mouth once daily    Take 2 tablets by mouth once daily  Discontinued Medications   ALBUTEROL (VENTOLIN HFA) 108 (90 BASE) MCG/ACT INHALER    Inhale 2 puffs into the lungs every 4 (four) hours as needed.   ALBUTEROL (VENTOLIN HFA) 108 (90 BASE) MCG/ACT INHALER    Inhale 2 puffs into the lungs every 4 (four) hours as needed.   ESOMEPRAZOLE (NEXIUM) 40 MG CAPSULE    Take 1 capsule (40 mg total) by mouth daily before breakfast.

## 2013-07-12 NOTE — Patient Instructions (Addendum)
Today we updated your med list in our EPIC system...    Continue your current medications the same...    We refilled your meds per request...  We decided to INCREASE the Protonix to 40mg  TWICE daily taken 30 min or so prior to the 1st 7 last meals of the day...    Remember to keep the head of your bed elevated ~6 inches...    And don't eat or drink much after the dinner meal...  We also gave you a Pred dosepak to try for the allergies & swelling (?from cat exposure)...  Today we did your follow up FASTING blood work...    We will contact you w/ the results when available...   Call for any questions...  Let's plan a follow up visit in 85mo, sooner if needed for problems.Marland KitchenMarland Kitchen

## 2013-07-13 LAB — VITAMIN D 25 HYDROXY (VIT D DEFICIENCY, FRACTURES): Vit D, 25-Hydroxy: 31 ng/mL (ref 30–89)

## 2013-07-15 ENCOUNTER — Other Ambulatory Visit: Payer: Self-pay | Admitting: Pulmonary Disease

## 2013-07-15 MED ORDER — ALBUTEROL SULFATE HFA 108 (90 BASE) MCG/ACT IN AERS: 2.0000 | INHALATION_SPRAY | Freq: Four times a day (QID) | RESPIRATORY_TRACT | Status: DC | PRN

## 2013-07-15 NOTE — Telephone Encounter (Signed)
PA done for the pt to get the ventolin HFA.  This PA was denied and they stated that the preferred alternative is Proair HFA or Proventil HFA.  Called and spoke with pt and she is aware of this and pt stated that she will try the proventil.  This has been sent to her pharmacy and nothing further is needed.

## 2013-08-09 ENCOUNTER — Encounter: Payer: Self-pay | Admitting: Pulmonary Disease

## 2013-08-09 NOTE — Telephone Encounter (Signed)
At last OV the pt was prescribed pred pak for a rash on her face. She states while she was on the prednisone the rash improved, but now it has returned and is worse then before. Pt wants to know should she take more prednisone? Please advise. Amanda [name], CMA Allergies  Allergen Reactions  . Azithromycin     zpak does not work for the pt

## 2013-08-12 ENCOUNTER — Other Ambulatory Visit: Payer: Self-pay

## 2014-01-11 ENCOUNTER — Ambulatory Visit (INDEPENDENT_AMBULATORY_CARE_PROVIDER_SITE_OTHER): Payer: Managed Care, Other (non HMO) | Admitting: Pulmonary Disease

## 2014-01-11 ENCOUNTER — Encounter: Payer: Self-pay | Admitting: Pulmonary Disease

## 2014-01-11 VITALS — BP 138/80 | HR 71 | Temp 98.1°F | Ht 65.0 in | Wt 193.8 lb

## 2014-01-11 DIAGNOSIS — M1990 Unspecified osteoarthritis, unspecified site: Secondary | ICD-10-CM

## 2014-01-11 DIAGNOSIS — K449 Diaphragmatic hernia without obstruction or gangrene: Secondary | ICD-10-CM

## 2014-01-11 DIAGNOSIS — J309 Allergic rhinitis, unspecified: Secondary | ICD-10-CM

## 2014-01-11 DIAGNOSIS — J45909 Unspecified asthma, uncomplicated: Secondary | ICD-10-CM

## 2014-01-11 DIAGNOSIS — E785 Hyperlipidemia, unspecified: Secondary | ICD-10-CM

## 2014-01-11 DIAGNOSIS — E663 Overweight: Secondary | ICD-10-CM

## 2014-01-11 MED ORDER — METHYLPREDNISOLONE ACETATE 80 MG/ML IJ SUSP
80.0000 mg | Freq: Once | INTRAMUSCULAR | Status: AC
  Administered 2014-01-11: 80 mg via INTRAMUSCULAR

## 2014-01-11 MED ORDER — RANITIDINE HCL 75 MG PO TABS: 75.0000 mg | ORAL_TABLET | Freq: Every evening | ORAL | Status: DC | PRN

## 2014-01-11 MED ORDER — SIMVASTATIN 40 MG PO TABS: 40.0000 mg | ORAL_TABLET | Freq: Every day | ORAL | Status: DC

## 2014-01-11 MED ORDER — HYDROCODONE-HOMATROPINE 5-1.5 MG/5ML PO SYRP: 5.0000 mL | ORAL_SOLUTION | Freq: Four times a day (QID) | ORAL | Status: DC | PRN

## 2014-01-11 MED ORDER — PREDNISONE (PAK) 5 MG PO TABS: ORAL_TABLET | ORAL | Status: DC

## 2014-01-11 MED ORDER — PANTOPRAZOLE SODIUM 40 MG PO TBEC
40.0000 mg | DELAYED_RELEASE_TABLET | Freq: Two times a day (BID) | ORAL | Status: DC
Start: 2014-01-11 — End: 2014-02-02

## 2014-01-11 MED ORDER — FLUTICASONE-SALMETEROL 500-50 MCG/DOSE IN AEPB: INHALATION_SPRAY | RESPIRATORY_TRACT | Status: DC

## 2014-01-11 MED ORDER — MONTELUKAST SODIUM 10 MG PO TABS
10.0000 mg | ORAL_TABLET | Freq: Every day | ORAL | Status: DC
Start: 2014-01-11 — End: 2014-07-14

## 2014-01-11 MED ORDER — THYROID 60 MG PO TABS: ORAL_TABLET | ORAL | Status: DC

## 2014-01-11 MED ORDER — ALBUTEROL SULFATE HFA 108 (90 BASE) MCG/ACT IN AERS: 2.0000 | INHALATION_SPRAY | Freq: Four times a day (QID) | RESPIRATORY_TRACT | Status: DC | PRN

## 2014-01-11 NOTE — Progress Notes (Signed)
Subjective:    Patient ID: Amanda [name], female    DOB: 03/11/1956, 58 y.o.   MRN: [id number]  HPI 58 y/o WF here for a follow up visit... she has multiple medical problems as noted below... she was prev followed by DrWelty-Wolfe at [place] for her Asthma, then switched to [facility], but both of them have left... I tried to get her to see Dr. Yates Decamp but pt has refused further referrals for her Asthma... the concern has been for the developement of fixed airway obstruction from her Asthma due to poor control and medication non-compliance in the past...   ~  July 09, 2012:  28mo ROV & she states her Asthma has been acting up for the last several months; she has 3 cats- but all outdoors now; I could not find prev IgE/ RAST testing on her to eval for XOLAIR (?if done at [facility] in the past); she continues on Advair500Bid + AlbutHFA which she proudly notes that she doesn't use that much anymore- "just 4 times daily- not as much as usual"; she denies much cough, sputum, chest discomfort, etc;  She remains on Protonix/ Zantac & antireflux regimen; she freely admits to intermittent dosing of all her meds- "not regularly, more or less";  We discussed Rx w/ Pred course (she refuses Depo shot)...     We reviewed prob list, meds, xrays and labs> see below for updates >> she declines Flu vaccine; requests refill of all meds today...  LABS 10/13:  FLP- not at goals on Simva40 (taking it intermittently);  Chems- wnl;  CBC- wnl;  TSH=5.91 x not taking ArmourThyroid daily...  IgE level = 68 & RAST pos for Cat> Dust> Dog> molds...  We decided to apply for [facility] Rx => unfortunately they could not get payment arranged thru her insurance.  ~  January 07, 2013:  73mo ROV & Amanda [name] has been doing satis by her hx- notes some allergy issues & we discussed adding singulair trial; also requests change from Protonix to Nexium... We reviewed the following medical problems during today's office visit >>     AR,  Asthma> on Zyrtek10, Advair500, ProairHFA; she is currently off Pred & refuses nasal sprays; notes Asthma controlled but worries about the pollen in the spring- add Singulair10 trial; she is an excellent Xolair cand but they couldn't get it worked out w/ her insurance...    Hyperlipid> on Simva40; last FLP 10/13 showed TChol 229, TG 60, HDL 48, LDL 170=> she admits to taking it intermittently...    Hypothyroid> on ArmourThyroid60-2 tabs/d & she refuses Synthroid Rx; TSH 10/13 = 5.91 & reminded to take it every day...    Overweight> wt up to 201# (up 7# in last 51mo) & we reviewed diet, exercise, wt reduction strategies...    HH, Reflux> on Protonix40 + Zantac as needed but requests change to Nexium40; she knows to use max antireflux regimen w/ elev HOB, NPO after dinner, etc...    GYN> prev seen by [facility] but she stopped going when they didn't help her hot flashes; she is again advised to estab w/ another GYN for Pelvic, Pap, Mammography, BMD, etc...     DJD> hx left knee pain & arthroscopy 1999 by DrPaul; she uses OTC analgesics as needed...    VitD defic> intermittently on VitD50K weekly from [place]; stopped on her own in favor of OTC VitD supplement but she stopped that too; last VitD level= 44 in 2011...    Anxiety>  offered alpraz or similar Benzo in past but she declines med rx... We reviewed prob list, meds, xrays and labs> see below for updates >>   Last Labs 10/13> reviewed...   ~  July 12, 2013:  30mo ROV & Amanda [name] is c/o a facial Dermatitis & wants me to prescribe a Pred Dosepak... We reviewed the following medical problems during today's office visit >>     Allergies & Asthma> on Zyrtek, Singulair10, Advair500, AlbutHFA prn; breathing has been good, allergies acting up & she has dermatitis on face- wants Dosepak- ok...    Chol> on Simva40; FLP 10/14 showed TChol 138, TG 78, HDL 42, LDL 81 & rec to continue same...    Overweight> unchanged at 201# w/ BMI= 33-34; we reviewed  diet, exercise, & wt reduction strategies...    GI- HH, Reflux> c/o incr reflux symptoms, offered GI referral but she declines; try incr Protonix40Bid & strict antireflux regimen...     VitD defic> she stopped the VitD 50K weekly Rx on her own; Labs 10/14 showed VitD level = 31 & she is requested to take 2000u OTC supplement daily... We reviewed prob list, meds, xrays and labs> see below for updates >> she declines Flu shot & Pneumovax... Requests refill meds today...  LABS 10/14:  FLP- at goals on Simva40;  Chems- ok x BS=118;  CBC- wnl;  TSH=0.43;  VitD=31 & rec to take 2000u daily...  ~  January 11, 2014:  52mo ROV & Amanda [name] is c/o 3 weeks of misery from the pollen- she notes cough, SOB, wheezing, throat symptoms, and sinus drainage w/ HA; she has been taking some OTC Zyrtek plus her regular meds but hasn't had any Pred in over 14mo;  We decided to treat w/ Depo80, Pred dosepak, & Hycodan for prn use; she refuses nasal sprays...  We reviewed the following medical problems during today's office visit >>     AR, Asthma> on Zyrtek10, Advair500Bid, Singulair10, ProairHFA prn (lately incr to qid); she is an excellent Xolair cand but they couldn't get it worked out w/ her insurance; hasn't used any Pred in >30mo til this visit w/ spring pollen...    Hyperlipid> on Simva40; last FLP 10/14 showed TChol 138, TG 78, HDL 42, LDL 81=> much improved and continue same med + diet...    Hypothyroid> on ArmourThyroid60-2 tabs/d & she refuses Synthroid Rx; TSH 10/14 = 4.43 & she is clinically euthyroid...    Overweight> wt down to 194# & we reviewed diet, exercise, wt reduction strategies...    HH, Reflux> on Protonix40 + Zantac as needed; she knows to use max antireflux regimen w/ elev HOB, NPO after dinner, etc...    GYN> prev seen by [facility] but she stopped going when they didn't help her hot flashes; she is again advised to estab w/ another GYN for Pelvic, Pap, Mammography, BMD, etc...     DJD> hx left knee pain &  arthroscopy 1999 by DrPaul; she uses OTC analgesics as needed...    VitD defic> intermittently on VitD50K weekly from [place]; stopped on her own in favor of OTC VitD supplement but she stopped that too; last VitD level 10/14 = 31 & asked to resume daily vit supplements...    Anxiety> offered Alpraz or similar Benzo in past but she declines med rx... We reviewed prob list, meds, xrays and labs> see below for updates >>           Problem List:  ALLERGIC RHINITIS (ICD-477.9)  ++allergy  testing by DrESL in the 1980's w/ reactions to grasses, ragweed, dust, molds, cats, dog... prev on shots... ~  10/12:  She reports a new kitten at home, but denies allergy or asthma exac so far... ~  10/13:  I cannot find documentation of IgE/ RAST testing (?done at [facility] in the past, she was in a drug trial 2006 at [facility]); we sent labs today> IgE=  68 & RAST pos for Cat> Dust> Dog> molds...  We decided to apply for [facility] Rx => they could not secure payment from her insurance... ~  10/14:  on Zyrtek, Singulair10, Advair500, AlbutHFA prn; breathing has been good, allergies acting up & she has dermatitis on face- wants Dosepak- ok ~  4/15: presents c/o spring allergy symptoms on Singulair10 & Zyrtek OTC, refuses nasal sprays; Rec Depo80, Pred dosepak, etc...  ASTHMA (ICD-493.90) - on ADVAIR 500Bid, & VENTOLIN HFA as needed... she admits to using Advair once dailly & is encouraged to take meds regularly as perscribed; similarly she overuses the rescue inhaler & admonished to not use it more than Qid... ~  baseline CXR shows sl incr markings, NAD... ~  PFT's 5/05 showed FVC= 2.47 (76%), FEV1= 1.51 (56%), %1sec=61, mid-flows= 22%pred... ~  in 2006 she entered the EXTRA trial at [facility] which was a double blind trial testing XOLAIR... ~  last note from [facility] 6/07 DrCarraway w/ concern for her fixed airway obstruction... pt refused f/u in the [place]... ~  f/u PFT 10/11 showed FVC= 2.52 (71%), FEV1= 1.69  (60%), %1sec=67, mid-flows= 38%pred. ~  CXR 10/11 showed clear lungs, NAD... ~  10/13:  Treated for exac w/ Pred course; rechecked labs including IgE= 68 & RAST pos for Cat> Dust> Dog> molds... We decided to apply for [facility] Rx but they could not arrange payment from her insurance... She states that the 1st shot really helped... ~  10/14: on Zyrtek, Singulair10, Advair500, AlbutHFA prn; breathing has been good, allergies acting up & she has dermatitis on face- wants Dosepak- ok. ~  4/15: on Zyrtek10, Advair500Bid, Singulair10, ProairHFA prn (lately incr to qid); she is an excellent Xolair cand but they couldn't get it worked out w/ her insurance; hasn't used any Pred in >78mo til this visit w/ spring pollen.  HYPERLIPIDEMIA (ICD-272.4) - prev followed by DrDoerr, Endocrinology in [place]... prev on Zetia, now on SIMVASTATIN 40mg /d but admits to intermittent dosing. ~  4/11: pt presents w/ request to start writing Simva Rx & monitor labs since DrDoerr has left [place]... encouraged to take med regularly & follow low chol/ low fat diet... ~  FLP 10/11 on Simva40 showed TChol 198, TG 110, HDL 46, LDL 131 ~  FLP 10/12 on Simva40 showed TChol 172, TG 82, HDL 51, LDL 105... Improved, reminded of diet & compliance. ~  FLP 10/13 on Simva40 (not taking daily) showed TChol 229, TG 60, HDL 48, LDL 170==> rec to take med daily & refer to [facility]! ~  FLP 10/14 on Simva40 showed TChol 138, TG 78, HDL 42, LDL 81... Great job, continue daily dosing...  HYPOTHYROIDISM (ICD-244.9) - taking ARMOUR THYROID 60mg - 2tabs daily... she was followed by DrDoerr, Endocrine in [place] for thyroid and her hyperlipidemia... she was prev on Levothyroid and had requested change to Armour Thyroid because she believed the Levothy was making her hair fall out... ~  4/11:  pt reqested that we write her Rx for the Armour Thyroid 60mg  since DrDoerr has left [place]... pt is uncouraged to take the med daily. ~  labs 10/11 showed TSH= 4.69 ~  Labs  10/12 on ArmourThyroid60-2/d showed TSH= 2.88... rec to continue same. ~  Labs 10/13 on ArmourThyroid60-2/d (not taking daily) showed TSH= 5.91==> rec to take med every day!!! ~  Labs 10/14 on ArmourThyroid60-2/d showed TSH=0.43  OVERWEIGHT (ICD-278.02) - we reviewed diet + exercise program required to lose weight... ~  weight 9/10 = 200#,  she is 5\' 5"  tall,  BMI= 34. ~  weight 4/11 = 192#,  this is as good as she's been in several yrs. ~  weight 10/11 = 197# ~  Weight 4/12 = 200# ~  Weight 10/12 = 201# ~  Weight 4/13 = 199# ~  Weight 10/13 = 194# ~  Weight 4/14 = 201# ~  Weight 4/15 = 194#  HIATAL HERNIA WITH REFLUX (ICD-553.3) - supposed to be on PROTONIX 40mg /d, but she notes that she only takes it when she hurts... she continues to treat her reflux as needed w/ the Nexium and OTC Zantac even though we have felt this plays a signif roll in her Asthma... the consultants at [facility] agreed, but were no more effective at getting her to take her meds regularly... 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 ~  meds re-written & encouraged to take PROTONIX 40mg /d- 30 min before the 1st meal, and Zantac 75- 1-2 at bedtime... ~  4/14: she requests switch to NEXIUM40 from the Protonix...  GYN = DrRomaine in the past... pt states that she stopped going because she couldn't help her w/ her hot flashes. ~  4/14: reminded of the importance of regular Gyn checks- pelvic, Pap, Mammograms, etc...  OSTEOARTHRITIS (ICD-715.90) - she had a left knee arthroscopy by DrPaul in 1999...  VITAMIN D DEFICIENCY (ICD-268.9) - pt was on Vit D 50,000 u weekly from DrDoerr... we don't have prev labs from her but pt indicates that the Vit D level was "really really low" so we will refill the Rx & f/u Vit D level later. ~  labs 10/11 showed Vit D level = 44... OK to switch to 1-2000u daily OTC supplement. ~  4/12:  Pt requested to go back on Vit D 50000u  weekly... ~  10/12:  Pt indicated that she stopped filling the Rx for Vit D 50K weekly, ?why? ~  BMD 5/14 was wnl w/ lowest Tscore = -0.2.Marland KitchenMarland Kitchen ~  10/14: she stopped the VitD 50K weekly Rx on her own; Labs 10/14 showed VitD level = 31 & she is requested to take 2000u OTC supplement daily   ANXIETY (ICD-300.00)  Hx of SHINGLES (ICD-053.9) - she presented w/ cryptic left flank pain 12/09 that turned out to be Shingles... treated w/ Valtrex, Pred, Vicodin.  HEALTH MAINTENANCE:   ~  GI:  She has not established w/ a GI for routine screening colonoscopy- reminded again about this important screening procedure! ~  GYN:  GYN = DrRomaine in the past... pt states that she stopped going because she couldn't help her w/ her hot flashes; she is reminded of the need to establish w/ another GYN for PAP, Mammograms, BMD etc... ~  Immuniz:  she refuses Pneumovax & Flu vaccine... She received TDAP 2012 prior to a trip to [place].   Past Surgical History  Procedure Laterality Date  . Left knee arthroscopy  1999    by Dr. Eddie Dibbles  . Breast reductions  2000    in [place]    Outpatient Encounter  Prescriptions as of 01/11/2014  Medication Sig  . ADVAIR DISKUS 500-50 MCG/DOSE AEPB Inhale one puff by mouth every twelve hours  . albuterol (PROVENTIL HFA;VENTOLIN HFA) 108 (90 BASE) MCG/ACT inhaler Inhale 2 puffs into the lungs every 6 (six) hours as needed for wheezing.  Marland Kitchen aspirin 81 MG tablet Take 81 mg by mouth daily as needed.  . cetirizine (ZYRTEC) 10 MG tablet Take 10 mg by mouth daily as needed.  . montelukast (SINGULAIR) 10 MG tablet Take 1 tablet (10 mg total) by mouth at bedtime.  . pantoprazole (PROTONIX) 40 MG tablet Take 1 tablet (40 mg total) by mouth 2 (two) times daily.  . ranitidine (ZANTAC) 75 MG tablet Take 1 tablet (75 mg total) by mouth at bedtime as needed.  . simvastatin (ZOCOR) 40 MG tablet Take 1 tablet (40 mg total) by mouth at bedtime.  Marland Kitchen thyroid (ARMOUR) 60 MG tablet Take 2 tablets by  mouth once daily  . predniSONE (STERAPRED UNI-PAK) 5 MG TABS tablet Take as directed  . Pseudoephedrine-Ibuprofen (ADVIL COLD & SINUS LIQUI-GELS) 30-200 MG CAPS As needed    Allergies  Allergen Reactions  . Azithromycin     zpak does not work for the pt    Current Medications, Allergies, Past Medical History, Past Surgical History, Family History, and Social History were reviewed in [employer] record.    Review of Systems       See HPI - all other systems neg except as noted...      The patient complains of dyspnea on exertion.  The patient denies anorexia, fever, weight loss, weight gain, vision loss, decreased hearing, hoarseness, chest pain, syncope, peripheral edema, prolonged cough, headaches, hemoptysis, abdominal pain, melena, hematochezia, severe indigestion/heartburn, hematuria, incontinence, muscle weakness, suspicious skin lesions, transient blindness, difficulty walking, depression, unusual weight change, abnormal bleeding, enlarged lymph nodes, and angioedema.     Objective:   Physical Exam      WD, overweight, 58 y/o WF in NAD... GENERAL:  Alert & oriented; & cooperative... HEENT:  [place]/AT, EOM-wnl, PERRLA, Fundi-benign, EACs-clear, TMs-wnl, NOSE-clear, THROAT-clear & wnl. NECK:  Supple w/ full ROM; no JVD; normal carotid impulses w/o bruits; no thyromegaly or nodules palpated; no lymphadenopathy. CHEST:  Clear to P & A; decr BS bilat; without wheezes/ rales/ or rhonchi heard... HEART:  Regular Rhythm; without murmurs/ rubs/ or gallops detected... ABDOMEN:  Soft & nontender; normal bowel sounds; no organomegaly or masses palpated... EXT: without deformities, mild arthritic changes; no varicose veins/ venous insuffic/ or edema. NEURO:  CN's intact; no focal neuro deficits... DERM:  No lesions noted; no rash etc...  RADIOLOGY DATA:  Reviewed in the EPIC EMR & discussed w/ the patient...  LABORATORY DATA:  Reviewed in the EPIC EMR & discussed w/  the patient...   Assessment & Plan:    ASTHMA>  Asked to use the Advair Bid regularly & gauge control by NOT having to use the rescue inhaler... IgE= 68 w/ pos RAST to Cats, Dust, Dog, some molds & we tried for Xolair treatment but unfortunately this could not be arranged due to her insurance...  4/14> try Singulair=> she wants to continue this rx... 4/15> springtime exac due to pollen & treated w/ Depo80, dosepak, Hycodan...  HYPERLIPID>  On Simva40 & asked to take it nightly to gauge it's effectiveness; FLP improved w/ more regular dosing; Needs better diet & wt loss...  HYPOTHYROID>  On Armor Thyroid which she insists upon (prev from DrDoerr); TSH looks good; Clinically euthyroid,  Continue same...  HH/ REFLUX>  Asked to be diligent w/ antireflux measures including the Protonix40Bid & ZantacQhs if nec...  Vit D Deif>  she stopped the 50K treatment on her own; VitD level 10/14= 31 & asked to take 2000u daily...  OVERWEIGHT>  We reviewed diet + exercise needed to lose wt...  Other medical issues as noted...   Patient's Medications  New Prescriptions   HYDROCODONE-HOMATROPINE (HYCODAN) 5-1.5 MG/5ML SYRUP    Take 5 mLs by mouth every 6 (six) hours as needed for cough.  Previous Medications   ASPIRIN 81 MG TABLET    Take 81 mg by mouth daily as needed.   CETIRIZINE (ZYRTEC) 10 MG TABLET    Take 10 mg by mouth daily as needed.   PSEUDOEPHEDRINE-IBUPROFEN (ADVIL COLD & SINUS LIQUI-GELS) 30-200 MG CAPS    As needed  Modified Medications   Modified Medication Previous Medication   ALBUTEROL (PROVENTIL HFA;VENTOLIN HFA) 108 (90 BASE) MCG/ACT INHALER albuterol (PROVENTIL HFA;VENTOLIN HFA) 108 (90 BASE) MCG/ACT inhaler      Inhale 2 puffs into the lungs every 6 (six) hours as needed for wheezing.    Inhale 2 puffs into the lungs every 6 (six) hours as needed for wheezing.   FLUTICASONE-SALMETEROL (ADVAIR DISKUS) 500-50 MCG/DOSE AEPB ADVAIR DISKUS 500-50 MCG/DOSE AEPB      Inhale one puff by  mouth every twelve hours    Inhale one puff by mouth every twelve hours   MONTELUKAST (SINGULAIR) 10 MG TABLET montelukast (SINGULAIR) 10 MG tablet      Take 1 tablet (10 mg total) by mouth at bedtime.    Take 1 tablet (10 mg total) by mouth at bedtime.   PANTOPRAZOLE (PROTONIX) 40 MG TABLET pantoprazole (PROTONIX) 40 MG tablet      Take 1 tablet (40 mg total) by mouth 2 (two) times daily.    Take 1 tablet (40 mg total) by mouth 2 (two) times daily.   PREDNISONE (STERAPRED UNI-PAK) 5 MG TABS TABLET predniSONE (STERAPRED UNI-PAK) 5 MG TABS tablet      Take as directed    Take as directed   RANITIDINE (ZANTAC) 75 MG TABLET ranitidine (ZANTAC) 75 MG tablet      Take 1 tablet (75 mg total) by mouth at bedtime as needed.    Take 1 tablet (75 mg total) by mouth at bedtime as needed.   SIMVASTATIN (ZOCOR) 40 MG TABLET simvastatin (ZOCOR) 40 MG tablet      Take 1 tablet (40 mg total) by mouth at bedtime.    Take 1 tablet (40 mg total) by mouth at bedtime.   THYROID (ARMOUR) 60 MG TABLET thyroid (ARMOUR) 60 MG tablet      Take 2 tablets by mouth once daily    Take 2 tablets by mouth once daily  Discontinued Medications   No medications on file

## 2014-01-11 NOTE — Patient Instructions (Signed)
Today we updated your med list in our EPIC system...    Continue your current medications the same...    We refilled your meds per request...  Today we gave you a Depo shot & ne Rx for Pred dosepak & Hycodan cough syrup for your asthma exacerbation...  To minimize the effect of the pollen etc>>    Use an OTC antihist like Zyrtek, claritin, or Allegra once daily in the AM...    Use a SALINE nasal mist every 1-2h during the day to keep the nasal passage clear...    Spray the OTC FLONASE (Fluticasone) 2 sprays in each nostril at bedtime...  Call for any questions...  Let's plan a follow up visit in 36mo, sooner if needed for problems.Marland KitchenMarland Kitchen

## 2014-01-26 ENCOUNTER — Encounter: Payer: Self-pay | Admitting: Pulmonary Disease

## 2014-02-02 MED ORDER — SIMVASTATIN 40 MG PO TABS: 40.0000 mg | ORAL_TABLET | Freq: Every day | ORAL | Status: DC

## 2014-02-02 MED ORDER — PANTOPRAZOLE SODIUM 40 MG PO TBEC: 40.0000 mg | DELAYED_RELEASE_TABLET | Freq: Two times a day (BID) | ORAL | Status: DC

## 2014-02-02 NOTE — Addendum Note (Signed)
Addended by: Parke Poisson E on: 02/02/2014 02:47 PM   Modules accepted: Orders

## 2014-02-02 NOTE — Telephone Encounter (Addendum)
Pt just seen 4.7.15 by SN Protonix and Zocor were printed for pt at her ov, but for 1 month supply Discussed with [name], pt was supposed to call [name] to check the price and per pt's first email to the office she did exactly this and each rx will cost her $20 for 90 day supply.  Pt had asked that the rx's be mailed to her to take to the pharmacy since she has never used them before. Rx's printed for SN to sign and email sent to patient to let her know that we will notify her once the rx's are signed and placed in the mail.  4.29.15 mychart message from pt: that will work for me....just mail them to: [name], [place] [address]  thank you!

## 2014-02-02 NOTE — Addendum Note (Signed)
Addended by: Parke Poisson E on: 02/02/2014 02:51 PM   Modules accepted: Orders

## 2014-07-13 ENCOUNTER — Ambulatory Visit: Payer: Managed Care, Other (non HMO) | Admitting: Pulmonary Disease

## 2014-07-14 ENCOUNTER — Ambulatory Visit (INDEPENDENT_AMBULATORY_CARE_PROVIDER_SITE_OTHER): Payer: 59 | Admitting: Pulmonary Disease

## 2014-07-14 ENCOUNTER — Encounter: Payer: Self-pay | Admitting: Pulmonary Disease

## 2014-07-14 ENCOUNTER — Other Ambulatory Visit (INDEPENDENT_AMBULATORY_CARE_PROVIDER_SITE_OTHER): Payer: 59

## 2014-07-14 VITALS — BP 110/84 | HR 65 | Temp 98.2°F | Ht 65.0 in | Wt 188.6 lb

## 2014-07-14 DIAGNOSIS — K449 Diaphragmatic hernia without obstruction or gangrene: Secondary | ICD-10-CM

## 2014-07-14 DIAGNOSIS — J301 Allergic rhinitis due to pollen: Secondary | ICD-10-CM

## 2014-07-14 DIAGNOSIS — J4530 Mild persistent asthma, uncomplicated: Secondary | ICD-10-CM

## 2014-07-14 DIAGNOSIS — F419 Anxiety disorder, unspecified: Secondary | ICD-10-CM

## 2014-07-14 DIAGNOSIS — E559 Vitamin D deficiency, unspecified: Secondary | ICD-10-CM

## 2014-07-14 LAB — HEPATIC FUNCTION PANEL
ALT: 24 U/L (ref 0–35)
AST: 20 U/L (ref 0–37)
Albumin: 3.7 g/dL (ref 3.5–5.2)
Alkaline Phosphatase: 85 U/L (ref 39–117)
Bilirubin, Direct: 0.1 mg/dL (ref 0.0–0.3)
Total Bilirubin: 0.5 mg/dL (ref 0.2–1.2)
Total Protein: 7 g/dL (ref 6.0–8.3)

## 2014-07-14 LAB — CBC WITH DIFFERENTIAL/PLATELET
Basophils Absolute: 0 10*3/uL (ref 0.0–0.1)
Basophils Relative: 0.7 % (ref 0.0–3.0)
Eosinophils Absolute: 0.5 10*3/uL (ref 0.0–0.7)
Eosinophils Relative: 7.9 % — ABNORMAL HIGH (ref 0.0–5.0)
HCT: 44.1 % (ref 36.0–46.0)
Hemoglobin: 14.8 g/dL (ref 12.0–15.0)
Lymphocytes Relative: 29.1 % (ref 12.0–46.0)
Lymphs Abs: 2 10*3/uL (ref 0.7–4.0)
MCHC: 33.6 g/dL (ref 30.0–36.0)
MCV: 89.6 fl (ref 78.0–100.0)
Monocytes Absolute: 0.5 10*3/uL (ref 0.1–1.0)
Monocytes Relative: 7.4 % (ref 3.0–12.0)
Neutro Abs: 3.7 10*3/uL (ref 1.4–7.7)
Neutrophils Relative %: 54.9 % (ref 43.0–77.0)
Platelets: 271 10*3/uL (ref 150.0–400.0)
RBC: 4.92 Mil/uL (ref 3.87–5.11)
RDW: 13.2 % (ref 11.5–15.5)
WBC: 6.8 10*3/uL (ref 4.0–10.5)

## 2014-07-14 LAB — BASIC METABOLIC PANEL
BUN: 13 mg/dL (ref 6–23)
CO2: 27 mEq/L (ref 19–32)
Calcium: 9 mg/dL (ref 8.4–10.5)
Chloride: 106 mEq/L (ref 96–112)
Creatinine, Ser: 0.8 mg/dL (ref 0.4–1.2)
GFR: 81.93 mL/min (ref >60.00)
Glucose, Bld: 108 mg/dL — ABNORMAL HIGH (ref 70–99)
Potassium: 4 mEq/L (ref 3.5–5.1)
Sodium: 142 mEq/L (ref 135–145)

## 2014-07-14 LAB — LIPID PANEL
Cholesterol: 205 mg/dL — ABNORMAL HIGH (ref 0–200)
HDL: 42 mg/dL (ref >39.00)
LDL Cholesterol: 149 mg/dL — ABNORMAL HIGH (ref 0–99)
NonHDL: 163
Total CHOL/HDL Ratio: 5
Triglycerides: 72 mg/dL (ref 0.0–149.0)
VLDL: 14.4 mg/dL (ref 0.0–40.0)

## 2014-07-14 LAB — TSH: TSH: 6.7 u[IU]/mL — ABNORMAL HIGH (ref 0.35–4.50)

## 2014-07-14 LAB — VITAMIN D 25 HYDROXY (VIT D DEFICIENCY, FRACTURES): VITD: 22.08 ng/mL — ABNORMAL LOW (ref 30.00–100.00)

## 2014-07-14 MED ORDER — RANITIDINE HCL 75 MG PO TABS: 75.0000 mg | ORAL_TABLET | Freq: Every evening | ORAL | Status: DC | PRN

## 2014-07-14 MED ORDER — SIMVASTATIN 40 MG PO TABS: 40.0000 mg | ORAL_TABLET | Freq: Every day | ORAL | Status: DC

## 2014-07-14 MED ORDER — MONTELUKAST SODIUM 10 MG PO TABS: 10.0000 mg | ORAL_TABLET | Freq: Every day | ORAL | Status: DC

## 2014-07-14 MED ORDER — PANTOPRAZOLE SODIUM 40 MG PO TBEC: 40.0000 mg | DELAYED_RELEASE_TABLET | Freq: Two times a day (BID) | ORAL | Status: DC

## 2014-07-14 MED ORDER — ALBUTEROL SULFATE HFA 108 (90 BASE) MCG/ACT IN AERS: 2.0000 | INHALATION_SPRAY | Freq: Four times a day (QID) | RESPIRATORY_TRACT | Status: DC | PRN

## 2014-07-14 MED ORDER — THYROID 60 MG PO TABS: ORAL_TABLET | ORAL | Status: DC

## 2014-07-14 MED ORDER — FLUTICASONE-SALMETEROL 500-50 MCG/DOSE IN AEPB: INHALATION_SPRAY | RESPIRATORY_TRACT | Status: DC

## 2014-07-14 NOTE — Patient Instructions (Signed)
Today we updated your med list in our EPIC system...    Continue your current medications the same...  Today we rechecked your fasting blood work...    We will contact you w/ the results when available...   Try to increase the ADVAIR to one inhalation twice daily...  Continue your other meds the same & stay away from folks w/ upper resp infections, colds, flu, etc...  Call for any questions...  Let's plan a follow up visit in 84mo, sooner if needed for problems.Marland KitchenMarland Kitchen

## 2014-07-14 NOTE — Progress Notes (Signed)
Subjective:    Patient ID: Amanda [name], female    DOB: 1955/10/20, 58 y.o.   MRN: [id number]  HPI 58 y/o WF here for a follow up visit... she has multiple medical problems as noted below... she was prev followed by DrWelty-Wolfe at [place] for her Asthma, then switched to [facility], but both of them have left... I tried to get her to see Dr. Yates Decamp but pt has refused further referrals for her Asthma... the concern has been for the developement of fixed airway obstruction from her Asthma due to poor control and medication non-compliance in the past...   ~  January 07, 2013:  53mo ROV & Taetum has been doing satis by her hx- notes some allergy issues & we discussed adding singulair trial; also requests change from Protonix to Nexium... We reviewed the following medical problems during today's office visit >>     AR, Asthma> on Zyrtek10, Advair500, ProairHFA; she is currently off Pred & refuses nasal sprays; notes Asthma controlled but worries about the pollen in the spring- add Singulair10 trial; she is an excellent Xolair cand but they couldn't get it worked out w/ her insurance...    Hyperlipid> on Simva40; last FLP 10/13 showed TChol 229, TG 60, HDL 48, LDL 170=> she admits to taking it intermittently...    Hypothyroid> on ArmourThyroid60-2 tabs/d & she refuses Synthroid Rx; TSH 10/13 = 5.91 & reminded to take it every day...    Overweight> wt up to 201# (up 7# in last 52mo) & we reviewed diet, exercise, wt reduction strategies...    HH, Reflux> on Protonix40 + Zantac as needed but requests change to Nexium40; she knows to use max antireflux regimen w/ elev HOB, NPO after dinner, etc...    GYN> prev seen by [facility] but she stopped going when they didn't help her hot flashes; she is again advised to estab w/ another GYN for Pelvic, Pap, Mammography, BMD, etc...     DJD> hx left knee pain & arthroscopy 1999 by DrPaul; she uses OTC analgesics as needed...    VitD defic>  intermittently on VitD50K weekly from [place]; stopped on her own in favor of OTC VitD supplement but she stopped that too; last VitD level= 44 in 2011...    Anxiety> offered alpraz or similar Benzo in past but she declines med rx... We reviewed prob list, meds, xrays and labs> see below for updates >>   Last Labs 10/13> reviewed...   ~  July 12, 2013:  44mo ROV & Amanda [name] is c/o a facial Dermatitis & wants me to prescribe a Pred Dosepak... We reviewed the following medical problems during today's office visit >>     Allergies & Asthma> on Zyrtek, Singulair10, Advair500, AlbutHFA prn; breathing has been good, allergies acting up & she has dermatitis on face- wants Dosepak- ok...    Chol> on Simva40; FLP 10/14 showed TChol 138, TG 78, HDL 42, LDL 81 & rec to continue same...    Overweight> unchanged at 201# w/ BMI= 33-34; we reviewed diet, exercise, & wt reduction strategies...    GI- HH, Reflux> c/o incr reflux symptoms, offered GI referral but she declines; try incr Protonix40Bid & strict antireflux regimen...     VitD defic> she stopped the VitD 50K weekly Rx on her own; Labs 10/14 showed VitD level = 31 & she is requested to take 2000u OTC supplement daily... We reviewed prob list, meds, xrays and labs> see below for updates >> she  declines Flu shot & Pneumovax... Requests refill meds today...  LABS 10/14:  FLP- at goals on Simva40;  Chems- ok x BS=118;  CBC- wnl;  TSH=0.43;  VitD=31 & rec to take 2000u daily...  ~  January 11, 2014:  31mo ROV & Amanda [name] is c/o 3 weeks of misery from the pollen- she notes cough, SOB, wheezing, throat symptoms, and sinus drainage w/ HA; she has been taking some OTC Zyrtek plus her regular meds but hasn't had any Pred in over 81mo;  We decided to treat w/ Depo80, Pred dosepak, & Hycodan for prn use; she refuses nasal sprays...  We reviewed the following medical problems during today's office visit >>     AR, Asthma> on Zyrtek10, Advair500Bid, Singulair10,  ProairHFA prn (lately incr to qid); she is an excellent Xolair cand but they couldn't get it worked out w/ her insurance; hasn't used any Pred in >78mo til this visit w/ spring pollen...    Hyperlipid> on Simva40; last FLP 10/14 showed TChol 138, TG 78, HDL 42, LDL 81=> much improved and continue same med + diet...    Hypothyroid> on ArmourThyroid60-2 tabs/d & she refuses Synthroid Rx; TSH 10/14 = 4.43 & she is clinically euthyroid...    Overweight> wt down to 194# & we reviewed diet, exercise, wt reduction strategies...    HH, Reflux> on Protonix40 + Zantac as needed; she knows to use max antireflux regimen w/ elev HOB, NPO after dinner, etc...    GYN> prev seen by [facility] but she stopped going when they didn't help her hot flashes; she is again advised to estab w/ another GYN for Pelvic, Pap, Mammography, BMD, etc...     DJD> hx left knee pain & arthroscopy 1999 by DrPaul; she uses OTC analgesics as needed...    VitD defic> intermittently on VitD50K weekly from [place]; stopped on her own in favor of OTC VitD supplement but she stopped that too; last VitD level 10/14 = 31 & asked to resume daily vit supplements...    Anxiety> offered Alpraz or similar Benzo in past but she declines med rx... We reviewed prob list, meds, xrays and labs> see below for updates >>   ~  July 14, 2014:  23mo ROV & Amanda [name] is c/o DOE eg- walking up her driveway but she is doing NO exercise, very busy as the Dealer at [employer]; we reviewed the need for diet, regular exercise, get wt down;  She notes "a catch" in her back from moving a certain way- rec stretching, heat, etc (she declines musc relaxers)...    Allergies & Asthma treated w/ Advair500Bid (only doing it once per day), Singulair10 Qhs (not regularly), & ProairHFA; uses the rescue inhaler 1-2/wk & asked to take AdvairBid & Singulair everyday to try and minimize the Albuterol...     She remains on Simva40 (?compliance) & Labs  10/15 showed TChol 205, TG 72, HDL 42, LDL 149... Reminded to take it everyday, low fat diet & get the wt down...    Wt is down a few lbs to 187# today & we reviewed diet & exercise needed...    She remains on Protonix & Zantac + antireflux regimen...    She remains on Armour Thyroid 60-2/d (her request) but TSH=6.70 today- she refuses Synthroid, reminded to take her medication every day! We reviewed prob list, meds, xrays and labs> see below for updates >> she refuses Flu vaccine "I'm not putting that in my body" and I explained the  vaccine's mechanism & she said "that's bull"...   LABS 10/15:  FLP- not at goal on Simva40 (?compliance);  Chems- ok w/ BS=108;  CBC- wnl x 8%eos;  TSH=6.70 & reminded to take her ArmourThyroid everyday;  VitD=22 & rec OTC VitD5000u daily...         Problem List:  ALLERGIC RHINITIS (ICD-477.9)  ++allergy testing by DrESL in the 1980's w/ reactions to grasses, ragweed, dust, molds, cats, dog... prev on shots... ~  10/12:  She reports a new kitten at home, but denies allergy or asthma exac so far... ~  10/13:  I cannot find documentation of IgE/ RAST testing (?done at [facility] in the past, she was in a drug trial 2006 at [facility]); we sent labs today> IgE=  68 & RAST pos for Cat> Dust> Dog> molds...  We decided to apply for [facility] Rx => they could not secure payment from her insurance... ~  10/14:  on Zyrtek, Singulair10, Advair500, AlbutHFA prn; breathing has been good, allergies acting up & she has dermatitis on face- wants Dosepak- ok ~  4/15: presents c/o spring allergy symptoms on Singulair10 & Zyrtek OTC, refuses nasal sprays; Rec Depo80, Pred dosepak, etc...  ASTHMA (ICD-493.90) - on ADVAIR 500Bid, & VENTOLIN HFA as needed... she admits to using Advair once dailly & is encouraged to take meds regularly as perscribed; similarly she overuses the rescue inhaler & admonished to not use it more than Qid... ~  baseline CXR shows sl incr markings, NAD... ~  PFT's 5/05 showed  FVC= 2.47 (76%), FEV1= 1.51 (56%), %1sec=61, mid-flows= 22%pred... ~  in 2006 she entered the EXTRA trial at [facility] which was a double blind trial testing XOLAIR... ~  last note from [facility] 6/07 DrCarraway w/ concern for her fixed airway obstruction... pt refused f/u in the [place]... ~  f/u PFT 10/11 showed FVC= 2.52 (71%), FEV1= 1.69 (60%), %1sec=67, mid-flows= 38%pred. ~  CXR 10/11 showed clear lungs, NAD... ~  10/13:  Treated for exac w/ Pred course; rechecked labs including IgE= 68 & RAST pos for Cat> Dust> Dog> molds... We decided to apply for [facility] Rx but they could not arrange payment from her insurance... She states that the 1st shot really helped... ~  10/14: on Zyrtek, Singulair10, Advair500, AlbutHFA prn; breathing has been good, allergies acting up & she has dermatitis on face- wants Dosepak- ok. ~  4/15: on Zyrtek10, Advair500Bid, Singulair10, ProairHFA prn (lately incr to qid); she is an excellent Xolair cand but they couldn't get it worked out w/ her insurance; hasn't used any Pred in >73mo til this visit w/ spring pollen. ~  10/15: on same meds but only doing the Advair once/d & not regular w/ the Singulair; as a result she continues to use the rescue inhaled 1-2/wk she says; we discussed taking meds regularly...  HYPERLIPIDEMIA (ICD-272.4) - prev followed by DrDoerr, Endocrinology in [place]... prev on Zetia, now on SIMVASTATIN 40mg /d but admits to intermittent dosing. ~  4/11: pt presents w/ request to start writing Simva Rx & monitor labs since DrDoerr has left [place]... encouraged to take med regularly & follow low chol/ low fat diet... ~  FLP 10/11 on Simva40 showed TChol 198, TG 110, HDL 46, LDL 131 ~  FLP 10/12 on Simva40 showed TChol 172, TG 82, HDL 51, LDL 105... Improved, reminded of diet & compliance. ~  FLP 10/13 on Simva40 (not taking daily) showed TChol 229, TG 60, HDL 48, LDL 170==> rec to take med daily & refer  to [facility]! ~  FLP 10/14 on Simva40 showed TChol 138, TG  78, HDL 42, LDL 81... Great job, continue daily dosing... ~  [place] 10/15 on Simva40 (?compliance) showed TChol 205, TG 72, HDL 42, LDL 149... Reminded to take it everyday, low fat diet & get the wt down.  HYPOTHYROIDISM (ICD-244.9) - taking ARMOUR THYROID 60mg - 2tabs daily... she was followed by DrDoerr, Endocrine in [place] for thyroid and her hyperlipidemia... she was prev on Levothyroid and had requested change to Armour Thyroid because she believed the Levothy was making her hair fall out... ~  4/11:  pt reqested that we write her Rx for the Armour Thyroid 60mg  since DrDoerr has left [place]... pt is uncouraged to take the med daily. ~  labs 10/11 showed TSH= 4.69 ~  Labs 10/12 on ArmourThyroid60-2/d showed TSH= 2.88... rec to continue same. ~  Labs 10/13 on ArmourThyroid60-2/d (not taking daily) showed TSH= 5.91==> rec to take med every day!!! ~  Labs 10/14 on ArmourThyroid60-2/d showed TSH=0.43 ~  Labs 10/15 on ArmourThyroid60-2/d showed TSH=6.70 and reminded to take all meds every day...  OVERWEIGHT (ICD-278.02) - we reviewed diet + exercise program required to lose weight... ~  weight 9/10 = 200#,  she is 5\' 5"  tall,  BMI= 34. ~  weight 4/11 = 192#,  this is as good as she's been in several yrs. ~  weight 10/11 = 197# ~  Weight 4/12 = 200# ~  Weight 10/12 = 201# ~  Weight 4/13 = 199# ~  Weight 10/13 = 194# ~  Weight 4/14 = 201# ~  Weight 4/15 = 194# ~  Weight 10/15 = 187#  HIATAL HERNIA WITH REFLUX (ICD-553.3) - supposed to be on PROTONIX 40mg /d, but she notes that she only takes it when she hurts... she continues to treat her reflux as needed w/ the Nexium and OTC Zantac even though we have felt this plays a signif roll in her Asthma... the consultants at [facility] agreed, but were no more effective at getting her to take her meds regularly... 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 ~  meds re-written & encouraged to take  PROTONIX 40mg /d- 30 min before the 1st meal, and Zantac 75- 1-2 at bedtime... ~  4/14: she requests switch to NEXIUM40 from the Protonix...  GYN = DrRomaine in the past... pt states that she stopped going because she couldn't help her w/ her hot flashes. ~  4/14: reminded of the importance of regular Gyn checks- pelvic, Pap, Mammograms, etc...  OSTEOARTHRITIS (ICD-715.90) - she had a left knee arthroscopy by DrPaul in 1999...  VITAMIN D DEFICIENCY (ICD-268.9) - pt was on Vit D 50,000 u weekly from DrDoerr... we don't have prev labs from her but pt indicates that the Vit D level was "really really low" so we will refill the Rx & f/u Vit D level later. ~  labs 10/11 showed Vit D level = 44... OK to switch to 1-2000u daily OTC supplement. ~  4/12:  Pt requested to go back on Vit D 50000u weekly... ~  10/12:  Pt indicated that she stopped filling the Rx for Vit D 50K weekly, ?why? ~  BMD 5/14 was wnl w/ lowest Tscore = -0.2.Marland KitchenMarland Kitchen ~  10/14: she stopped the VitD 50K weekly Rx on her own; Labs 10/14 showed VitD level = 31 & she is requested to take 2000u OTC supplement daily  ~  Labs 10/15 showed  VitD level = 22 and she is rec to take OTC VitD supplement ~5000u daily...  ANXIETY (ICD-300.00)  Hx of SHINGLES (ICD-053.9) - she presented w/ cryptic left flank pain 12/09 that turned out to be Shingles... treated w/ Valtrex, Pred, Vicodin.  HEALTH MAINTENANCE:   ~  GI:  She has not established w/ a GI for routine screening colonoscopy- reminded again about this important screening procedure! ~  GYN:  GYN = DrRomaine in the past... pt states that she stopped going because she couldn't help her w/ her hot flashes; she is reminded of the need to establish w/ another GYN for PAP, Mammograms, BMD etc... ~  Immuniz:  she refuses Pneumovax & Flu vaccine... She received TDAP 2012 prior to a trip to [place].   Past Surgical History  Procedure Laterality Date  . Left knee arthroscopy  1999    by Dr. Eddie Dibbles  .  Breast reductions  2000    in [place]    Outpatient Encounter Prescriptions as of 07/14/2014  Medication Sig  . albuterol (PROVENTIL HFA;VENTOLIN HFA) 108 (90 BASE) MCG/ACT inhaler Inhale 2 puffs into the lungs every 6 (six) hours as needed for wheezing.  Marland Kitchen aspirin 81 MG tablet Take 81 mg by mouth daily as needed.  . cetirizine (ZYRTEC) 10 MG tablet Take 10 mg by mouth daily as needed.  . Fluticasone-Salmeterol (ADVAIR DISKUS) 500-50 MCG/DOSE AEPB Inhale one puff by mouth every twelve hours  . HYDROcodone-homatropine (HYCODAN) 5-1.5 MG/5ML syrup Take 5 mLs by mouth every 6 (six) hours as needed for cough.  . montelukast (SINGULAIR) 10 MG tablet Take 1 tablet (10 mg total) by mouth at bedtime.  . pantoprazole (PROTONIX) 40 MG tablet Take 1 tablet (40 mg total) by mouth 2 (two) times daily.  . predniSONE (STERAPRED UNI-PAK) 5 MG TABS tablet Take as directed  . Pseudoephedrine-Ibuprofen (ADVIL COLD & SINUS LIQUI-GELS) 30-200 MG CAPS As needed  . ranitidine (ZANTAC) 75 MG tablet Take 1 tablet (75 mg total) by mouth at bedtime as needed.  . simvastatin (ZOCOR) 40 MG tablet Take 1 tablet (40 mg total) by mouth at bedtime.  Marland Kitchen thyroid (ARMOUR) 60 MG tablet Take 2 tablets by mouth once daily    Allergies  Allergen Reactions  . Azithromycin     zpak does not work for the pt    Current Medications, Allergies, Past Medical History, Past Surgical History, Family History, and Social History were reviewed in [employer] record.    Review of Systems       See HPI - all other systems neg except as noted...      The patient complains of dyspnea on exertion.  The patient denies anorexia, fever, weight loss, weight gain, vision loss, decreased hearing, hoarseness, chest pain, syncope, peripheral edema, prolonged cough, headaches, hemoptysis, abdominal pain, melena, hematochezia, severe indigestion/heartburn, hematuria, incontinence, muscle weakness, suspicious skin lesions,  transient blindness, difficulty walking, depression, unusual weight change, abnormal bleeding, enlarged lymph nodes, and angioedema.     Objective:   Physical Exam      WD, overweight, 59 y/o WF in NAD... GENERAL:  Alert & oriented; & cooperative... HEENT:  [place]/AT, EOM-wnl, PERRLA, Fundi-benign, EACs-clear, TMs-wnl, NOSE-clear, THROAT-clear & wnl. NECK:  Supple w/ full ROM; no JVD; normal carotid impulses w/o bruits; no thyromegaly or nodules palpated; no lymphadenopathy. CHEST:  Clear to P & A; decr BS bilat; without wheezes/ rales/ or rhonchi heard... HEART:  Regular Rhythm; without murmurs/ rubs/ or gallops detected... ABDOMEN:  Soft & nontender; normal bowel sounds; no organomegaly or masses palpated... EXT: without deformities, mild arthritic changes; no varicose veins/ venous insuffic/ or edema. NEURO:  CN's intact; no focal neuro deficits... DERM:  No lesions noted; no rash etc...  RADIOLOGY DATA:  Reviewed in the EPIC EMR & discussed w/ the patient...  LABORATORY DATA:  Reviewed in the EPIC EMR & discussed w/ the patient...   Assessment & Plan:    ASTHMA>  Asked to use the Advair Bid regularly & gauge control by NOT having to use the rescue inhaler... IgE= 68 w/ pos RAST to Cats, Dust, Dog, some molds & we tried for Xolair treatment but unfortunately this could not be arranged due to her insurance...  4/14> try Singulair=> she wants to continue this rx... 4/15> springtime exac due to pollen & treated w/ Depo80, dosepak, Hycodan... 10/15> reminded to take all regular meds REGULARLY!  HYPERLIPID>  On Simva40 & asked to take it nightly to gauge it's effectiveness; FLP improved w/ more regular dosing; Needs better diet & wt loss...  HYPOTHYROID>  On Armor Thyroid which she insists upon (prev from DrDoerr); TSH looks good when she takes it everyday; Clinically euthyroid, Continue same...  HH/ REFLUX>  Asked to be diligent w/ antireflux measures including the Protonix40Bid &  ZantacQhs if nec...  Vit D Deif>  she stopped the 50K treatment on her own; VitD level 10/15= 22 & asked to take 5000u daily OTC supplement...  OVERWEIGHT>  We reviewed diet + exercise needed to lose wt...  Other medical issues as noted...   Patient's Medications  New Prescriptions   No medications on file  Previous Medications   ASPIRIN 81 MG TABLET    Take 81 mg by mouth daily as needed.   CETIRIZINE (ZYRTEC) 10 MG TABLET    Take 10 mg by mouth daily as needed.   HYDROCODONE-HOMATROPINE (HYCODAN) 5-1.5 MG/5ML SYRUP    Take 5 mLs by mouth every 6 (six) hours as needed for cough.   PREDNISONE (STERAPRED UNI-PAK) 5 MG TABS TABLET    Take as directed   PSEUDOEPHEDRINE-IBUPROFEN (ADVIL COLD & SINUS LIQUI-GELS) 30-200 MG CAPS    As needed  Modified Medications   Modified Medication Previous Medication   ALBUTEROL (PROVENTIL HFA;VENTOLIN HFA) 108 (90 BASE) MCG/ACT INHALER albuterol (PROVENTIL HFA;VENTOLIN HFA) 108 (90 BASE) MCG/ACT inhaler      Inhale 2 puffs into the lungs every 6 (six) hours as needed for wheezing.    Inhale 2 puffs into the lungs every 6 (six) hours as needed for wheezing.   FLUTICASONE-SALMETEROL (ADVAIR DISKUS) 500-50 MCG/DOSE AEPB Fluticasone-Salmeterol (ADVAIR DISKUS) 500-50 MCG/DOSE AEPB      Inhale one puff by mouth every twelve hours    Inhale one puff by mouth every twelve hours   MONTELUKAST (SINGULAIR) 10 MG TABLET montelukast (SINGULAIR) 10 MG tablet      Take 1 tablet (10 mg total) by mouth at bedtime.    Take 1 tablet (10 mg total) by mouth at bedtime.   PANTOPRAZOLE (PROTONIX) 40 MG TABLET pantoprazole (PROTONIX) 40 MG tablet      Take 1 tablet (40 mg total) by mouth 2 (two) times daily.    Take 1 tablet (40 mg total) by mouth 2 (two) times daily.   RANITIDINE (ZANTAC) 75 MG TABLET ranitidine (ZANTAC) 75 MG tablet      Take 1 tablet (75 mg total) by mouth at bedtime as needed.    Take 1 tablet (75 mg total) by mouth  at bedtime as needed.   SIMVASTATIN  (ZOCOR) 40 MG TABLET simvastatin (ZOCOR) 40 MG tablet      Take 1 tablet (40 mg total) by mouth at bedtime.    Take 1 tablet (40 mg total) by mouth at bedtime.   THYROID (ARMOUR) 60 MG TABLET thyroid (ARMOUR) 60 MG tablet      Take 2 tablets by mouth once daily    Take 2 tablets by mouth once daily  Discontinued Medications   No medications on file

## 2014-07-22 ENCOUNTER — Other Ambulatory Visit: Payer: Self-pay

## 2014-07-25 ENCOUNTER — Telehealth: Payer: Self-pay | Admitting: Pulmonary Disease

## 2014-07-25 NOTE — Telephone Encounter (Signed)
Called and spoke with pt and she is aware of lab results per SN.  Nothing further is needed. 

## 2015-01-13 ENCOUNTER — Ambulatory Visit (INDEPENDENT_AMBULATORY_CARE_PROVIDER_SITE_OTHER): Payer: 59 | Admitting: Pulmonary Disease

## 2015-01-13 ENCOUNTER — Encounter: Payer: Self-pay | Admitting: Pulmonary Disease

## 2015-01-13 VITALS — BP 116/70 | HR 80 | Temp 98.0°F | Ht 67.0 in | Wt 186.0 lb

## 2015-01-13 DIAGNOSIS — J301 Allergic rhinitis due to pollen: Secondary | ICD-10-CM | POA: Diagnosis not present

## 2015-01-13 DIAGNOSIS — M545 Low back pain, unspecified: Secondary | ICD-10-CM

## 2015-01-13 DIAGNOSIS — M150 Primary generalized (osteo)arthritis: Secondary | ICD-10-CM | POA: Diagnosis not present

## 2015-01-13 DIAGNOSIS — M8949 Other hypertrophic osteoarthropathy, multiple sites: Secondary | ICD-10-CM

## 2015-01-13 DIAGNOSIS — J4530 Mild persistent asthma, uncomplicated: Secondary | ICD-10-CM | POA: Diagnosis not present

## 2015-01-13 DIAGNOSIS — E039 Hypothyroidism, unspecified: Secondary | ICD-10-CM

## 2015-01-13 DIAGNOSIS — E559 Vitamin D deficiency, unspecified: Secondary | ICD-10-CM

## 2015-01-13 DIAGNOSIS — F411 Generalized anxiety disorder: Secondary | ICD-10-CM

## 2015-01-13 DIAGNOSIS — M159 Polyosteoarthritis, unspecified: Secondary | ICD-10-CM

## 2015-01-13 MED ORDER — TIOTROPIUM BROMIDE MONOHYDRATE 2.5 MCG/ACT IN AERS: 2.0000 | INHALATION_SPRAY | Freq: Every day | RESPIRATORY_TRACT | Status: DC

## 2015-01-13 MED ORDER — ALBUTEROL SULFATE HFA 108 (90 BASE) MCG/ACT IN AERS: 2.0000 | INHALATION_SPRAY | Freq: Four times a day (QID) | RESPIRATORY_TRACT | Status: DC | PRN

## 2015-01-13 MED ORDER — MONTELUKAST SODIUM 10 MG PO TABS: 10.0000 mg | ORAL_TABLET | Freq: Every day | ORAL | Status: DC

## 2015-01-13 MED ORDER — PREDNISONE (PAK) 5 MG PO TABS: ORAL_TABLET | ORAL | Status: DC

## 2015-01-13 MED ORDER — SIMVASTATIN 40 MG PO TABS: 40.0000 mg | ORAL_TABLET | Freq: Every day | ORAL | Status: DC

## 2015-01-13 MED ORDER — PANTOPRAZOLE SODIUM 40 MG PO TBEC: 40.0000 mg | DELAYED_RELEASE_TABLET | Freq: Two times a day (BID) | ORAL | Status: DC

## 2015-01-13 NOTE — Progress Notes (Signed)
Subjective:    Patient ID: Amanda [name], female    DOB: [birth year], 59 y.o.   MRN: [id number]  HPI 59 y/o WF here for a follow up visit... she has multiple medical problems as noted below... she was prev followed by DrWelty-Wolfe at [facility] for her Asthma, then switched to [facility], but both of them have left... I tried to get her to see Dr. Yates Decamp but pt has refused further referrals for her Asthma... the concern has been for the developement of fixed airway obstruction from her Asthma due to poor control and medication non-compliance in the past...  ~  SEE PREV EPIC NOTES FOR OLDER DATA >>   ~  July 12, 2013:  24mo ROV & Amanda [name] is c/o a facial Dermatitis & wants me to prescribe a Pred Dosepak... We reviewed the following medical problems during today's office visit >>     Allergies & Asthma> on Zyrtek, Singulair10, Advair500, AlbutHFA prn; breathing has been good, allergies acting up & she has dermatitis on face- wants Dosepak- ok...    Chol> on Simva40; FLP 10/14 showed TChol 138, TG 78, HDL 42, LDL 81 & rec to continue same...    Overweight> unchanged at 201# w/ BMI= 33-34; we reviewed diet, exercise, & wt reduction strategies...    GI- HH, Reflux> c/o incr reflux symptoms, offered GI referral but she declines; try incr Protonix40Bid & strict antireflux regimen...     VitD defic> she stopped the VitD 50K weekly Rx on her own; Labs 10/14 showed VitD level = 31 & she is requested to take 2000u OTC supplement daily... We reviewed prob list, meds, xrays and labs> see below for updates >> she declines Flu shot & Pneumovax... Requests refill meds today...  LABS 10/14:  FLP- at goals on Simva40;  Chems- ok x BS=118;  CBC- wnl;  TSH=0.43;  VitD=31 & rec to take 2000u daily...  ~  January 11, 2014:  58mo ROV & Amanda [name] is c/o 3 weeks of misery from the pollen- she notes cough, SOB, wheezing, throat symptoms, and sinus drainage w/ HA; she has been taking some OTC Zyrtek plus her regular  meds but hasn't had any Pred in over 24mo;  We decided to treat w/ Depo80, Pred dosepak, & Hycodan for prn use; she refuses nasal sprays...  We reviewed the following medical problems during today's office visit >>     AR, Asthma> on Zyrtek10, Advair500Bid, Singulair10, ProairHFA prn (lately incr to qid); she is an excellent Xolair cand but they couldn't get it worked out w/ her insurance; hasn't used any Pred in >60mo til this visit w/ spring pollen...    Hyperlipid> on Simva40; last FLP 10/14 showed TChol 138, TG 78, HDL 42, LDL 81=> much improved and continue same med + diet...    Hypothyroid> on ArmourThyroid60-2 tabs/d & she refuses Synthroid Rx; TSH 10/14 = 4.43 & she is clinically euthyroid...    Overweight> wt down to 194# & we reviewed diet, exercise, wt reduction strategies...    HH, Reflux> on Protonix40 + Zantac as needed; she knows to use max antireflux regimen w/ elev HOB, NPO after dinner, etc...    GYN> prev seen by [facility] but she stopped going when they didn't help her hot flashes; she is again advised to estab w/ another GYN for Pelvic, Pap, Mammography, BMD, etc...     DJD> hx left knee pain & arthroscopy 1999 by DrPaul; she uses OTC analgesics as needed...    VitD  defic> intermittently on VitD50K weekly from [place]; stopped on her own in favor of OTC VitD supplement but she stopped that too; last VitD level 10/14 = 31 & asked to resume daily vit supplements...    Anxiety> offered Alpraz or similar Benzo in past but she declines med rx... We reviewed prob list, meds, xrays and labs> see below for updates >>   ~  July 14, 2014:  58mo ROV & Amanda [name] is c/o DOE eg- walking up her driveway but she is doing NO exercise, very busy as the Dealer at [employer]; we reviewed the need for diet, regular exercise, get wt down;  She notes "a catch" in her back from moving a certain way- rec stretching, heat, etc (she declines musc relaxers)...    Allergies &  Asthma treated w/ Advair500Bid (only doing it once per day), Singulair10 Qhs (not regularly), & ProairHFA; uses the rescue inhaler 1-2/wk & asked to take AdvairBid & Singulair everyday to try and minimize the Albuterol...     She remains on Simva40 (?compliance) & Labs 10/15 showed TChol 205, TG 72, HDL 42, LDL 149... Reminded to take it everyday, low fat diet & get the wt down...    Wt is down a few lbs to 187# today & we reviewed diet & exercise needed...    She remains on Protonix & Zantac + antireflux regimen...    She remains on Armour Thyroid 60-2/d (her request) but TSH=6.70 today- she refuses Synthroid, reminded to take her medication every day! We reviewed prob list, meds, xrays and labs> see below for updates >> she refuses Flu vaccine "I'm not putting that in my body" and I explained the vaccine's mechanism & she said "that's bull"...   LABS 10/15:  FLP- not at goal on Simva40 (?compliance);  Chems- ok w/ BS=108;  CBC- wnl x 8%eos;  TSH=6.70 & reminded to take her ArmourThyroid everyday;  VitD=22 & rec OTC VitD5000u daily...  ~  January 13, 2015:  58mo ROV & Amanda [name] reports breathing at baseline but seasonal allergies are kicking in; she also c/o some LBP, thinks it's her bed, offered XRays vs Ortho but she declines, advised heat/ back brace/ Tylenol etc... We reviewed the following medical problems during today's office visit >>     AR, Asthma> on Advair500Bid, ProairHFA prn, & Zyrtek10 (she stopped the Singulair on her own); she is an excellent Xolair cand but they couldn't get it worked out w/ her insurance (copay too $$); hasn't used any Pred in ?38yr...    Hyperlipid> on Simva40; last FLP 10/15 showed TChol 205, TG 72, HDL 42, LDL 149=> not as good as prev & I suspect compliance issues- asked to take it every eve, + better diet/ exerc/ wt reduction...    Hypothyroid> on ArmourThyroid60-2 tabs/d & she refuses Synthroid Rx; TSH 10/15 = 6.70 & she is clinically euthyroid, again reminded of the  need for 100% compliance w/ dosing...    Overweight> wt down to 186# & we reviewed diet, exercise, wt reduction strategies...    HH, Reflux> on Protonix40Bid + Zantac75 as needed; she knows to use max antireflux regimen w/ elev HOB, NPO after dinner, etc...    GYN> prev seen by [facility] but she stopped going when they didn't help her hot flashes; she is again advised to estab w/ another GYN for Pelvic, Pap, Mammography, BMD, etc (she still has not complied w/ this request)...     DJD> hx left knee pain &  arthroscopy 1999 by DrPaul; she uses OTC analgesics as needed...    VitD defic> intermittently on VitD50K weekly from [place]; stopped on her own- took OTC VitD supplement but she stopped that too; last VitD level 10/14 = 31 & asked to resume daily vit supplements (never did- asked again)...    Anxiety> offered Alpraz or similar Benzo in past but she declines med rx... 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 PLAN>>  We discussed adding Spiriva Respimat- 2sp daily due to her Spirometry report; continue REGULAR DOSING of all meds including Advair500Bid; she refuses to take Mucinex, refuses nasal sprays, refuses to sched w/ Gyn...         Problem List:  ALLERGIC RHINITIS (ICD-477.9)  ++allergy testing by DrESL in the 1980's w/ reactions to grasses, ragweed, dust, molds, cats, dog... prev on shots... ~  10/12:  She reports a new kitten at home, but denies allergy or asthma exac so far... ~  10/13:  I cannot find documentation of IgE/ RAST testing (?done at [facility] in the past, she was in a drug trial 2006 at [facility]); we sent labs today> IgE=  68 & RAST pos for Cat> Dust> Dog> molds...  We decided to apply for [facility] Rx => they could not secure payment from her insurance... ~   10/14:  on Zyrtek, Singulair10, Advair500, AlbutHFA prn; breathing has been good, allergies acting up & she has dermatitis on face- wants Dosepak- ok ~  4/15: presents c/o spring allergy symptoms on Singulair10 & Zyrtek OTC, refuses nasal sprays; Rec Depo80, Pred dosepak, etc... ~  4/16: this spring hasn't been so bad, yet; she stopped the Singulair on her own, won't use any nasal sprays; advised to call if round of Pred needed...  ASTHMA (ICD-493.90) - on ADVAIR 500Bid, & VENTOLIN HFA as needed... she admits to using Advair once dailly & is encouraged to take meds regularly as perscribed; similarly she overuses the rescue inhaler & admonished to not use it more than Qid... ~  baseline CXR shows sl incr markings, NAD... ~  PFT's 5/05 showed FVC= 2.47 (76%), FEV1= 1.51 (56%), %1sec=61, mid-flows= 22%pred... ~  in 2006 she entered the EXTRA trial at [facility] which was a double blind trial testing XOLAIR... ~  last note from [facility] 6/07 DrCarraway w/ concern for her fixed airway obstruction... pt refused f/u in the [place]... ~  f/u PFT 10/11 showed FVC= 2.52 (71%), FEV1= 1.69 (60%), %1sec=67, mid-flows= 38%pred. ~  CXR 10/11 showed clear lungs, NAD... ~  10/13:  Treated for exac w/ Pred course; rechecked labs including IgE= 68 & RAST pos for Cat> Dust> Dog> molds... We decided to apply for [facility] Rx but they could not arrange payment from her insurance... She states that the 1st shot really helped... ~  10/14: on Zyrtek, Singulair10, Advair500, AlbutHFA prn; breathing has been good, allergies acting up & she has dermatitis on face- wants Dosepak- ok. ~  4/15: on Zyrtek10, Advair500Bid, Singulair10, ProairHFA prn (lately incr to qid); she is an excellent Xolair cand but they couldn't get it worked out w/ her insurance; hasn't used any Pred in >22mo til this visit w/ spring pollen. ~  10/15: on same meds but only doing the Advair once/d & not regular w/ the Singulair; as a  result she continues to use the  rescue inhaled 1-2/wk she says; we discussed taking meds regularly... 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 prev on Zetia, now on SIMVASTATIN 40mg /d but admits to intermittent dosing. ~  4/11: pt presents w/ request to start writing Simva Rx & monitor labs since DrDoerr has left [place]... encouraged to take med regularly & follow low chol/ low fat diet... ~  FLP 10/11 on Simva40 showed TChol 198, TG 110, HDL 46, LDL 131 ~  FLP 10/12 on Simva40 showed TChol 172, TG 82, HDL 51, LDL 105... Improved, reminded of diet & compliance. ~  FLP 10/13 on Simva40 (not taking daily) showed TChol 229, TG 60, HDL 48, LDL 170==> rec to take med daily & refer to [facility]! ~  FLP 10/14 on Simva40 showed TChol 138, TG 78, HDL 42, LDL 81... Great job, continue daily dosing... ~  [place] 10/15 on Simva40 (?compliance) showed TChol 205, TG 72, HDL 42, LDL 149... Reminded to take it everyday, low fat diet & get the wt down. ~  4/16: reminded to take med every eve + diet/ exercise/ wt reduction...  HYPOTHYROIDISM (ICD-244.9) - taking ARMOUR THYROID 60mg - 2tabs daily... she was followed by DrDoerr, Endocrine in [place] for thyroid and her hyperlipidemia... she was prev on Levothyroid and had requested change to Armour Thyroid because she believed the Levothy was making her hair fall out... ~  4/11:  pt reqested that we write her Rx for the Armour Thyroid 60mg  since DrDoerr has left [place]... pt is uncouraged to take the med daily. ~  labs 10/11 showed TSH= 4.69 ~  Labs 10/12 on  ArmourThyroid60-2/d showed TSH= 2.88... rec to continue same. ~  Labs 10/13 on ArmourThyroid60-2/d (not taking daily) showed TSH= 5.91==> rec to take med every day!!! ~  Labs 10/14 on ArmourThyroid60-2/d showed TSH=0.43 ~  Labs 10/15 on ArmourThyroid60-2/d showed TSH=6.70 and reminded to take all meds every day...  OVERWEIGHT (ICD-278.02) - we reviewed diet + exercise program required to lose weight... ~  weight 9/10 = 200#,  she is 5\' 5"  tall,  BMI= 34. ~  weight 4/11 = 192#,  this is as good as she's been in several yrs. 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 she continues to treat her reflux as needed w/ the Nexium and OTC Zantac even though we have felt this plays a signif roll in her Asthma... the consultants at [facility] agreed, but were no more effective at getting her to take her meds regularly... 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 ~  meds re-written & encouraged  to take PROTONIX 40mg /d- 30 min before the 1st meal, and Zantac 75- 1-2 at bedtime... ~  4/14: she requests switch to NEXIUM40 from the Protonix...  GYN = DrRomaine in the past... pt states that she stopped going because she couldn't help her w/ her hot flashes. ~  4/14: reminded of the importance of regular Gyn checks- pelvic, Pap, Mammograms, etc...  OSTEOARTHRITIS (ICD-715.90) - she had a left knee arthroscopy by DrPaul in 1999...  VITAMIN D DEFICIENCY (ICD-268.9) - pt was on Vit D 50,000 u weekly from DrDoerr... we don't have prev labs from her but pt indicates that the Vit D level was "really really low" so we will refill the Rx & f/u Vit D level later. ~   labs 10/11 showed Vit D level = 44... OK to switch to 1-2000u daily OTC supplement. ~  4/12:  Pt requested to go back on Vit D 50000u weekly... ~  10/12:  Pt indicated that she stopped filling the Rx for Vit D 50K weekly, ?why? ~  BMD 5/14 was wnl w/ lowest Tscore = -0.2.Marland KitchenMarland Kitchen ~  10/14: she stopped the VitD 50K weekly Rx on her own; Labs 10/14 showed VitD level = 31 & she is requested to take 2000u OTC supplement daily  ~  Labs 10/15 showed VitD level = 22 and she is rec to take OTC VitD supplement ~5000u daily... ~  Pt reminded to take all meds regularly + OTC VitD supplements...  ANXIETY (ICD-300.00)  Hx of SHINGLES (ICD-053.9) - she presented w/ cryptic left flank pain 12/09 that turned out to be Shingles... treated w/ Valtrex, Pred, Vicodin.  HEALTH MAINTENANCE:   ~  GI:  She has not established w/ a GI for routine screening colonoscopy- reminded again about this important screening procedure! ~  GYN:  GYN = DrRomaine in the past... pt states that she stopped going because she couldn't help her w/ her hot flashes; she is reminded of the need to establish w/ another GYN for PAP, Mammograms, BMD etc... ~  Immuniz:  she refuses Pneumovax & Flu vaccine... She received TDAP 2012 prior to a trip to [place].   Past Surgical History  Procedure Laterality Date  . Left knee arthroscopy  1999    by Dr. Eddie Dibbles  . Breast reductions  2000    in [place]    Outpatient Encounter Prescriptions as of 01/13/2015  Medication Sig  . albuterol (PROVENTIL HFA;VENTOLIN HFA) 108 (90 BASE) MCG/ACT inhaler Inhale 2 puffs into the lungs every 6 (six) hours as needed for wheezing.  Marland Kitchen aspirin 81 MG tablet Take 81 mg by mouth daily as needed.  . cetirizine (ZYRTEC) 10 MG tablet Take 10 mg by mouth daily as needed.  . Fluticasone-Salmeterol (ADVAIR DISKUS) 500-50 MCG/DOSE AEPB Inhale one puff by mouth every twelve hours  . HYDROcodone-homatropine (HYCODAN) 5-1.5 MG/5ML syrup Take 5 mLs by mouth every 6 (six) hours as  needed for cough.  . montelukast (SINGULAIR) 10 MG tablet Take 1 tablet (10 mg total) by mouth at bedtime.  . pantoprazole (PROTONIX) 40 MG tablet Take 1 tablet (40 mg total) by mouth 2 (two) times daily.  . predniSONE (STERAPRED UNI-PAK) 5 MG TABS tablet Take as directed  . Pseudoephedrine-Ibuprofen (ADVIL COLD & SINUS LIQUI-GELS) 30-200 MG CAPS As needed  . ranitidine (ZANTAC) 75 MG tablet Take 1 tablet (75 mg total) by mouth at bedtime as needed.  . simvastatin (ZOCOR) 40 MG tablet Take 1 tablet (40 mg total) by mouth at  bedtime.  Marland Kitchen thyroid (ARMOUR) 60 MG tablet Take 2 tablets by mouth once daily    Allergies  Allergen Reactions  . Azithromycin     zpak does not work for the pt    Current Medications, Allergies, Past Medical History, Past Surgical History, Family History, and Social History were reviewed in [employer] record.    Review of Systems       See HPI - all other systems neg except as noted...      The patient complains of dyspnea on exertion.  The patient denies anorexia, fever, weight loss, weight gain, vision loss, decreased hearing, hoarseness, chest pain, syncope, peripheral edema, prolonged cough, headaches, hemoptysis, abdominal pain, melena, hematochezia, severe indigestion/heartburn, hematuria, incontinence, muscle weakness, suspicious skin lesions, transient blindness, difficulty walking, depression, unusual weight change, abnormal bleeding, enlarged lymph nodes, and angioedema.     Objective:   Physical Exam      WD, overweight, 59 y/o WF in NAD... GENERAL:  Alert & oriented; & cooperative... HEENT:  [place]/AT, EOM-wnl, PERRLA, Fundi-benign, EACs-clear, TMs-wnl, NOSE-clear, THROAT-clear & wnl. NECK:  Supple w/ full ROM; no JVD; normal carotid impulses w/o bruits; no thyromegaly or nodules palpated; no lymphadenopathy. CHEST:  Clear to P & A; decr BS bilat; without wheezes/ rales/ or rhonchi heard... HEART:  Regular Rhythm; without murmurs/  rubs/ or gallops detected... ABDOMEN:  Soft & nontender; normal bowel sounds; no organomegaly or masses palpated... EXT: without deformities, mild arthritic changes; no varicose veins/ venous insuffic/ or edema. NEURO:  CN's intact; no focal neuro deficits... DERM:  No lesions noted; no rash etc...  RADIOLOGY DATA:  Reviewed in the EPIC EMR & discussed w/ the patient...  LABORATORY DATA:  Reviewed in the EPIC EMR & discussed w/ the patient...   Assessment & Plan:    ASTHMA>  Asked to use the Advair Bid regularly & gauge control by NOT having to use the rescue inhaler... IgE= 68 w/ pos RAST to Cats, Dust, Dog, some molds & we tried for Xolair treatment but unfortunately this could not be arranged due to her insurance...  4/14> try Singulair=> she wants to continue this rx... 4/15> springtime exac due to pollen & treated w/ Depo80, dosepak, Hycodan... 10/15> reminded to take all regular meds REGULARLY! 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  HYPERLIPID>  On Simva40 & asked to take it nightly to gauge it's effectiveness; FLP improved w/ more regular dosing; Needs better diet & wt loss...  HYPOTHYROID>  On Armor Thyroid which she insists upon (prev from DrDoerr); TSH looks good when she takes it everyday; Clinically euthyroid, Continue same...  HH/ REFLUX>  Asked to be diligent w/ antireflux measures including the Protonix40Bid & ZantacQhs if nec...  Vit D Deif>  she stopped the 50K treatment on her own; VitD level 10/15= 22 & asked to take 5000u daily OTC supplement...  OVERWEIGHT>  We reviewed diet + exercise needed to lose wt...  Other medical issues as noted...   Patient's Medications  New Prescriptions   TIOTROPIUM BROMIDE MONOHYDRATE (SPIRIVA RESPIMAT) 2.5 MCG/ACT AERS    Inhale 2 puffs into the lungs daily.  Previous Medications   ASPIRIN 81 MG  TABLET    Take 81 mg by mouth daily as needed.   CETIRIZINE (ZYRTEC) 10 MG TABLET    Take 10 mg by mouth daily as needed.  FLUTICASONE-SALMETEROL (ADVAIR DISKUS) 500-50 MCG/DOSE AEPB    Inhale one puff by mouth every twelve hours   HYDROCODONE-HOMATROPINE (HYCODAN) 5-1.5 MG/5ML SYRUP    Take 5 mLs by mouth every 6 (six) hours as needed for cough.   PSEUDOEPHEDRINE-IBUPROFEN (ADVIL COLD & SINUS LIQUI-GELS) 30-200 MG CAPS    As needed   RANITIDINE (ZANTAC) 75 MG TABLET    Take 1 tablet (75 mg total) by mouth at bedtime as needed.   THYROID (ARMOUR) 60 MG TABLET    Take 2 tablets by mouth once daily  Modified Medications   Modified Medication Previous Medication   ALBUTEROL (PROVENTIL HFA;VENTOLIN HFA) 108 (90 BASE) MCG/ACT INHALER albuterol (PROVENTIL HFA;VENTOLIN HFA) 108 (90 BASE) MCG/ACT inhaler      Inhale 2 puffs into the lungs every 6 (six) hours as needed for wheezing.    Inhale 2 puffs into the lungs every 6 (six) hours as needed for wheezing.   MONTELUKAST (SINGULAIR) 10 MG TABLET montelukast (SINGULAIR) 10 MG tablet      Take 1 tablet (10 mg total) by mouth at bedtime.    Take 1 tablet (10 mg total) by mouth at bedtime.   PANTOPRAZOLE (PROTONIX) 40 MG TABLET pantoprazole (PROTONIX) 40 MG tablet      Take 1 tablet (40 mg total) by mouth 2 (two) times daily.    Take 1 tablet (40 mg total) by mouth 2 (two) times daily.   PREDNISONE (STERAPRED UNI-PAK) 5 MG TABS TABLET predniSONE (STERAPRED UNI-PAK) 5 MG TABS tablet      Take as directed    Take as directed   SIMVASTATIN (ZOCOR) 40 MG TABLET simvastatin (ZOCOR) 40 MG tablet      Take 1 tablet (40 mg total) by mouth at bedtime.    Take 1 tablet (40 mg total) by mouth at bedtime.  Discontinued Medications   No medications on file

## 2015-01-13 NOTE — Patient Instructions (Signed)
Today we updated your med list in our EPIC system...    Continue your current medications the same...  Reminder to use the ADVAIR twice daily every day...    Add OTC MUCINEX 600mg  1-2 tabs twice daily for any congestion...  Today we rechecked your pulmonary function>    We decided to add the new SPIRIVA Respimat - inhale 2 sprays once daily...  .keep up the good work w/ diet & exercise...    The goal is to lose 10-15 lbs...  You need to establish w/ a new GYN- have a follow up PAP smear, Mammogram, etc...    Don't forget to take a OTC VIT D supplement ~approx 2000u daily...  Call for any questions...  Let's plan a follow up visit in 13mo, sooner if needed for problems.Marland KitchenMarland Kitchen

## 2015-01-31 ENCOUNTER — Encounter: Payer: Self-pay | Admitting: Internal Medicine

## 2015-07-18 ENCOUNTER — Telehealth: Payer: Self-pay | Admitting: Pulmonary Disease

## 2015-07-18 ENCOUNTER — Other Ambulatory Visit (INDEPENDENT_AMBULATORY_CARE_PROVIDER_SITE_OTHER): Payer: 59

## 2015-07-18 ENCOUNTER — Ambulatory Visit (INDEPENDENT_AMBULATORY_CARE_PROVIDER_SITE_OTHER): Payer: 59 | Admitting: Pulmonary Disease

## 2015-07-18 ENCOUNTER — Encounter: Payer: Self-pay | Admitting: Pulmonary Disease

## 2015-07-18 VITALS — BP 138/78 | HR 58 | Temp 98.5°F | Wt 188.2 lb

## 2015-07-18 DIAGNOSIS — E039 Hypothyroidism, unspecified: Secondary | ICD-10-CM

## 2015-07-18 DIAGNOSIS — M8949 Other hypertrophic osteoarthropathy, multiple sites: Secondary | ICD-10-CM

## 2015-07-18 DIAGNOSIS — E663 Overweight: Secondary | ICD-10-CM

## 2015-07-18 DIAGNOSIS — F411 Generalized anxiety disorder: Secondary | ICD-10-CM

## 2015-07-18 DIAGNOSIS — J3089 Other allergic rhinitis: Secondary | ICD-10-CM

## 2015-07-18 DIAGNOSIS — M150 Primary generalized (osteo)arthritis: Secondary | ICD-10-CM

## 2015-07-18 DIAGNOSIS — M159 Polyosteoarthritis, unspecified: Secondary | ICD-10-CM

## 2015-07-18 DIAGNOSIS — J4541 Moderate persistent asthma with (acute) exacerbation: Secondary | ICD-10-CM | POA: Diagnosis not present

## 2015-07-18 DIAGNOSIS — E785 Hyperlipidemia, unspecified: Secondary | ICD-10-CM

## 2015-07-18 LAB — HEPATIC FUNCTION PANEL
ALT: 15 U/L (ref 0–35)
AST: 15 U/L (ref 0–37)
Albumin: 4.3 g/dL (ref 3.5–5.2)
Alkaline Phosphatase: 95 U/L (ref 39–117)
Bilirubin, Direct: 0.1 mg/dL (ref 0.0–0.3)
Total Bilirubin: 0.5 mg/dL (ref 0.2–1.2)
Total Protein: 6.9 g/dL (ref 6.0–8.3)

## 2015-07-18 LAB — LIPID PANEL
Cholesterol: 219 mg/dL — ABNORMAL HIGH (ref 0–200)
HDL: 51 mg/dL (ref >39.00)
LDL Cholesterol: 151 mg/dL — ABNORMAL HIGH (ref 0–99)
NonHDL: 168.18
Total CHOL/HDL Ratio: 4
Triglycerides: 86 mg/dL (ref 0.0–149.0)
VLDL: 17.2 mg/dL (ref 0.0–40.0)

## 2015-07-18 LAB — CBC WITH DIFFERENTIAL/PLATELET
Basophils Absolute: 0.1 10*3/uL (ref 0.0–0.1)
Basophils Relative: 0.8 % (ref 0.0–3.0)
Eosinophils Absolute: 0.6 10*3/uL (ref 0.0–0.7)
Eosinophils Relative: 6.9 % — ABNORMAL HIGH (ref 0.0–5.0)
HCT: 43.7 % (ref 36.0–46.0)
Hemoglobin: 14.8 g/dL (ref 12.0–15.0)
Lymphocytes Relative: 23.8 % (ref 12.0–46.0)
Lymphs Abs: 2.1 10*3/uL (ref 0.7–4.0)
MCHC: 34 g/dL (ref 30.0–36.0)
MCV: 90.3 fl (ref 78.0–100.0)
Monocytes Absolute: 0.6 10*3/uL (ref 0.1–1.0)
Monocytes Relative: 6.2 % (ref 3.0–12.0)
Neutro Abs: 5.5 10*3/uL (ref 1.4–7.7)
Neutrophils Relative %: 62.3 % (ref 43.0–77.0)
Platelets: 281 10*3/uL (ref 150.0–400.0)
RBC: 4.84 Mil/uL (ref 3.87–5.11)
RDW: 13.2 % (ref 11.5–15.5)
WBC: 8.9 10*3/uL (ref 4.0–10.5)

## 2015-07-18 LAB — BASIC METABOLIC PANEL
BUN: 9 mg/dL (ref 6–23)
CO2: 29 mEq/L (ref 19–32)
Calcium: 9.5 mg/dL (ref 8.4–10.5)
Chloride: 104 mEq/L (ref 96–112)
Creatinine, Ser: 0.8 mg/dL (ref 0.40–1.20)
GFR: 78.12 mL/min (ref >60.00)
Glucose, Bld: 112 mg/dL — ABNORMAL HIGH (ref 70–99)
Potassium: 4.6 mEq/L (ref 3.5–5.1)
Sodium: 143 mEq/L (ref 135–145)

## 2015-07-18 LAB — TSH: TSH: 10.51 u[IU]/mL — ABNORMAL HIGH (ref 0.35–4.50)

## 2015-07-18 LAB — VITAMIN D 25 HYDROXY (VIT D DEFICIENCY, FRACTURES): VITD: 16.63 ng/mL — ABNORMAL LOW (ref 30.00–100.00)

## 2015-07-18 MED ORDER — THYROID 60 MG PO TABS: ORAL_TABLET | ORAL | Status: DC

## 2015-07-18 MED ORDER — MONTELUKAST SODIUM 10 MG PO TABS: 10.0000 mg | ORAL_TABLET | Freq: Every day | ORAL | Status: DC

## 2015-07-18 MED ORDER — PANTOPRAZOLE SODIUM 40 MG PO TBEC: 40.0000 mg | DELAYED_RELEASE_TABLET | Freq: Two times a day (BID) | ORAL | Status: DC

## 2015-07-18 MED ORDER — UMECLIDINIUM BROMIDE 62.5 MCG/INH IN AEPB: 1.0000 | INHALATION_SPRAY | Freq: Every day | RESPIRATORY_TRACT | Status: DC

## 2015-07-18 MED ORDER — SIMVASTATIN 40 MG PO TABS: 40.0000 mg | ORAL_TABLET | Freq: Every day | ORAL | Status: DC

## 2015-07-18 MED ORDER — PREDNISONE 20 MG PO TABS: ORAL_TABLET | ORAL | Status: DC

## 2015-07-18 NOTE — Progress Notes (Signed)
Subjective:    Patient ID: Amanda [name], female    DOB: 1956-10-03, 59 y.o.   MRN: [id number]  HPI 59 y/o WF here for a follow up visit... she has multiple medical problems as noted below... she was prev followed by DrWelty-Wolfe at [facility] for her Asthma, then switched to [facility], but both of them have left... I tried to get her to see Dr. Yates Decamp but pt has refused further referrals for her Asthma... the concern has been for the developement of fixed airway obstruction from her Asthma due to poor control and medication non-compliance in the past...  ~  SEE PREV EPIC NOTES FOR OLDER DATA >>    LABS 10/14:  FLP- at goals on Simva40;  Chems- ok x BS=118;  CBC- wnl;  TSH=0.43;  VitD=31 & rec to take 2000u daily...  ~  July 14, 2014:  68mo ROV & [name] is c/o DOE eg- walking up her driveway but she is doing NO exercise, very busy as the Dealer at [employer]; we reviewed the need for diet, regular exercise, get wt down;  She notes "a catch" in her back from moving a certain way- rec stretching, heat, etc (she declines musc relaxers)...    Allergies & Asthma treated w/ Advair500Bid (only doing it once per day), Singulair10 Qhs (not regularly), & ProairHFA; uses the rescue inhaler 1-2/wk & asked to take AdvairBid & Singulair everyday to try and minimize the Albuterol...     She remains on Simva40 (?compliance) & Labs 10/15 showed TChol 205, TG 72, HDL 42, LDL 149... Reminded to take it everyday, low fat diet & get the wt down...    Wt is down a few lbs to 187# today & we reviewed diet & exercise needed...    She remains on Protonix & Zantac + antireflux regimen...    She remains on Armour Thyroid 60-2/d (her request) but TSH=6.70 today- she refuses Synthroid, reminded to take her medication every day! We reviewed prob list, meds, xrays and labs> see below for updates >> she refuses Flu vaccine "I'm not putting that in my body" and I explained the vaccine's  mechanism & she said "that's bull"...   LABS 10/15:  FLP- not at goal on Simva40 (?compliance);  Chems- ok w/ BS=108;  CBC- wnl x 8%eos;  TSH=6.70 & reminded to take her ArmourThyroid everyday;  VitD=22 & rec OTC VitD5000u daily...  ~  January 13, 2015:  11mo ROV & [name] reports breathing at baseline but seasonal allergies are kicking in; she also c/o some LBP, thinks it's her bed, offered XRays vs Ortho but she declines, advised heat/ back brace/ Tylenol etc... We reviewed the following medical problems during today's office visit >>     AR, Asthma> on Advair500Bid, ProairHFA prn, & Zyrtek10 (she stopped the Singulair on her own); she is an excellent Xolair cand but they couldn't get it worked out w/ her insurance (copay too $$); hasn't used any Pred in ?48yr...    Her medical management has been repeatedly hampered over the years by medication non-compliance...     Hyperlipid> on Simva40; last FLP 10/15 showed TChol 205, TG 72, HDL 42, LDL 149=> not as good as prev & I suspect compliance issues- asked to take it every eve, + better diet/ exerc/ wt reduction...    Hypothyroid> on ArmourThyroid60-2 tabs/d & she refuses Synthroid Rx; TSH 10/15 = 6.70 & she is clinically euthyroid, again reminded of the need for 100% compliance w/  dosing...    Overweight> wt down to 186# & we reviewed diet, exercise, wt reduction strategies...    HH, Reflux> on Protonix40Bid + Zantac75 as needed; she knows to use max antireflux regimen w/ elev HOB, NPO after dinner, etc...    GYN> prev seen by [facility] but she stopped going when they didn't help her hot flashes; she is again advised to estab w/ another GYN for Pelvic, Pap, Mammography, BMD, etc (she still has not complied w/ this request)...     DJD> hx left knee pain & arthroscopy 1999 by DrPaul; she uses OTC analgesics as needed...    VitD defic> intermittently on VitD50K weekly from [place]; stopped on her own- took OTC VitD supplement but she stopped that  too; last VitD level 10/14 = 31 & asked to resume daily vit supplements (never did- asked again)...    Anxiety> offered Alpraz or similar Benzo in past but she declines med rx... 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 PLAN>>  We discussed adding Spiriva Respimat- 2sp daily due to her Spirometry report; continue REGULAR DOSING of all meds including Advair500Bid; she refuses to take Mucinex, refuses nasal sprays, refuses to sched w/ Gyn...  ~  July 18, 2015:  10mo ROV & [name] presents w/ recent incr SOB, cough, wheezing & chest tightness which she thinks is related to "weather changes and allergies";  Notes baseline DOE w/ "a bunch of stuff" like housework, taking out the trash, etc; we have discussed need for diet, exercise, wt reduction on mult occais but she has been unable to comply...    Asthma/ AB> on Advair500Bid, VentolinHFA prn (uses thisQid recently); she hasn't had Pred in a long time & stopped Spiriva & Singulair on her own; I have rec that she add INCRUSE one inhalation daily & Pred20mg - tapering schedule...    HL> supposed to be on Simva40; [place] 10/16 shows TChol 219, TG 86, HDL 51, LDL 151 and a call to her Pharm indicates no recent refills of this med- we discussed taking the stain med every day, regularly, preferrably Qhs...    Hypothyroid> supposed to be on her Armour Thyroid 60-2tabs daily; LABS 07/18/15 showed TSH=10.51 and she is reminded to take it every day so we can determine her optimal dose...    Overweight>  Weight today is 188# up 2# & we reviewed diet/ exercise/ wt reduction...    Vit D defic>  Supposed to be on OTC Vit D supplement daily but she declines to take it regularly; LABS 07/2015 shows VitD level = 17 and asked to restart VitD 50K weekly dosing...   EXAM shows Afeb, VSS, O2sat=96% on RA;  HEENT- neg, mallampati2;  Chest- mild exp wheezing, scat rhonchi, no rales or consolidation;  Heart- RR w/o m/r/g;  Abd- soft, nontender, neg;  Ext- w/o c/c/e;  Neuro- intact...  LABS 10/17/14>  FLP- not at goals but she hasn't filled the Simva40 in several months;  Chems- wnl x BS=112;  CBC- wnl;  TSH=10.51;  VitD=16.6.Marland KitchenMarland Kitchen  IMP/PLAN>>  Ogechi has an AB exac & we will treat w/ Pred tapering sched but she needs to take her regular meds daily as maintenance, we discussed adding INCRUSE to her Advair;  Her LABS also reflect poor compliance w/ medications and she PROMISES to  do better...         Problem List:  ALLERGIC RHINITIS (ICD-477.9)  ++allergy testing by DrESL in the 1980's w/ reactions to grasses, ragweed, dust, molds, cats, dog... prev on shots... ~  10/12:  She reports a new kitten at home, but denies allergy or asthma exac so far... ~  10/13:  I cannot find documentation of IgE/ RAST testing (?done at [facility] in the past, she was in a drug trial 2006 at [facility]); we sent labs today> IgE=  68 & RAST pos for Cat> Dust> Dog> molds...  We decided to apply for [facility] Rx => they could not secure payment from her insurance... ~  10/14:  on Zyrtek, Singulair10, Advair500, AlbutHFA prn; breathing has been good, allergies acting up & she has dermatitis on face- wants Dosepak- ok ~  4/15: presents c/o spring allergy symptoms on Singulair10 & Zyrtek OTC, refuses nasal sprays; Rec Depo80, Pred dosepak, etc... ~  4/16: this spring hasn't been so bad, yet; she stopped the Singulair on her own, won't use any nasal sprays; advised to call if round of Pred needed...  ASTHMA (ICD-493.90) - on ADVAIR 500Bid, & VENTOLIN HFA as needed... she admits to using Advair once dailly & is encouraged to take meds regularly as perscribed; similarly she overuses the rescue inhaler & admonished to not use it more than Qid... ~  baseline CXR shows sl incr markings, NAD... ~  PFT's 5/05 showed  FVC= 2.47 (76%), FEV1= 1.51 (56%), %1sec=61, mid-flows= 22%pred... ~  in 2006 she entered the EXTRA trial at [facility] which was a double blind trial testing XOLAIR... ~  last note from [facility] 6/07 DrCarraway w/ concern for her fixed airway obstruction... pt refused f/u in the [place]... ~  f/u PFT 10/11 showed FVC= 2.52 (71%), FEV1= 1.69 (60%), %1sec=67, mid-flows= 38%pred. ~  CXR 10/11 showed clear lungs, NAD... ~  10/13:  Treated for exac w/ Pred course; rechecked labs including IgE= 68 & RAST pos for Cat> Dust> Dog> molds... We decided to apply for [facility] Rx but they could not arrange payment from her insurance... She states that the 1st shot really helped... ~  10/14: on Zyrtek, Singulair10, Advair500, AlbutHFA prn; breathing has been good, allergies acting up & she has dermatitis on face- wants Dosepak- ok. ~  4/15: on Zyrtek10, Advair500Bid, Singulair10, ProairHFA prn (lately incr to qid); she is an excellent Xolair cand but they couldn't get it worked out w/ her insurance; hasn't used any Pred in >69mo til this visit w/ spring pollen. ~  10/15: on same meds but only doing the Advair once/d & not regular w/ the Singulair; as a result she continues to use the rescue inhaled 1-2/wk she says; we discussed taking meds regularly... 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 prev on Zetia, now on SIMVASTATIN 40mg /d but admits to intermittent dosing. ~  4/11: pt presents w/ request to start writing Simva Rx & monitor labs since DrDoerr has left [place]... encouraged to  take med regularly & follow low chol/ low fat diet... ~  FLP  10/11 on Simva40 showed TChol 198, TG 110, HDL 46, LDL 131 ~  FLP 10/12 on Simva40 showed TChol 172, TG 82, HDL 51, LDL 105... Improved, reminded of diet & compliance. ~  FLP 10/13 on Simva40 (not taking daily) showed TChol 229, TG 60, HDL 48, LDL 170==> rec to take med daily & refer to [facility]! ~  FLP 10/14 on Simva40 showed TChol 138, TG 78, HDL 42, LDL 81... Great job, continue daily dosing... ~  [place] 10/15 on Simva40 (?compliance) showed TChol 205, TG 72, HDL 42, LDL 149... Reminded to take it everyday, low fat diet & get the wt down. ~  4/16: reminded to take med every eve + diet/ exercise/ wt reduction...  HYPOTHYROIDISM (ICD-244.9) - taking ARMOUR THYROID 60mg - 2tabs daily... she was followed by DrDoerr, Endocrine in [place] for thyroid and her hyperlipidemia... she was prev on Levothyroid and had requested change to Armour Thyroid because she believed the Levothy was making her hair fall out... ~  4/11:  pt reqested that we write her Rx for the Armour Thyroid 60mg  since DrDoerr has left [place]... pt is uncouraged to take the med daily. ~  labs 10/11 showed TSH= 4.69 ~  Labs 10/12 on ArmourThyroid60-2/d showed TSH= 2.88... rec to continue same. ~  Labs 10/13 on ArmourThyroid60-2/d (not taking daily) showed TSH= 5.91==> rec to take med every day!!! ~  Labs 10/14 on ArmourThyroid60-2/d showed TSH=0.43 ~  Labs 10/15 on ArmourThyroid60-2/d showed TSH=6.70 and reminded to take all meds every day...  OVERWEIGHT (ICD-278.02) - we reviewed diet + exercise program required to lose weight... ~  weight 9/10 = 200#,  she is 5\' 5"  tall,  BMI= 34. ~  weight 4/11 = 192#,  this is as good as she's been in several yrs. ~  weight 10/11 = 197# ~  Weight 4/12 = 200# ~  Weight 10/12 = 201# ~  Weight 4/13 = 199# ~  Weight 10/13 = 194# ~  Weight 4/14 = 201# ~  Weight 4/15 = 194# ~  Weight 10/15 = 187# ~  Weight 4/16 = 186#  HIATAL HERNIA WITH  REFLUX (ICD-553.3) - supposed to be on PROTONIX 40mg /d, but she notes that she only takes it when she hurts... she continues to treat her reflux as needed w/ the Nexium and OTC Zantac even though we have felt this plays a signif roll in her Asthma... the consultants at [facility] agreed, but were no more effective at getting her to take her meds regularly... 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 ~  meds re-written & encouraged to take PROTONIX 40mg /d- 30 min before the 1st meal, and Zantac 75- 1-2 at bedtime... ~  4/14: she requests switch to NEXIUM40 from the Protonix...  GYN = DrRomaine in the past... pt states that she stopped going because she couldn't help her w/ her hot flashes. ~  4/14: reminded of the importance of regular Gyn checks- pelvic, Pap, Mammograms, etc...  OSTEOARTHRITIS (ICD-715.90) - she had a left knee arthroscopy by DrPaul in 1999...  VITAMIN D DEFICIENCY (ICD-268.9) - pt was on Vit D 50,000 u weekly from DrDoerr... we don't have prev labs from her but pt indicates that the Vit D level was "really really low" so we will refill the Rx & f/u Vit D level later. ~  labs 10/11 showed Vit D level = 44... OK to switch to 1-2000u daily OTC supplement. ~  4/12:  Pt requested to go back on Vit D 50000u weekly... ~  10/12:  Pt indicated that she stopped filling the Rx for Vit D 50K weekly, ?why? ~  BMD 5/14 was wnl w/ lowest Tscore = -0.2.Marland KitchenMarland Kitchen ~  10/14: she stopped the VitD 50K weekly Rx on her own; Labs 10/14 showed VitD level = 31 & she is requested to take 2000u OTC supplement daily  ~  Labs 10/15 showed VitD level = 22 and she is rec to take OTC VitD supplement ~5000u daily... ~  Pt reminded to take all meds regularly + OTC VitD supplements...  ANXIETY (ICD-300.00)  Hx of SHINGLES (ICD-053.9) - she presented w/ cryptic left flank pain 12/09 that turned out to be Shingles... treated w/ Valtrex, Pred, Vicodin.  HEALTH  MAINTENANCE:   ~  GI:  She has not established w/ a GI for routine screening colonoscopy- reminded again about this important screening procedure! ~  GYN:  GYN = DrRomaine in the past... pt states that she stopped going because she couldn't help her w/ her hot flashes; she is reminded of the need to establish w/ another GYN for PAP, Mammograms, BMD etc... ~  Immuniz:  she refuses Pneumovax & Flu vaccine... She received TDAP 2012 prior to a trip to [place].   Past Surgical History  Procedure Laterality Date  . Left knee arthroscopy  1999    by Dr. Eddie Dibbles  . Breast reductions  2000    in [place]    Outpatient Encounter Prescriptions as of 07/18/2015  Medication Sig  . albuterol (PROVENTIL HFA;VENTOLIN HFA) 108 (90 BASE) MCG/ACT inhaler Inhale 2 puffs into the lungs every 6 (six) hours as needed for wheezing.  Marland Kitchen aspirin 81 MG tablet Take 81 mg by mouth daily as needed.  . cetirizine (ZYRTEC) 10 MG tablet Take 10 mg by mouth daily as needed.  . Fluticasone-Salmeterol (ADVAIR DISKUS) 500-50 MCG/DOSE AEPB Inhale one puff by mouth every twelve hours  . HYDROcodone-homatropine (HYCODAN) 5-1.5 MG/5ML syrup Take 5 mLs by mouth every 6 (six) hours as needed for cough.  . montelukast (SINGULAIR) 10 MG tablet Take 1 tablet (10 mg total) by mouth at bedtime.  . pantoprazole (PROTONIX) 40 MG tablet Take 1 tablet (40 mg total) by mouth 2 (two) times daily.  . Pseudoephedrine-Ibuprofen (ADVIL COLD & SINUS LIQUI-GELS) 30-200 MG CAPS As needed  . ranitidine (ZANTAC) 75 MG tablet Take 1 tablet (75 mg total) by mouth at bedtime as needed.  . simvastatin (ZOCOR) 40 MG tablet Take 1 tablet (40 mg total) by mouth at bedtime.  Marland Kitchen thyroid (ARMOUR) 60 MG tablet Take 2 tablets by mouth once daily  . Tiotropium Bromide Monohydrate (SPIRIVA RESPIMAT) 2.5 MCG/ACT AERS Inhale 2 puffs into the lungs daily. (Patient not taking: Reported on 07/18/2015)    Allergies  Allergen Reactions  . Azithromycin     zpak does not  work for the pt    Current Medications, Allergies, Past Medical History, Past Surgical History, Family History, and Social History were reviewed in [employer] record.    Review of Systems       See HPI - all other systems neg except as noted...      The patient complains of dyspnea on exertion.  The patient denies anorexia, fever, weight loss, weight gain, vision loss, decreased hearing, hoarseness, chest pain, syncope, peripheral edema, prolonged cough, headaches, hemoptysis, abdominal pain, melena, hematochezia, severe indigestion/heartburn, hematuria, incontinence, muscle weakness, suspicious skin lesions, transient blindness,  difficulty walking, depression, unusual weight change, abnormal bleeding, enlarged lymph nodes, and angioedema.     Objective:   Physical Exam      WD, overweight, 59 y/o WF in NAD... GENERAL:  Alert & oriented; & cooperative... HEENT:  [place]/AT, EOM-wnl, PERRLA, Fundi-benign, EACs-clear, TMs-wnl, NOSE-clear, THROAT-clear & wnl. NECK:  Supple w/ full ROM; no JVD; normal carotid impulses w/o bruits; no thyromegaly or nodules palpated; no lymphadenopathy. CHEST:  Clear to P & A; decr BS bilat; without wheezes/ rales/ or rhonchi heard... HEART:  Regular Rhythm; without murmurs/ rubs/ or gallops detected... ABDOMEN:  Soft & nontender; normal bowel sounds; no organomegaly or masses palpated... EXT: without deformities, mild arthritic changes; no varicose veins/ venous insuffic/ or edema. NEURO:  CN's intact; no focal neuro deficits... DERM:  No lesions noted; no rash etc...  RADIOLOGY DATA:  Reviewed in the EPIC EMR & discussed w/ the patient...  LABORATORY DATA:  Reviewed in the EPIC EMR & discussed w/ the patient...   Assessment & Plan:    ASTHMA>  Asked to use the Advair Bid regularly & gauge control by NOT having to use the rescue inhaler... IgE= 68 w/ pos RAST to Cats, Dust, Dog, some molds & we tried for Xolair treatment but  unfortunately this could not be arranged due to her insurance...  4/14> try Singulair=> she wants to continue this rx... 4/15> springtime exac due to pollen & treated w/ Depo80, dosepak, Hycodan... 10/15> reminded to take all regular meds REGULARLY! 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 10/16> she presents w/ an exac, only taking Advair Bid & VentolinHFA prn, she stopped the Spiriva & ?if using the Advair every day; we reviewed the need for daily meds- try Advair500Bid & Incruse daily, plus Rx Pred taper...  HYPERLIPID>  On Simva40 & asked to take it nightly to gauge it's effectiveness; FLP improved w/ more regular dosing; Needs better diet & wt loss...  HYPOTHYROID>  On Armor Thyroid which she insists upon (prev from DrDoerr); TSH looks good when she takes it everyday; Clinically euthyroid, Continue same...  HH/ REFLUX>  Asked to be diligent w/ antireflux measures including the Protonix40Bid & ZantacQhs if nec...  Vit D Deif>  she stopped the 50K treatment on her own; VitD level 10/15= 22 & asked to take 5000u daily OTC supplement...  OVERWEIGHT>  We reviewed diet + exercise needed to lose wt...  Other medical issues as noted...   Patient's Medications  New Prescriptions   PREDNISONE (DELTASONE) 20 MG TABLET    Use as directed   UMECLIDINIUM BROMIDE (INCRUSE ELLIPTA) 62.5 MCG/INH AEPB    Inhale 1 puff into the lungs daily.  Previous Medications   ALBUTEROL (PROVENTIL HFA;VENTOLIN HFA) 108 (90 BASE) MCG/ACT INHALER    Inhale 2 puffs into the lungs every 6 (six) hours as needed for wheezing.   ASPIRIN 81 MG TABLET    Take 81 mg by mouth daily as needed.   CETIRIZINE (ZYRTEC) 10 MG TABLET    Take 10 mg by mouth daily as needed.   FLUTICASONE-SALMETEROL (ADVAIR DISKUS) 500-50 MCG/DOSE AEPB    Inhale one puff by mouth every twelve hours    HYDROCODONE-HOMATROPINE (HYCODAN) 5-1.5 MG/5ML SYRUP    Take 5 mLs by mouth every 6 (six) hours as needed for cough.   PREDNISONE (STERAPRED UNI-PAK) 5 MG TABS TABLET    Take as  directed   PSEUDOEPHEDRINE-IBUPROFEN (ADVIL COLD & SINUS LIQUI-GELS) 30-200 MG CAPS    As needed   RANITIDINE (ZANTAC) 75 MG TABLET    Take 1 tablet (75 mg total) by mouth at bedtime as needed.   TIOTROPIUM BROMIDE MONOHYDRATE (SPIRIVA RESPIMAT) 2.5 MCG/ACT AERS    Inhale 2 puffs into the lungs daily.  Modified Medications   Modified Medication Previous Medication   MONTELUKAST (SINGULAIR) 10 MG TABLET montelukast (SINGULAIR) 10 MG tablet      Take 1 tablet (10 mg total) by mouth at bedtime.    Take 1 tablet (10 mg total) by mouth at bedtime.   PANTOPRAZOLE (PROTONIX) 40 MG TABLET pantoprazole (PROTONIX) 40 MG tablet      Take 1 tablet (40 mg total) by mouth 2 (two) times daily.    Take 1 tablet (40 mg total) by mouth 2 (two) times daily.   SIMVASTATIN (ZOCOR) 40 MG TABLET simvastatin (ZOCOR) 40 MG tablet      Take 1 tablet (40 mg total) by mouth at bedtime.    Take 1 tablet (40 mg total) by mouth at bedtime.   THYROID (ARMOUR) 60 MG TABLET thyroid (ARMOUR) 60 MG tablet      Take 2 tablets by mouth once daily    Take 2 tablets by mouth once daily  Discontinued Medications   No medications on file

## 2015-07-18 NOTE — Telephone Encounter (Signed)
Spoke with pharmacist, verified dosage of prednisone according to last office note.  Nothing further needed.

## 2015-07-18 NOTE — Patient Instructions (Signed)
Today we updated your med list in our EPIC system...    Continue your current medications the same...  Today we checked your follow up FASTING blood work...    We will contact you w/ the results when available...   We decided to add INCRUSE- one spray daily added to your [place] to help your Asthma...  We also wrote for a PREDNISONE 20mg  tabs in a tapering schedule due to your recent asthma exacerbation>    Start w/ one tab twice daily for 4 days...    Then decrease to 1 tab each AM for 4 days...    Then decrease to 1/2 tab daily each AM for 4 days...    Then decrease to 1/2 tab every other day til gone (1/2, 0, 1/2, 0, etc)...  You should get the 2016 flu vaccine when you are over this event...  Call for any questions...  Let's plan a follow up visit in 89mo, sooner if needed for problems.Marland KitchenMarland Kitchen

## 2015-07-19 ENCOUNTER — Encounter: Payer: Self-pay | Admitting: Pulmonary Disease

## 2015-07-19 ENCOUNTER — Other Ambulatory Visit: Payer: Self-pay | Admitting: *Deleted

## 2015-07-19 MED ORDER — FLUTICASONE-SALMETEROL 500-50 MCG/DOSE IN AEPB: INHALATION_SPRAY | RESPIRATORY_TRACT | Status: DC

## 2015-07-19 MED ORDER — ALBUTEROL SULFATE HFA 108 (90 BASE) MCG/ACT IN AERS: 2.0000 | INHALATION_SPRAY | Freq: Four times a day (QID) | RESPIRATORY_TRACT | Status: DC | PRN

## 2015-07-19 MED ORDER — DOXYCYCLINE HYCLATE 100 MG PO TABS: 100.0000 mg | ORAL_TABLET | Freq: Two times a day (BID) | ORAL | Status: DC

## 2015-07-19 NOTE — Telephone Encounter (Signed)
Per SN,  Call in doxycycline 100mg  BID x 7 days #14 to pt's pharmacy Refill pt's Advair and ProAir Advise pt to call office back if not better  Attempted to call pt on cell # and was disconnected Left message on houce # to call office back Sent pt email stating that we were calling in antibiotic for her  Orders sent electronically to pt's pharmacy   Nothing further is needed

## 2015-07-19 NOTE — Telephone Encounter (Signed)
Please have SN address. Thanks.

## 2015-11-03 ENCOUNTER — Telehealth: Payer: Self-pay | Admitting: Pulmonary Disease

## 2015-11-03 MED ORDER — LORAZEPAM 1 MG PO TABS
ORAL_TABLET | ORAL | Status: DC
Start: 2015-11-03 — End: 2016-07-25

## 2015-11-03 NOTE — Telephone Encounter (Signed)
Per Dr. Lenna Gilford:  Ativan 1mg  #50 1/2 - 1 tablet PO TID PRN for nerves  Rx called into pharmacy. Called and left detailed message on voicemail advising patient that medication has been called into pharmacy and asked that she contact our office if she needs anything during this trying time.  Expressed condolence regarding sister's passing.

## 2015-11-03 NOTE — Telephone Encounter (Signed)
Pt cb, [phone]

## 2015-11-03 NOTE — Telephone Encounter (Signed)
Patient says that her sister passed this morning and she is a barrel of nerves and wants to get something to help.  Someone gave her an Ativan and it seemed to help a little.    Pharmacy: [facility] [place]  Allergies  Allergen Reactions  . Azithromycin     zpak does not work for the pt   Current Outpatient Prescriptions on File Prior to Visit  Medication Sig Dispense Refill  . albuterol (PROVENTIL HFA;VENTOLIN HFA) 108 (90 BASE) MCG/ACT inhaler Inhale 2 puffs into the lungs every 6 (six) hours as needed for wheezing. 1 Inhaler 11  . aspirin 81 MG tablet Take 81 mg by mouth daily as needed.    . cetirizine (ZYRTEC) 10 MG tablet Take 10 mg by mouth daily as needed.    . doxycycline (VIBRA-TABS) 100 MG tablet Take 1 tablet (100 mg total) by mouth 2 (two) times daily. 14 tablet 0  . Fluticasone-Salmeterol (ADVAIR DISKUS) 500-50 MCG/DOSE AEPB Inhale one puff by mouth every twelve hours 60 each 11  . HYDROcodone-homatropine (HYCODAN) 5-1.5 MG/5ML syrup Take 5 mLs by mouth every 6 (six) hours as needed for cough. 240 mL 0  . montelukast (SINGULAIR) 10 MG tablet Take 1 tablet (10 mg total) by mouth at bedtime. 30 tablet 11  . pantoprazole (PROTONIX) 40 MG tablet Take 1 tablet (40 mg total) by mouth 2 (two) times daily. 60 tablet 11  . predniSONE (DELTASONE) 20 MG tablet Use as directed 20 tablet 0  . predniSONE (STERAPRED UNI-PAK) 5 MG TABS tablet Take as directed 1 each 2  . Pseudoephedrine-Ibuprofen (ADVIL COLD & SINUS LIQUI-GELS) 30-200 MG CAPS As needed    . ranitidine (ZANTAC) 75 MG tablet Take 1 tablet (75 mg total) by mouth at bedtime as needed. 30 tablet 11  . simvastatin (ZOCOR) 40 MG tablet Take 1 tablet (40 mg total) by mouth at bedtime. 30 tablet 11  . thyroid (ARMOUR) 60 MG tablet Take 2 tablets by mouth once daily 60 tablet 11  . Tiotropium Bromide Monohydrate (SPIRIVA RESPIMAT) 2.5 MCG/ACT AERS Inhale 2 puffs into the lungs daily. (Patient not taking: Reported on 07/18/2015) 1  Inhaler 3  . Umeclidinium Bromide (INCRUSE ELLIPTA) 62.5 MCG/INH AEPB Inhale 1 puff into the lungs daily. 30 each 2   No current facility-administered medications on file prior to visit.

## 2016-01-16 ENCOUNTER — Ambulatory Visit (INDEPENDENT_AMBULATORY_CARE_PROVIDER_SITE_OTHER)
Admission: RE | Admit: 2016-01-16 | Discharge: 2016-01-16 | Disposition: A | Discharge status: Home / Self Care | Payer: 59 | Source: Ambulatory Visit | Attending: Pulmonary Disease | Admitting: Pulmonary Disease

## 2016-01-16 ENCOUNTER — Telehealth: Payer: Self-pay | Admitting: Pulmonary Disease

## 2016-01-16 ENCOUNTER — Ambulatory Visit (INDEPENDENT_AMBULATORY_CARE_PROVIDER_SITE_OTHER): Payer: 59 | Admitting: Pulmonary Disease

## 2016-01-16 ENCOUNTER — Encounter: Payer: Self-pay | Admitting: Pulmonary Disease

## 2016-01-16 ENCOUNTER — Other Ambulatory Visit (INDEPENDENT_AMBULATORY_CARE_PROVIDER_SITE_OTHER): Payer: 59

## 2016-01-16 VITALS — BP 116/74 | HR 78 | Temp 98.4°F | Ht 65.0 in | Wt 179.8 lb

## 2016-01-16 DIAGNOSIS — F411 Generalized anxiety disorder: Secondary | ICD-10-CM

## 2016-01-16 DIAGNOSIS — J4541 Moderate persistent asthma with (acute) exacerbation: Secondary | ICD-10-CM | POA: Diagnosis not present

## 2016-01-16 DIAGNOSIS — J387 Other diseases of larynx: Secondary | ICD-10-CM

## 2016-01-16 DIAGNOSIS — J3089 Other allergic rhinitis: Secondary | ICD-10-CM

## 2016-01-16 DIAGNOSIS — E039 Hypothyroidism, unspecified: Secondary | ICD-10-CM

## 2016-01-16 DIAGNOSIS — M150 Primary generalized (osteo)arthritis: Secondary | ICD-10-CM

## 2016-01-16 DIAGNOSIS — K219 Gastro-esophageal reflux disease without esophagitis: Secondary | ICD-10-CM | POA: Insufficient documentation

## 2016-01-16 DIAGNOSIS — M8949 Other hypertrophic osteoarthropathy, multiple sites: Secondary | ICD-10-CM

## 2016-01-16 DIAGNOSIS — M159 Polyosteoarthritis, unspecified: Secondary | ICD-10-CM

## 2016-01-16 LAB — TSH: TSH: 0.1 u[IU]/mL — ABNORMAL LOW (ref 0.35–4.50)

## 2016-01-16 MED ORDER — METHYLPREDNISOLONE 8 MG PO TABS: ORAL_TABLET | ORAL | Status: DC

## 2016-01-16 NOTE — Patient Instructions (Signed)
Today we updated your med list in our EPIC system...    Continue your current medications the same...  Continue your [id number]- twice daily, Singulair10 daily, ProairHFA rescue inhaler as needed...  Today we wrote for a slow taping schedule of MEDROL 8mg  tabs >>    Start w/ one tab twice daily for 1 week...    Then decrease to 1 tab each AM for 1 week...    Then decrease to 1/2 tab each AM for 1 week...    Then decrease to 1/2 tab every other day til gone (1/2, 0, 1/2, 0, etc).  Follow a vigorous antireflux regimen >>    Take your PROTONIX40 about 30 min before the evening meal...    Do not eat or drink much after dinner in the evening...    Elevate the head of your bed on 6" blocks...  Today we checked a Spirometry breathing test, a follow up CXR, and we rechecked your TSH thyroid blood test w/ you on the [facility] thyroid daily...    We will contact you w/ the results when available...   Call for any questions...  Let's plan a follow up visit in 6 weeks, sooner if needed for problems.Marland KitchenMarland Kitchen

## 2016-01-16 NOTE — Telephone Encounter (Signed)
Last ov with SN on 01/16/16   Patient Instructions     Today we updated your med list in our EPIC system...  Continue your current medications the same...  Continue your [id number]- twice daily, Singulair10 daily, ProairHFA rescue inhaler as needed...  Today we wrote for a slow taping schedule of MEDROL 8mg  tabs >>  Start w/ one tab twice daily for 1 week...  Then decrease to 1 tab each AM for 1 week...  Then decrease to 1/2 tab each AM for 1 week...  Then decrease to 1/2 tab every other day til gone (1/2, 0, 1/2, 0, etc).  Follow a vigorous antireflux regimen >>  Take your PROTONIX40 about 30 min before the evening meal...  Do not eat or drink much after dinner in the evening...  Elevate the head of your bed on 6" blocks...  Today we checked a Spirometry breathing test, a follow up CXR, and we rechecked your TSH thyroid blood test w/ you on the [facility] thyroid daily...  We will contact you w/ the results when available...   Call for any questions...  Let's plan a follow up visit in 6 weeks, sooner if needed for problems...   Called and spoke with the pharmacist at [facility]. She states she needs clarification on how the Medrol needs to be taken. I reviewed the above instructions that were printed on the pt's AVS. She voiced understanding and had no further questions. Nothing further needed at this time.

## 2016-01-17 NOTE — Progress Notes (Signed)
Quick Note:  Called spoke with patient and discussed lab results / recs as stated by SN. Pt voiced her understanding and denied any questions / concerns. Pt is scheduled to see SN in 6 weeks (5.23.17) and will have her TSH repeated at that time. ______

## 2016-01-18 ENCOUNTER — Telehealth: Payer: Self-pay

## 2016-01-18 NOTE — Telephone Encounter (Signed)
Per TP:  Robitussin DM prn --- Called pt, LM with detailed rec's.  Will hold in triage to follow up Monday (01/22/16) morning.

## 2016-01-18 NOTE — Telephone Encounter (Signed)
SN please advise:  Notes Recorded by Beckie Busing, CMA on 01/18/2016 at 12:13 PM Spoke with patient regarding results and recommendations. Patient voiced understanding.  Pt states that Mucinex is giving her a very bad headache. Pt would like to know what else would be recommended.  SN please advise. ------  Notes Recorded by Noralee Space, MD on 01/17/2016 at 10:18 AM Please notify patient>  CXR is clear x for basilar atelectasis from thick secretions and mucus plugging... REC- add OTC MUCINEX 600mg  tabs- 2 tabs Bid w/ fluids & work to expectorate the phlegm... Continue meds as discussed...   Allergies  Allergen Reactions  . Azithromycin     zpak does not work for the pt   Current Outpatient Prescriptions on File Prior to Visit  Medication Sig Dispense Refill  . albuterol (PROVENTIL HFA;VENTOLIN HFA) 108 (90 BASE) MCG/ACT inhaler Inhale 2 puffs into the lungs every 6 (six) hours as needed for wheezing. 1 Inhaler 11  . aspirin 81 MG tablet Take 81 mg by mouth daily.     . cetirizine (ZYRTEC) 10 MG tablet Take 10 mg by mouth daily as needed.    . cholecalciferol (VITAMIN D) 1000 units tablet Take 1,000 Units by mouth daily.    . Fluticasone-Salmeterol (ADVAIR DISKUS) 500-50 MCG/DOSE AEPB Inhale one puff by mouth every twelve hours 60 each 11  . HYDROcodone-homatropine (HYCODAN) 5-1.5 MG/5ML syrup Take 5 mLs by mouth every 6 (six) hours as needed for cough. (Patient not taking: Reported on 01/16/2016) 240 mL 0  . LORazepam (ATIVAN) 1 MG tablet 1/2 - 1 tablet three times daily as needed for nerves (Patient not taking: Reported on 01/16/2016) 50 tablet 0  . methylPREDNISolone (MEDROL) 8 MG tablet Take as directed by Dr Lenna Gilford 30 tablet 0  . montelukast (SINGULAIR) 10 MG tablet Take 1 tablet (10 mg total) by mouth at bedtime. 30 tablet 11  . pantoprazole (PROTONIX) 40 MG tablet Take 1 tablet (40 mg total) by mouth 2 (two) times daily. 60 tablet 11  . predniSONE (DELTASONE) 20 MG tablet Use as  directed 20 tablet 0  . Pseudoephedrine-Ibuprofen (ADVIL COLD & SINUS LIQUI-GELS) 30-200 MG CAPS As needed    . ranitidine (ZANTAC) 75 MG tablet Take 1 tablet (75 mg total) by mouth at bedtime as needed. 30 tablet 11  . simvastatin (ZOCOR) 40 MG tablet Take 1 tablet (40 mg total) by mouth at bedtime. 30 tablet 11  . thyroid (ARMOUR) 60 MG tablet Take 2 tablets by mouth once daily 60 tablet 11  . Tiotropium Bromide Monohydrate (SPIRIVA RESPIMAT) 2.5 MCG/ACT AERS Inhale 2 puffs into the lungs daily. (Patient not taking: Reported on 01/16/2016) 1 Inhaler 3  . Umeclidinium Bromide (INCRUSE ELLIPTA) 62.5 MCG/INH AEPB Inhale 1 puff into the lungs daily. (Patient not taking: Reported on 01/16/2016) 30 each 2   No current facility-administered medications on file prior to visit.

## 2016-01-20 ENCOUNTER — Encounter: Payer: Self-pay | Admitting: Pulmonary Disease

## 2016-01-20 NOTE — Progress Notes (Signed)
Subjective:    Patient ID: Amanda [name], female    DOB: Apr 27, 1956, 60 y.o.   MRN: [id number]  HPI 60 y/o WF here for a follow up visit... she has multiple medical problems as noted below... she was prev followed by DrWelty-Wolfe at [facility] for her Asthma, then switched to [facility], but both of them have left... I tried to get her to see Dr. Yates Decamp but pt has refused further referrals for her Asthma... the concern has been for the developement of fixed airway obstruction from her Asthma due to poor control and medication non-compliance in the past...  ~  SEE PREV EPIC NOTES FOR OLDER DATA >>    LABS 10/14:  FLP- at goals on Simva40;  Chems- ok x BS=118;  CBC- wnl;  TSH=0.43;  VitD=31 & rec to take 2000u daily...  ~  July 14, 2014:  25mo ROV & Darlin is c/o DOE eg- walking up her driveway but she is doing NO exercise, very busy as the Dealer at [employer]; we reviewed the need for diet, regular exercise, get wt down;  She notes "a catch" in her back from moving a certain way- rec stretching, heat, etc (she declines musc relaxers)...    Allergies & Asthma treated w/ Advair500Bid (only doing it once per day), Singulair10 Qhs (not regularly), & ProairHFA; uses the rescue inhaler 1-2/wk & asked to take AdvairBid & Singulair everyday to try and minimize the Albuterol...     She remains on Simva40 (?compliance) & Labs 10/15 showed TChol 205, TG 72, HDL 42, LDL 149... Reminded to take it everyday, low fat diet & get the wt down...    Wt is down a few lbs to 187# today & we reviewed diet & exercise needed...    She remains on Protonix & Zantac + antireflux regimen...    She remains on Armour Thyroid 60-2/d (her request) but TSH=6.70 today- she refuses Synthroid, reminded to take her medication every day! We reviewed prob list, meds, xrays and labs> see below for updates >> she refuses Flu vaccine "I'm not putting that in my body" and I explained the vaccine's  mechanism & she said "that's bull"...   LABS 10/15:  FLP- not at goal on Simva40 (?compliance);  Chems- ok w/ BS=108;  CBC- wnl x 8%eos;  TSH=6.70 & reminded to take her ArmourThyroid everyday;  VitD=22 & rec OTC VitD5000u daily...  ~  January 13, 2015:  78mo ROV & [name] reports breathing at baseline but seasonal allergies are kicking in; she also c/o some LBP, thinks it's her bed, offered XRays vs Ortho but she declines, advised heat/ back brace/ Tylenol etc... We reviewed the following medical problems during today's office visit >>     AR, Asthma> on Advair500Bid, ProairHFA prn, & Zyrtek10 (she stopped the Singulair on her own); she is an excellent Xolair cand but they couldn't get it worked out w/ her insurance (copay too $$); hasn't used any Pred in ?62yr...    Her medical management has been repeatedly hampered over the years by medication non-compliance...     Hyperlipid> on Simva40; last FLP 10/15 showed TChol 205, TG 72, HDL 42, LDL 149=> not as good as prev & I suspect compliance issues- asked to take it every eve, + better diet/ exerc/ wt reduction...    Hypothyroid> on ArmourThyroid60-2 tabs/d & she refuses Synthroid Rx; TSH 10/15 = 6.70 & she is clinically euthyroid, again reminded of the need for 100% compliance w/  dosing...    Overweight> wt down to 186# & we reviewed diet, exercise, wt reduction strategies...    HH, Reflux> on Protonix40Bid + Zantac75 as needed; she knows to use max antireflux regimen w/ elev HOB, NPO after dinner, etc...    GYN> prev seen by [facility] but she stopped going when they didn't help her hot flashes; she is again advised to estab w/ another GYN for Pelvic, Pap, Mammography, BMD, etc (she still has not complied w/ this request)...     DJD> hx left knee pain & arthroscopy 1999 by DrPaul; she uses OTC analgesics as needed...    VitD defic> intermittently on VitD50K weekly from [place]; stopped on her own- took OTC VitD supplement but she stopped that  too; last VitD level 10/14 = 31 & asked to resume daily vit supplements (never did- asked again)...    Anxiety> offered Alpraz or similar Benzo in past but she declines med rx... 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 PLAN>>  We discussed adding Spiriva Respimat- 2sp daily due to her Spirometry report; continue REGULAR DOSING of all meds including Advair500Bid; she refuses to take Mucinex, refuses nasal sprays, refuses to sched w/ Gyn...  ~  July 18, 2015:  10mo ROV & [name] presents w/ recent incr SOB, cough, wheezing & chest tightness which she thinks is related to "weather changes and allergies";  Notes baseline DOE w/ "a bunch of stuff" like housework, taking out the trash, etc; we have discussed need for diet, exercise, wt reduction on mult occais but she has been unable to comply...    Asthma/ AB> on Advair500Bid, VentolinHFA prn (uses thisQid recently); she hasn't had Pred in a long time & stopped Spiriva & Singulair on her own; I have rec that she add INCRUSE one inhalation daily & Pred20mg - tapering schedule...    HL> supposed to be on Simva40; [place] 10/16 shows TChol 219, TG 86, HDL 51, LDL 151 and a call to her Pharm indicates no recent refills of this med- we discussed taking the stain med every day, regularly, preferrably Qhs...    Hypothyroid> supposed to be on her Armour Thyroid 60-2tabs daily; LABS 07/18/15 showed TSH=10.51 and she is reminded to take it every day so we can determine her optimal dose...    Overweight>  Weight today is 188# up 2# & we reviewed diet/ exercise/ wt reduction...    Vit D defic>  Supposed to be on OTC Vit D supplement daily but she declines to take it regularly; LABS 07/2015 shows VitD level = 17 and asked to restart VitD 50K weekly dosing...   EXAM shows Afeb, VSS, O2sat=96% on RA;  HEENT- neg, mallampati2;  Chest- mild exp wheezing, scat rhonchi, no rales or consolidation;  Heart- RR w/o m/r/g;  Abd- soft, nontender, neg;  Ext- w/o c/c/e;  Neuro- intact...  LABS 10/17/14>  FLP- not at goals but she hasn't filled the Simva40 in several months;  Chems- wnl x BS=112;  CBC- wnl;  TSH=10.51;  VitD=16.6.Marland KitchenMarland Kitchen  IMP/PLAN>>  Ogechi has an AB exac & we will treat w/ Pred tapering sched but she needs to take her regular meds daily as maintenance, we discussed adding INCRUSE to her Advair;  Her LABS also reflect poor compliance w/ medications and she PROMISES to  do better...  ~  January 16, 2016:  7mo ROV & Arbutus reports increased stress as her sister passed away in 2022-10-26 w/ lung cancer [name]- never smoked, treated in [place]);  She also notes that her asthma has been acting up lately and that her cat is ill- now required to live inside w/ resultant incr asthma symptoms as well; c/o nasal drainage, dry hacking cough, chest tight & wheezing, notes SOB/DOE w/ regular walking, mild cough & min beige sput; she states she had a weird reaction to Doxy prev & refuses to take this antibiotic again...     Allergies & cat now resides indoors>  We reviewed need for antihist, Flonase, saline, and a good electrostatic air cleaner in her bedroom...    Asthma/ AB>  On Advair500Bid, ProairHFA- using it Bid, not taking the INCRUSE, has Tessalon & Hycodan for prn use;       HL>  Says she is doing the simva40 daily now but not fasting today...    Hypothy>  She says she is taking the Armour thyroid60-2 Qam regularly; TSH today 01/16/16 = 0.10 indicating over-replaced when taking 120mg  every day- REC to decr to 60mg  daily...    Anxiety>  Her sister passed away, her cat is ill & living indoors now affecting her allergies & asthma acting up; offered Alpraz or similar rx but she declines... EXAM shows Afeb, VSS, O2sat=95% on RA;  HEENT- neg, mallampati2;  Chest- mild exp wheezing,  scat rhonchi, no rales or consolidation;  Heart- RR w/o m/r/g;  Abd- soft, nontender, neg;  Ext- w/o c/c/e;  Neuro- intact...  CXR 01/16/16>  Norm heart size, clear lungs w/ mild left basilar atx, DJD Tspine, NAD...   Spirometry 01/16/16>  FVC=1.88 (58%), FEV1=1.10 (43%), %1sec=58%, mid-flows reduced at 18% predicted;  This is c/o a mod obstructive ventilatory defect  And GOLD Stage3 COPD...]  LABS 01/16/16>  TSH= 0.10 on the Armour thyroid 60mg - 2/d... This is oversuppressed & needs to back off to 60mg /d. IMP/PLAN>>  Shantez will not take any additional inhalers so we will Rx w/ Medrol 8mg  slow wean + her Advair500Bid & ProairHFA rescue;  She will re-double her efforts at allergy Rx w/ Antihist Qam, Flonase Bid, Saline as needed & get the electrostatic air cleaner for her bedroom;  Finally add vigorous antireflux regimen w/ Protonix40 before dinner, NPO after dinner (she refuses stating "I drink stuff", Elev HOB 6"... Rov in ~6weeks recheck.         Problem List:  ALLERGIC RHINITIS (ICD-477.9)  ++allergy testing by DrESL in the 1980's w/ reactions to grasses, ragweed, dust, molds, cats, dog... prev on shots... ~  10/12:  She reports a new kitten at home, but denies allergy or asthma exac so far... ~  10/13:  I cannot find documentation of IgE/ RAST testing (?done at [facility] in the past, she was in a drug trial 2006 at [facility]); we sent labs today> IgE=  68 & RAST pos for Cat> Dust> Dog> molds...  We decided to apply for [facility] Rx => they could not secure payment from her insurance... ~  10/14:  on Zyrtek, Singulair10, Advair500, AlbutHFA prn; breathing has been good, allergies acting up & she has dermatitis on face- wants Dosepak- ok ~  4/15: presents c/o spring allergy symptoms on Singulair10 & Zyrtek OTC, refuses nasal sprays; Rec Depo80, Pred dosepak, etc... ~  4/16: this spring hasn't been so bad, yet; she stopped the Singulair on her own, won't use any nasal  sprays; advised to call if round of Pred  needed...  ASTHMA (ICD-493.90) - on ADVAIR 500Bid, & VENTOLIN HFA as needed... she admits to using Advair once dailly & is encouraged to take meds regularly as perscribed; similarly she overuses the rescue inhaler & admonished to not use it more than Qid... ~  baseline CXR shows sl incr markings, NAD... ~  PFT's 5/05 showed FVC= 2.47 (76%), FEV1= 1.51 (56%), %1sec=61, mid-flows= 22%pred... ~  in 2006 she entered the EXTRA trial at [facility] which was a double blind trial testing XOLAIR... ~  last note from [facility] 6/07 DrCarraway w/ concern for her fixed airway obstruction... pt refused f/u in the Post... ~  f/u PFT 10/11 showed FVC= 2.52 (71%), FEV1= 1.69 (60%), %1sec=67, mid-flows= 38%pred. ~  CXR 10/11 showed clear lungs, NAD... ~  10/13:  Treated for exac w/ Pred course; rechecked labs including IgE= 68 & RAST pos for Cat> Dust> Dog> molds... We decided to apply for [facility] Rx but they could not arrange payment from her insurance... She states that the 1st shot really helped... ~  10/14: on Zyrtek, Singulair10, Advair500, AlbutHFA prn; breathing has been good, allergies acting up & she has dermatitis on face- wants Dosepak- ok. ~  4/15: on Zyrtek10, Advair500Bid, Singulair10, ProairHFA prn (lately incr to qid); she is an excellent Xolair cand but they couldn't get it worked out w/ her insurance; hasn't used any Pred in >65mo til this visit w/ spring pollen. ~  10/15: on same meds but only doing the Advair once/d & not regular w/ the Singulair; as a result she continues to use the rescue inhaled 1-2/wk she says; we discussed taking meds regularly... 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 prev on Zetia, now on SIMVASTATIN 40mg /d but admits to intermittent dosing. ~  4/11: pt presents w/ request to start writing Simva Rx & monitor labs since DrDoerr has left [place]... encouraged to take med regularly & follow low chol/ low fat diet... ~  FLP 10/11 on Simva40 showed TChol 198, TG 110, HDL 46, LDL 131 ~  FLP 10/12 on Simva40 showed TChol 172, TG 82, HDL 51, LDL 105... Improved, reminded of diet & compliance. ~  FLP 10/13 on Simva40 (not taking daily) showed TChol 229, TG 60, HDL 48, LDL 170==> rec to take med daily & refer to [facility]! ~  FLP 10/14 on Simva40 showed TChol 138, TG 78, HDL 42, LDL 81... Great job, continue daily dosing... ~  [place] 10/15 on Simva40 (?compliance) showed TChol 205, TG 72, HDL 42, LDL 149... Reminded to take it everyday, low fat diet & get the wt down. ~  4/16: reminded to take med every eve + diet/ exercise/ wt reduction...  HYPOTHYROIDISM (ICD-244.9) - taking ARMOUR THYROID 60mg - 2tabs daily... she was followed by DrDoerr, Endocrine in [place] for thyroid and her hyperlipidemia... she was prev on Levothyroid and had requested change to Armour Thyroid because she believed the Levothy was making her hair fall out... ~  4/11:  pt reqested that we write her Rx for the  Armour Thyroid 60mg  since DrDoerr has left [place]... pt is uncouraged to take the med daily. ~  labs 10/11 showed TSH= 4.69 ~  Labs 10/12 on ArmourThyroid60-2/d showed TSH= 2.88... rec to continue same. ~  Labs 10/13 on ArmourThyroid60-2/d (not taking daily) showed TSH= 5.91==> rec to take med every day!!! ~  Labs 10/14 on ArmourThyroid60-2/d showed TSH=0.43 ~  Labs 10/15 on ArmourThyroid60-2/d showed TSH=6.70 and reminded to take all meds every day...  OVERWEIGHT (ICD-278.02) - we reviewed diet + exercise program required to lose weight... ~  weight 9/10 = 200#,   she is 5\' 5"  tall,  BMI= 34. ~  weight 4/11 = 192#,  this is as good as she's been in several yrs. 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 she continues to treat her reflux as needed w/ the Nexium and OTC Zantac even though we have felt this plays a signif roll in her Asthma... the consultants at [facility] agreed, but were no more effective at getting her to take her meds regularly... 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 ~  meds re-written & encouraged to take PROTONIX 40mg /d- 30 min before the 1st meal, and Zantac 75- 1-2 at bedtime... ~  4/14: she requests switch to NEXIUM40 from the Protonix...  GYN = DrRomaine in the past... pt states that she stopped going because she couldn't help her w/ her hot flashes. ~  4/14: reminded of the importance of regular Gyn checks- pelvic, Pap, Mammograms, etc...  OSTEOARTHRITIS (ICD-715.90) - she had a left knee arthroscopy by DrPaul in 1999...  VITAMIN D DEFICIENCY (ICD-268.9) - pt was on Vit D 50,000 u weekly from DrDoerr... we don't have prev labs from her but pt indicates that the Vit D level was "really really low" so we will refill the Rx & f/u Vit D level later. ~  labs 10/11 showed Vit D level = 44... OK to switch to 1-2000u daily OTC supplement. ~  4/12:  Pt requested to go back on Vit D 50000u weekly... ~  10/12:  Pt indicated that she stopped filling the Rx for Vit D 50K weekly, ?why? ~  BMD 5/14 was wnl w/ lowest Tscore = -0.2.Marland KitchenMarland Kitchen ~  10/14: she stopped the VitD 50K weekly Rx on her own; Labs 10/14 showed VitD level = 31 & she is requested to take 2000u OTC supplement daily  ~  Labs 10/15  showed VitD level = 22 and she is rec to take OTC VitD supplement ~5000u daily... ~  Pt reminded to take all meds regularly + OTC VitD supplements...  ANXIETY (ICD-300.00)  Hx of SHINGLES (ICD-053.9) - she presented w/ cryptic left flank pain 12/09 that turned out to be Shingles... treated w/ Valtrex, Pred, Vicodin.  HEALTH MAINTENANCE:   ~  GI:  She has not established w/ a GI for routine screening colonoscopy- reminded again about this important screening procedure! ~  GYN:  GYN = DrRomaine in the past... pt states that she stopped going because she couldn't help her  w/ her hot flashes; she is reminded of the need to establish w/ another GYN for PAP, Mammograms, BMD etc... ~  Immuniz:  she refuses Pneumovax & Flu vaccine... She received TDAP 2012 prior to a trip to [place].   Past Surgical History  Procedure Laterality Date  . Left knee arthroscopy  1999    by Dr. Eddie Dibbles  . Breast reductions  2000    in [place]    Outpatient Encounter Prescriptions as of 01/16/2016  Medication Sig  . albuterol (PROVENTIL HFA;VENTOLIN HFA) 108 (90 BASE) MCG/ACT inhaler Inhale 2 puffs into the lungs every 6 (six) hours as needed for wheezing.  Marland Kitchen aspirin 81 MG tablet Take 81 mg by mouth daily.   . cetirizine (ZYRTEC) 10 MG tablet Take 10 mg by mouth daily as needed.  . cholecalciferol (VITAMIN D) 1000 units tablet Take 1,000 Units by mouth daily.  . Fluticasone-Salmeterol (ADVAIR DISKUS) 500-50 MCG/DOSE AEPB Inhale one puff by mouth every twelve hours  . montelukast (SINGULAIR) 10 MG tablet Take 1 tablet (10 mg total) by mouth at bedtime.  . pantoprazole (PROTONIX) 40 MG tablet Take 1 tablet (40 mg total) by mouth 2 (two) times daily.  . predniSONE (DELTASONE) 20 MG tablet Use as directed  . Pseudoephedrine-Ibuprofen (ADVIL COLD & SINUS LIQUI-GELS) 30-200 MG CAPS As needed  . ranitidine (ZANTAC) 75 MG tablet Take 1 tablet (75 mg total) by mouth at bedtime as needed.  . simvastatin (ZOCOR) 40 MG tablet  Take 1 tablet (40 mg total) by mouth at bedtime.  Marland Kitchen thyroid (ARMOUR) 60 MG tablet Take 2 tablets by mouth once daily  . HYDROcodone-homatropine (HYCODAN) 5-1.5 MG/5ML syrup Take 5 mLs by mouth every 6 (six) hours as needed for cough. (Patient not taking: Reported on 01/16/2016)  . LORazepam (ATIVAN) 1 MG tablet 1/2 - 1 tablet three times daily as needed for nerves (Patient not taking: Reported on 01/16/2016)  . Tiotropium Bromide Monohydrate (SPIRIVA RESPIMAT) 2.5 MCG/ACT AERS Inhale 2 puffs into the lungs daily. (Patient not taking: Reported on 01/16/2016)  . Umeclidinium Bromide (INCRUSE ELLIPTA) 62.5 MCG/INH AEPB Inhale 1 puff into the lungs daily. (Patient not taking: Reported on 01/16/2016)  . [DISCONTINUED] doxycycline (VIBRA-TABS) 100 MG tablet Take 1 tablet (100 mg total) by mouth 2 (two) times daily. (Patient not taking: Reported on 01/16/2016)  . [DISCONTINUED] predniSONE (STERAPRED UNI-PAK) 5 MG TABS tablet Take as directed (Patient not taking: Reported on 01/16/2016)    Allergies  Allergen Reactions  . Azithromycin     zpak does not work for the pt    Current Medications, Allergies, Past Medical History, Past Surgical History, Family History, and Social History were reviewed in [employer] record.    Review of Systems       See HPI - all other systems neg except as noted...      The patient complains of dyspnea on exertion.  The patient denies anorexia, fever, weight loss, weight gain, vision loss, decreased hearing, hoarseness, chest pain, syncope, peripheral edema, prolonged cough, headaches, hemoptysis, abdominal pain, melena, hematochezia, severe indigestion/heartburn, hematuria, incontinence, muscle weakness, suspicious skin lesions, transient blindness, difficulty walking, depression, unusual weight change, abnormal bleeding, enlarged lymph nodes, and angioedema.     Objective:   Physical Exam      WD, overweight, 60 y/o WF in NAD... GENERAL:  Alert &  oriented; & cooperative... HEENT:  [place]/AT, EOM-wnl, PERRLA, Fundi-benign, EACs-clear, TMs-wnl, NOSE-clear, THROAT-clear & wnl. NECK:  Supple w/ full ROM; no JVD;  normal carotid impulses w/o bruits; no thyromegaly or nodules palpated; no lymphadenopathy. CHEST:  Clear to P & A; decr BS bilat; without wheezes/ rales/ or rhonchi heard... HEART:  Regular Rhythm; without murmurs/ rubs/ or gallops detected... ABDOMEN:  Soft & nontender; normal bowel sounds; no organomegaly or masses palpated... EXT: without deformities, mild arthritic changes; no varicose veins/ venous insuffic/ or edema. NEURO:  CN's intact; no focal neuro deficits... DERM:  No lesions noted; no rash etc...  RADIOLOGY DATA:  Reviewed in the EPIC EMR & discussed w/ the patient...  LABORATORY DATA:  Reviewed in the EPIC EMR & discussed w/ the patient...   Assessment & Plan:    ASTHMA>  Asked to use the Advair Bid regularly & gauge control by NOT having to use the rescue inhaler... IgE= 68 w/ pos RAST to Cats, Dust, Dog, some molds & we tried for Xolair treatment but unfortunately this could not be arranged due to her insurance...  4/14> try Singulair=> she wants to continue this rx... 4/15> springtime exac due to pollen & treated w/ Depo80, dosepak, Hycodan... 10/15> reminded to take all regular meds REGULARLY! 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 10/16> she presents w/ an exac, only taking Advair Bid & VentolinHFA prn, she stopped the Spiriva & ?if using the Advair every day; we reviewed the need for daily meds- try Advair500Bid & Incruse daily, plus Rx Pred taper... 4/17> still not controlled & Rx hampered by medication compliance; we Rx w/ Medrol taper + her Advair500 & ProairHFA, needs allergen management & antireflux regimen...  HYPERLIPID>  On Simva40 & asked to take it nightly to gauge it's  effectiveness; FLP improved w/ more regular dosing; Needs better diet & wt loss...  HYPOTHYROID>  On Armor Thyroid which she insists upon (prev from DrDoerr); TSH looks good when she takes it everyday; Clinically euthyroid, Continue same...  HH/ REFLUX>  Asked to be diligent w/ antireflux measures including the Protonix40Bid & ZantacQhs if nec...  Vit D Deif>  she stopped the 50K treatment on her own; VitD level 10/15= 22 & asked to take 5000u daily OTC supplement...  OVERWEIGHT>  We reviewed diet + exercise needed to lose wt...  Other medical issues as noted...   Patient's Medications  New Prescriptions   METHYLPREDNISOLONE (MEDROL) 8 MG TABLET    Take as directed by Dr Lenna Gilford  Previous Medications   ALBUTEROL (PROVENTIL HFA;VENTOLIN HFA) 108 (90 BASE) MCG/ACT INHALER    Inhale 2 puffs into the lungs every 6 (six) hours as needed for wheezing.   ASPIRIN 81 MG TABLET    Take 81 mg by mouth daily.    CETIRIZINE (ZYRTEC) 10 MG TABLET    Take 10 mg by mouth daily as needed.   CHOLECALCIFEROL (VITAMIN D) 1000 UNITS TABLET    Take 1,000 Units by mouth daily.   FLUTICASONE-SALMETEROL (ADVAIR DISKUS) 500-50 MCG/DOSE AEPB    Inhale one puff by mouth every twelve hours   HYDROCODONE-HOMATROPINE (HYCODAN) 5-1.5 MG/5ML SYRUP    Take 5 mLs by mouth every 6 (six) hours as needed for cough.   LORAZEPAM (ATIVAN) 1 MG TABLET    1/2 - 1 tablet three times daily as needed for nerves   MONTELUKAST (SINGULAIR) 10 MG TABLET    Take 1 tablet (10 mg total) by mouth at bedtime.   PANTOPRAZOLE (PROTONIX) 40 MG TABLET  Take 1 tablet (40 mg total) by mouth 2 (two) times daily.   PSEUDOEPHEDRINE-IBUPROFEN (ADVIL COLD & SINUS LIQUI-GELS) 30-200 MG CAPS    As needed   RANITIDINE (ZANTAC) 75 MG TABLET    Take 1 tablet (75 mg total) by mouth at bedtime as needed.   SIMVASTATIN (ZOCOR) 40 MG TABLET    Take 1 tablet (40 mg total) by mouth at bedtime.   THYROID (ARMOUR) 60 MG TABLET    Take 2 tablets by mouth once daily   Modified Medications   No medications on file  Discontinued Medications   DOXYCYCLINE (VIBRA-TABS) 100 MG TABLET    Take 1 tablet (100 mg total) by mouth 2 (two) times daily.   PREDNISONE (STERAPRED UNI-PAK) 5 MG TABS TABLET    Take as directed

## 2016-01-22 NOTE — Telephone Encounter (Signed)
Called and spoke to pt. Informed her of the recs per TP. Pt verbalized understanding and denied any further questions or concerns at this time.

## 2016-02-12 ENCOUNTER — Other Ambulatory Visit: Payer: Self-pay | Admitting: Pulmonary Disease

## 2016-02-27 ENCOUNTER — Ambulatory Visit: Payer: 59 | Admitting: Pulmonary Disease

## 2016-03-19 ENCOUNTER — Ambulatory Visit (INDEPENDENT_AMBULATORY_CARE_PROVIDER_SITE_OTHER): Payer: 59 | Admitting: Pulmonary Disease

## 2016-03-19 ENCOUNTER — Encounter: Payer: Self-pay | Admitting: Pulmonary Disease

## 2016-03-19 VITALS — BP 124/68 | HR 80 | Temp 98.2°F | Ht 65.0 in | Wt 189.4 lb

## 2016-03-19 DIAGNOSIS — F411 Generalized anxiety disorder: Secondary | ICD-10-CM

## 2016-03-19 DIAGNOSIS — M79605 Pain in left leg: Secondary | ICD-10-CM

## 2016-03-19 DIAGNOSIS — K219 Gastro-esophageal reflux disease without esophagitis: Secondary | ICD-10-CM

## 2016-03-19 DIAGNOSIS — M159 Polyosteoarthritis, unspecified: Secondary | ICD-10-CM

## 2016-03-19 DIAGNOSIS — E039 Hypothyroidism, unspecified: Secondary | ICD-10-CM | POA: Diagnosis not present

## 2016-03-19 DIAGNOSIS — M8949 Other hypertrophic osteoarthropathy, multiple sites: Secondary | ICD-10-CM

## 2016-03-19 DIAGNOSIS — M150 Primary generalized (osteo)arthritis: Secondary | ICD-10-CM

## 2016-03-19 DIAGNOSIS — J387 Other diseases of larynx: Secondary | ICD-10-CM

## 2016-03-19 DIAGNOSIS — J3089 Other allergic rhinitis: Secondary | ICD-10-CM

## 2016-03-19 DIAGNOSIS — J4540 Moderate persistent asthma, uncomplicated: Secondary | ICD-10-CM | POA: Diagnosis not present

## 2016-03-19 MED ORDER — ETODOLAC 400 MG PO TABS: 400.0000 mg | ORAL_TABLET | Freq: Two times a day (BID) | ORAL | Status: DC

## 2016-03-19 NOTE — Progress Notes (Signed)
Subjective:    Patient ID: Amanda [name], female    DOB: [birth year], 60 y.o.   MRN: [id number]  HPI 60 y/o WF here for a follow up visit... she has multiple medical problems as noted below... she was prev followed by DrWelty-Wolfe at [place] for her Asthma, then switched to [facility], but both of them have left... I tried to get her to see Dr. Yates Decamp but pt has refused further referrals for her Asthma... the concern has been for the developement of fixed airway obstruction from her Asthma due to poor control and medication non-compliance in the past...  ~  SEE PREV EPIC NOTES FOR OLDER DATA >>    LABS 10/14:  FLP- at goals on Simva40;  Chems- ok x BS=118;  CBC- wnl;  TSH=0.43;  VitD=31 & rec to take 2000u daily...  LABS 10/15:  FLP- not at goal on Simva40 (?compliance);  Chems- ok w/ BS=108;  CBC- wnl x 8%eos;  TSH=6.70 & reminded to take her ArmourThyroid everyday;  VitD=22 & rec OTC VitD5000u daily...  ~  January 13, 2015:  39mo ROV & Amanda [name] reports breathing at baseline but seasonal allergies are kicking in; she also c/o some LBP, thinks it's her bed, offered XRays vs Ortho but she declines, advised heat/ back brace/ Tylenol etc... We reviewed the following medical problems during today's office visit >>     AR, Asthma> on Advair500Bid, ProairHFA prn, & Zyrtek10 (she stopped the Singulair on her own); she is an excellent Xolair cand but they couldn't get it worked out w/ her insurance (copay too $$); hasn't used any Pred in ?58yr...    Her medical management has been repeatedly hampered over the years by medication non-compliance...     Hyperlipid> on Simva40; last FLP 10/15 showed TChol 205, TG 72, HDL 42, LDL 149=> not as good as prev & I suspect compliance issues- asked to take it every eve, + better diet/ exerc/ wt reduction...    Hypothyroid> on ArmourThyroid60-2 tabs/d & she refuses Synthroid Rx; TSH 10/15 = 6.70 & she is clinically euthyroid, again reminded of the need for  100% compliance w/ dosing...    Overweight> wt down to 186# & we reviewed diet, exercise, wt reduction strategies...    HH, Reflux> on Protonix40Bid + Zantac75 as needed; she knows to use max antireflux regimen w/ elev HOB, NPO after dinner, etc...    GYN> prev seen by [facility] but she stopped going when they didn't help her hot flashes; she is again advised to estab w/ another GYN for Pelvic, Pap, Mammography, BMD, etc (she still has not complied w/ this request)...     DJD> hx left knee pain & arthroscopy 1999 by DrPaul; she uses OTC analgesics as needed...    VitD defic> intermittently on VitD50K weekly from [place]; stopped on her own- took OTC VitD supplement but she stopped that too; last VitD level 10/14 = 31 & asked to resume daily vit supplements (never did- asked again)...    Anxiety> offered Alpraz or similar Benzo in past but she declines med rx... 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 PLAN>>  We discussed adding Spiriva Respimat- 2sp daily due to her Spirometry report; continue REGULAR DOSING of all meds including Advair500Bid; she refuses to  take Mucinex, refuses nasal sprays, refuses to sched w/ Gyn...  ~  July 18, 2015:  16mo ROV & Amanda [name] presents w/ recent incr SOB, cough, wheezing & chest tightness which she thinks is related to "weather changes and allergies";  Notes baseline DOE w/ "a bunch of stuff" like housework, taking out the trash, etc; we have discussed need for diet, exercise, wt reduction on mult occais but she has been unable to comply...    Asthma/ AB> on Advair500Bid, VentolinHFA prn (uses thisQid recently); she hasn't had Pred in a long time & stopped Spiriva & Singulair on her own; I have rec that she add INCRUSE one inhalation daily &  Pred20mg - tapering schedule...    HL> supposed to be on Simva40; [place] 10/16 shows TChol 219, TG 86, HDL 51, LDL 151 and a call to her Pharm indicates no recent refills of this med- we discussed taking the stain med every day, regularly, preferrably Qhs...    Hypothyroid> supposed to be on her Armour Thyroid 60-2tabs daily; LABS 07/18/15 showed TSH=10.51 and she is reminded to take it every day so we can determine her optimal dose...    Overweight>  Weight today is 188# up 2# & we reviewed diet/ exercise/ wt reduction...    Vit D defic>  Supposed to be on OTC Vit D supplement daily but she declines to take it regularly; LABS 07/2015 shows VitD level = 17 and asked to restart VitD 50K weekly dosing...  EXAM shows Afeb, VSS, O2sat=96% on RA;  HEENT- neg, mallampati2;  Chest- mild exp wheezing, scat rhonchi, no rales or consolidation;  Heart- RR w/o m/r/g;  Abd- soft, nontender, neg;  Ext- w/o c/c/e;  Neuro- intact...  LABS 10/17/14>  FLP- not at goals but she hasn't filled the Simva40 in several months;  Chems- wnl x BS=112;  CBC- wnl;  TSH=10.51;  VitD=16.6.Marland KitchenMarland Kitchen  IMP/PLAN>>  Amanda [name] has an AB exac & we will treat w/ Pred tapering sched but she needs to take her regular meds daily as maintenance, we discussed adding INCRUSE to her Advair;  Her LABS also reflect poor compliance w/ medications and she PROMISES to do better...  ~  January 16, 2016:  75mo ROV & Amanda [name] reports increased stress as her sister passed away in 2022-11-25 w/ lung cancer Amanda [name]- never smoked, treated in [place]);  She also notes that her asthma has been acting up lately and that her cat is ill- now required to live inside w/ resultant incr asthma symptoms as well; c/o nasal drainage, dry hacking cough, chest tight & wheezing, notes SOB/DOE w/ regular walking, mild cough & min beige sput; she states she had a weird reaction to Doxy prev & refuses to take this antibiotic again...     Allergies & cat now resides indoors>  We reviewed need for  antihist, Flonase, saline, and a good electrostatic air cleaner in her bedroom...    Asthma/ AB>  On Advair500Bid, ProairHFA- using it Bid, not taking the INCRUSE (she says "didn't have success w/ Spiriva or Incruse"), has Tessalon & Hycodan for prn use;       HL>  Says she is doing the simva40 daily now but not fasting today...    Hypothy>  She says she is taking the Armour thyroid60-2 Qam regularly; TSH today 01/16/16 = 0.10 indicating over-replaced when taking 120mg  every day- REC to decr to 60mg  daily...    Anxiety>  Her sister passed away, her cat is ill & living indoors now affecting  her allergies & asthma acting up; offered Alpraz or similar rx but she declines... EXAM shows Afeb, VSS, O2sat=95% on RA;  HEENT- neg, mallampati2;  Chest- mild exp wheezing, scat rhonchi, no rales or consolidation;  Heart- RR w/o m/r/g;  Abd- soft, nontender, neg;  Ext- w/o c/c/e;  Neuro- intact...  CXR 01/16/16>  Norm heart size, clear lungs w/ mild left basilar atx, DJD Tspine, NAD...   Spirometry 01/16/16>  FVC=1.88 (58%), FEV1=1.10 (43%), %1sec=58%, mid-flows reduced at 18% predicted;  This is c/o a mod obstructive ventilatory defect  And GOLD Stage3 COPD...]  LABS 01/16/16>  TSH= 0.10 on the Armour thyroid 60mg - 2/d... This is oversuppressed & needs to back off to 60mg /d. IMP/PLAN>>  Amanda [name] will not take any additional inhalers so we will Rx w/ Medrol 8mg  slow wean + her Advair500Bid & ProairHFA rescue;  She will re-double her efforts at allergy Rx w/ Antihist Qam, Flonase Bid, Saline as needed & get the electrostatic air cleaner for her bedroom;  Finally add vigorous antireflux regimen w/ Protonix40 before dinner, NPO after dinner (she refuses stating "I drink stuff"), Elev HOB 6"... Rov in ~6weeks recheck.  ~  March 19, 2016:  25mo ROV & Amanda [name] notes that her breathing is back to baseline- she notes that she has new insurance now [facility], [employer]) & I feel it would be helpful to re-submit request for [facility] for her  Asthma, she is in agreement as a prev shot seemed to help but her insurance copay was prohibitive in past... Her CC is nocturnal leg pain x several weeks- down left leg & she denies injury, no LBP, & the pain is NOT present when she is on Medrol, OTC Advil w/ sl benefit as well; she notes some swelling as well; we decided to try Etodolac400Bid first- plus low sodium/ elevation/ support hose & consider further eval if not responding...     Allergies & cat now resides indoors>  We reviewed need for Antihist, Flonase, Saline, and a good electrostatic air cleaner in her bedroom...    Asthma/ AB>  On Advair500Bid, Singulair10, ProairHFA- using it Bid, not taking the INCRUSE (she says "didn't have success w/ Spiriva or Incruse"), has Tessalon & Hycodan for prn use;       HL>  Says she is doing the Simva40 daily now but not fasting today...    Hypothy>  She says she is taking the Armour Thyroid60-2 Qam regularly; TSH 01/16/16 = 0.10 indicating over-replaced when taking 120mg  every day- REC to decr to 60mg  daily...    Anxiety>  Her sister passed away Oct 27, 2022- lung cancer), her cat is ill & living indoors now affecting her allergies & asthma acting up; offered Alpraz or similar rx but she declines... EXAM shows Afeb, VSS, O2sat=95% on RA;  HEENT- neg, mallampati2;  Chest- mild exp wheezing, scat rhonchi, no rales or consolidation;  Heart- RR w/o m/r/g;  Abd- soft, nontender, neg;  Ext- w/o c/c/e;  Neuro- intact... IMP/PLAN>>  We decided to try the Etodolac400bid before getting additional w/u for the leg pain; rec to elim sodium/ elev legs/ use support hose of the swelling; we will request her new insurance [employer] to approve XOLAIR for her severe asthma...         Problem List:  ALLERGIC RHINITIS (ICD-477.9)  ++allergy testing by DrESL in the 1980's w/ reactions to grasses, ragweed, dust, molds, cats, dog... prev on shots... ~  10/12:  She reports a new kitten at home, but denies allergy or  asthma exac so far... ~   10/13:  I cannot find documentation of IgE/ RAST testing (?done at [facility] in the past, she was in a drug trial 2006 at [facility]); we sent labs today> IgE=  68 & RAST pos for Cat> Dust> Dog> molds...  We decided to apply for [facility] Rx => they could not secure payment from her insurance... ~  10/14:  on Zyrtek, Singulair10, Advair500, AlbutHFA prn; breathing has been good, allergies acting up & she has dermatitis on face- wants Dosepak- ok ~  4/15: presents c/o spring allergy symptoms on Singulair10 & Zyrtek OTC, refuses nasal sprays; Rec Depo80, Pred dosepak, etc... ~  4/16: this spring hasn't been so bad, yet; she stopped the Singulair on her own, won't use any nasal sprays; advised to call if round of Pred needed...  ASTHMA (ICD-493.90) - on ADVAIR 500Bid, & VENTOLIN HFA as needed... she admits to using Advair once dailly & is encouraged to take meds regularly as perscribed; similarly she overuses the rescue inhaler & admonished to not use it more than Qid... ~  baseline CXR shows sl incr markings, NAD... ~  PFT's 5/05 showed FVC= 2.47 (76%), FEV1= 1.51 (56%), %1sec=61, mid-flows= 22%pred... ~  in 2006 she entered the EXTRA trial at [facility] which was a double blind trial testing XOLAIR... ~  last note from [facility] 6/07 DrCarraway w/ concern for her fixed airway obstruction... pt refused f/u in the [place]... ~  f/u PFT 10/11 showed FVC= 2.52 (71%), FEV1= 1.69 (60%), %1sec=67, mid-flows= 38%pred. ~  CXR 10/11 showed clear lungs, NAD... ~  10/13:  Treated for exac w/ Pred course; rechecked labs including IgE= 68 & RAST pos for Cat> Dust> Dog> molds... We decided to apply for [facility] Rx but they could not arrange payment from her insurance... She states that the 1st shot really helped... ~  10/14: on Zyrtek, Singulair10, Advair500, AlbutHFA prn; breathing has been good, allergies acting up & she has dermatitis on face- wants Dosepak- ok. ~  4/15: on Zyrtek10, Advair500Bid, Singulair10, ProairHFA prn (lately incr  to qid); she is an excellent Xolair cand but they couldn't get it worked out w/ her insurance; hasn't used any Pred in >48mo til this visit w/ spring pollen. ~  10/15: on same meds but only doing the Advair once/d & not regular w/ the Singulair; as a result she continues to use the rescue inhaled 1-2/wk she says; we discussed taking meds regularly... 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 prev on Zetia, now on SIMVASTATIN 40mg /d but admits to intermittent dosing. ~  4/11: pt presents w/ request to start writing Simva Rx & monitor labs since DrDoerr has left [place]... encouraged to take med regularly & follow low chol/ low fat diet... ~  FLP 10/11 on Simva40 showed TChol 198, TG 110, HDL 46, LDL 131 ~  FLP 10/12 on Simva40 showed TChol 172, TG 82, HDL 51, LDL 105... Improved, reminded of diet & compliance. ~  FLP 10/13 on Simva40 (not taking daily) showed TChol 229, TG 60, HDL 48, LDL 170==>  rec to take med daily & refer to [facility]! ~  FLP 10/14 on Simva40 showed TChol 138, TG 78, HDL 42, LDL 81... Great job, continue daily dosing... ~  [place] 10/15 on Simva40 (?compliance) showed TChol 205, TG 72, HDL 42, LDL 149... Reminded to take it everyday, low fat diet & get the wt down. ~  4/16: reminded to take med every eve + diet/ exercise/ wt reduction...  HYPOTHYROIDISM (ICD-244.9) - taking ARMOUR THYROID 60mg - 2tabs daily... she was followed by DrDoerr, Endocrine in [place] for thyroid and her hyperlipidemia... she was prev on Levothyroid and had requested change  to Armour Thyroid because she believed the Levothy was making her hair fall out... ~  4/11:  pt reqested that we write her Rx for the Armour Thyroid 60mg  since DrDoerr has left [place]... pt is uncouraged to take the med daily. ~  labs 10/11 showed TSH= 4.69 ~  Labs 10/12 on ArmourThyroid60-2/d showed TSH= 2.88... rec to continue same. ~  Labs 10/13 on ArmourThyroid60-2/d (not taking daily) showed TSH= 5.91==> rec to take med every day!!! ~  Labs 10/14 on ArmourThyroid60-2/d showed TSH=0.43 ~  Labs 10/15 on ArmourThyroid60-2/d showed TSH=6.70 and reminded to take all meds every day...  OVERWEIGHT (ICD-278.02) - we reviewed diet + exercise program required to lose weight... ~  weight 9/10 = 200#,  she is 5\' 5"  tall,  BMI= 34. ~  weight 4/11 = 192#,  this is as good as she's been in several yrs. 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 she continues to treat her reflux as needed w/ the Nexium and OTC Zantac even though we have felt this plays a signif roll in her Asthma... the consultants at [facility] agreed, but were no more effective at getting her to take her meds regularly... 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 ~  meds re-written & encouraged to take PROTONIX 40mg /d- 30 min before the 1st meal, and Zantac 75- 1-2 at bedtime... ~  4/14: she requests switch to NEXIUM40 from the Protonix...  GYN = DrRomaine in the past... pt states that she stopped going because she couldn't help her w/ her hot flashes. ~  4/14: reminded of the importance of regular Gyn checks- pelvic, Pap, Mammograms, etc...  OSTEOARTHRITIS (ICD-715.90) - she had a left  knee arthroscopy by DrPaul in 1999...  VITAMIN D DEFICIENCY (ICD-268.9) - pt was on Vit D 50,000 u weekly from DrDoerr... we don't have prev labs from her but pt indicates that the Vit D level was "really really low" so we will refill the Rx & f/u Vit D level later. ~  labs 10/11 showed Vit D level = 44... OK to switch to 1-2000u daily OTC supplement. ~  4/12:  Pt requested to go back on Vit D 50000u weekly... ~  10/12:  Pt indicated that she stopped filling the Rx for Vit D 50K weekly, ?why? ~  BMD 5/14 was wnl w/ lowest Tscore = -0.2.Marland KitchenMarland Kitchen ~  10/14: she stopped the VitD 50K weekly  Rx on her own; Labs 10/14 showed VitD level = 31 & she is requested to take 2000u OTC supplement daily  ~  Labs 10/15 showed VitD level = 22 and she is rec to take OTC VitD supplement ~5000u daily... ~  Pt reminded to take all meds regularly + OTC VitD supplements...  ANXIETY (ICD-300.00)  Hx of SHINGLES (ICD-053.9) - she presented w/ cryptic left flank pain 12/09 that turned out to be Shingles... treated w/ Valtrex, Pred, Vicodin.  HEALTH MAINTENANCE:   ~  GI:  She has not established w/ a GI for routine screening colonoscopy- reminded again about this important screening procedure! ~  GYN:  GYN = DrRomaine in the past... pt states that she stopped going because she couldn't help her w/ her hot flashes; she is reminded of the need to establish w/ another GYN for PAP, Mammograms, BMD etc... ~  Immuniz:  she refuses Pneumovax & Flu vaccine... She received TDAP 2012 prior to a trip to [place].   Past Surgical History  Procedure Laterality Date  . Left knee arthroscopy  1999    by Dr. Eddie Dibbles  . Breast reductions  2000    in [place]    Outpatient Encounter Prescriptions as of 03/19/2016  Medication Sig  . albuterol (PROVENTIL HFA;VENTOLIN HFA) 108 (90 BASE) MCG/ACT inhaler Inhale 2 puffs into the lungs every 6 (six) hours as needed for wheezing.  Marland Kitchen aspirin 81 MG tablet Take 81 mg by mouth daily.   . cetirizine  (ZYRTEC) 10 MG tablet Take 10 mg by mouth daily as needed.  . cholecalciferol (VITAMIN D) 1000 units tablet Take 1,000 Units by mouth daily.  . Fluticasone-Salmeterol (ADVAIR DISKUS) 500-50 MCG/DOSE AEPB Inhale one puff by mouth every twelve hours  . montelukast (SINGULAIR) 10 MG tablet Take 1 tablet (10 mg total) by mouth at bedtime.  . pantoprazole (PROTONIX) 40 MG tablet Take 1 tablet (40 mg total) by mouth 2 (two) times daily.  . Pseudoephedrine-Ibuprofen (ADVIL COLD & SINUS LIQUI-GELS) 30-200 MG CAPS As needed  . ranitidine (ZANTAC) 75 MG tablet Take 1 tablet (75 mg total) by mouth at bedtime as needed.  . simvastatin (ZOCOR) 40 MG tablet Take 1 tablet (40 mg total) by mouth at bedtime.  Marland Kitchen thyroid (ARMOUR) 60 MG tablet Take 2 tablets by mouth once daily  . etodolac (LODINE) 400 MG tablet Take 1 tablet (400 mg total) by mouth 2 (two) times daily.  Marland Kitchen HYDROcodone-homatropine (HYCODAN) 5-1.5 MG/5ML syrup Take 5 mLs by mouth every 6 (six) hours as needed for cough. (Patient not taking: Reported on 03/19/2016)  . LORazepam (ATIVAN) 1 MG tablet 1/2 - 1 tablet three times daily as needed for nerves (Patient not taking: Reported on 03/19/2016)  . Tiotropium Bromide Monohydrate (SPIRIVA RESPIMAT) 2.5 MCG/ACT AERS Inhale 2 puffs into the lungs daily. (Patient not taking: Reported on 03/19/2016)  . Umeclidinium Bromide (INCRUSE ELLIPTA) 62.5 MCG/INH AEPB Inhale 1 puff into the lungs daily. (Patient not taking: Reported on 03/19/2016)  . [DISCONTINUED] methylPREDNISolone (MEDROL) 8 MG tablet Take as directed by Dr Lenna Gilford (Patient not taking: Reported on 03/19/2016)  . [DISCONTINUED] predniSONE (DELTASONE) 20 MG tablet Use as directed (Patient not taking: Reported on 03/19/2016)   No facility-administered encounter medications on file as of 03/19/2016.    Allergies  Allergen Reactions  . Azithromycin     zpak does not work for the pt    Current Medications, Allergies, Past Medical History, Past Surgical  History, Family History, and Social  History were reviewed in [place] record.    Review of Systems       See HPI - all other systems neg except as noted...      The patient complains of dyspnea on exertion.  The patient denies anorexia, fever, weight loss, weight gain, vision loss, decreased hearing, hoarseness, chest pain, syncope, peripheral edema, prolonged cough, headaches, hemoptysis, abdominal pain, melena, hematochezia, severe indigestion/heartburn, hematuria, incontinence, muscle weakness, suspicious skin lesions, transient blindness, difficulty walking, depression, unusual weight change, abnormal bleeding, enlarged lymph nodes, and angioedema.     Objective:   Physical Exam      WD, overweight, 60 y/o WF in NAD... GENERAL:  Alert & oriented; & cooperative... HEENT:  [place]/AT, EOM-wnl, PERRLA, Fundi-benign, EACs-clear, TMs-wnl, NOSE-clear, THROAT-clear & wnl. NECK:  Supple w/ full ROM; no JVD; normal carotid impulses w/o bruits; no thyromegaly or nodules palpated; no lymphadenopathy. CHEST:  Clear to P & A; decr BS bilat; without wheezes/ rales/ or rhonchi heard... HEART:  Regular Rhythm; without murmurs/ rubs/ or gallops detected... ABDOMEN:  Soft & nontender; normal bowel sounds; no organomegaly or masses palpated... EXT: without deformities, mild arthritic changes; no varicose veins/ venous insuffic/ or edema. NEURO:  CN's intact; no focal neuro deficits... DERM:  No lesions noted; no rash etc...  RADIOLOGY DATA:  Reviewed in the EPIC EMR & discussed w/ the patient...  LABORATORY DATA:  Reviewed in the EPIC EMR & discussed w/ the patient...   Assessment & Plan:    ASTHMA>  Asked to use the Advair Bid regularly & gauge control by NOT having to use the rescue inhaler... IgE= 68 w/ pos RAST to Cats, Dust, Dog, some molds & we tried for Xolair treatment but unfortunately this could not be arranged due to her insurance...  4/14> try Singulair=> she wants  to continue this rx... 4/15> springtime exac due to pollen & treated w/ Depo80, dosepak, Hycodan... 10/15> reminded to take all regular meds REGULARLY! 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 10/16> she presents w/ an exac, only taking Advair Bid & VentolinHFA prn, she stopped the Spiriva & ?if using the Advair every day; we reviewed the need for daily meds- try Advair500Bid & Incruse daily, plus Rx Pred taper... 4/17> still not controlled & Rx hampered by medication compliance; we Rx w/ Medrol taper + her Advair500 & ProairHFA, needs allergen management & antireflux regimen... 6/17> she feels she is back to baseline; has new [facility] insurance & we will try for [facility]...  HYPERLIPID>  On Simva40 & asked to take it nightly to gauge it's effectiveness; FLP improved w/ more regular dosing; Needs better diet & wt loss...  HYPOTHYROID>  On Armor Thyroid which she insists upon (prev from DrDoerr); TSH looks good when she takes it everyday; Clinically euthyroid, Continue same...  HH/ REFLUX>  Asked to be diligent w/ antireflux measures including the Protonix40Bid & ZantacQhs if nec...  Vit D Deif>  she stopped the 50K treatment on her own; VitD level 10/15= 22 & asked to take 5000u daily OTC supplement...  OVERWEIGHT>  We reviewed diet + exercise needed to lose wt...  Other medical issues as noted...   Patient's Medications  New Prescriptions   ETODOLAC (LODINE) 400 MG TABLET    Take 1 tablet (400 mg total) by mouth 2 (two) times daily.  Previous Medications   ALBUTEROL (  PROVENTIL HFA;VENTOLIN HFA) 108 (90 BASE) MCG/ACT INHALER    Inhale 2 puffs into the lungs every 6 (six) hours as needed for wheezing.   ASPIRIN 81 MG TABLET    Take 81 mg by mouth daily.    CETIRIZINE (ZYRTEC) 10 MG TABLET    Take 10 mg by mouth daily as needed.   CHOLECALCIFEROL (VITAMIN D)  1000 UNITS TABLET    Take 1,000 Units by mouth daily.   FLUTICASONE-SALMETEROL (ADVAIR DISKUS) 500-50 MCG/DOSE AEPB    Inhale one puff by mouth every twelve hours   HYDROCODONE-HOMATROPINE (HYCODAN) 5-1.5 MG/5ML SYRUP    Take 5 mLs by mouth every 6 (six) hours as needed for cough.   LORAZEPAM (ATIVAN) 1 MG TABLET    1/2 - 1 tablet three times daily as needed for nerves   MONTELUKAST (SINGULAIR) 10 MG TABLET    Take 1 tablet (10 mg total) by mouth at bedtime.   PANTOPRAZOLE (PROTONIX) 40 MG TABLET    Take 1 tablet (40 mg total) by mouth 2 (two) times daily.   PSEUDOEPHEDRINE-IBUPROFEN (ADVIL COLD & SINUS LIQUI-GELS) 30-200 MG CAPS    As needed   RANITIDINE (ZANTAC) 75 MG TABLET    Take 1 tablet (75 mg total) by mouth at bedtime as needed.   SIMVASTATIN (ZOCOR) 40 MG TABLET    Take 1 tablet (40 mg total) by mouth at bedtime.   THYROID (ARMOUR) 60 MG TABLET    Take 2 tablets by mouth once daily   TIOTROPIUM BROMIDE MONOHYDRATE (SPIRIVA RESPIMAT) 2.5 MCG/ACT AERS    Inhale 2 puffs into the lungs daily.   UMECLIDINIUM BROMIDE (INCRUSE ELLIPTA) 62.5 MCG/INH AEPB    Inhale 1 puff into the lungs daily.  Modified Medications   No medications on file  Discontinued Medications   METHYLPREDNISOLONE (MEDROL) 8 MG TABLET    Take as directed by Dr Lenna Gilford   PREDNISONE (DELTASONE) 20 MG TABLET    Use as directed

## 2016-03-19 NOTE — Patient Instructions (Signed)
Today we updated your med list in our EPIC system...    Continue your current medications the same...  We wrote a new prescription for an anti-inflamm pain med= ETODOLAC (Lodine) 400mg  take one tab twice daily w/ food as needed for arthritis pain...  We will petition your [facility] insurance regarding starting XOLAIR shots...    We will let you know what your cost might be from this insurance provider...  Remember-- NO SALT, Elevate legs, wear support hose (to minimize your swelling...  Call for any questions...  Let's plan a follow up visit in 85mo, sooner if needed for problems.Marland KitchenMarland Kitchen

## 2016-04-15 ENCOUNTER — Telehealth: Payer: Self-pay | Admitting: Pulmonary Disease

## 2016-04-15 NOTE — Telephone Encounter (Signed)
Called spoke with Mechele Claude. She states that they need the last three office notes faxed to [phone]. I explained to her that I would fax this today. She voiced understanding and had no further questions. OV notes have been faxed.

## 2016-04-16 ENCOUNTER — Telehealth: Payer: Self-pay | Admitting: Pulmonary Disease

## 2016-04-16 NOTE — Telephone Encounter (Signed)
Called and lmom for Amanda [name] to make her aware that the chart notes have been faxed and she will call if she does not receive them.

## 2016-04-17 NOTE — Telephone Encounter (Signed)
Amanda [name] with [facility] called stating she did receive the chart notes and will review the case.

## 2016-04-17 NOTE — Telephone Encounter (Signed)
lmtcb X2 for [employer]

## 2016-05-14 ENCOUNTER — Telehealth: Payer: Self-pay | Admitting: Pulmonary Disease

## 2016-05-14 NOTE — Telephone Encounter (Signed)
Called [employer], spoke with Cristie Hem, states that [facility] shipment is pending d/t the patient needing to speak to the insurance regarding her co-pay. Pt is supposed to call [facility] back once she decides what she wants to do. Cristie Hem states that they will call back next week with an update. Nothing further needed.

## 2016-05-15 ENCOUNTER — Other Ambulatory Visit: Payer: Self-pay | Admitting: Pulmonary Disease

## 2016-05-16 NOTE — Telephone Encounter (Signed)
#   vials:1 Ordered date:05/16/16 Shipping Date:05/16/16

## 2016-05-16 NOTE — Telephone Encounter (Signed)
lmomtcb x1 for pt 

## 2016-05-16 NOTE — Telephone Encounter (Signed)
Amanda Scott do you set this up with [facility]?  thanks

## 2016-05-16 NOTE — Telephone Encounter (Signed)
[facility] called this morning to set up shipment. I was literary getting ready  to call them and the ph. Rang. Her Arvid Right will be here tomorrow.

## 2016-05-17 MED ORDER — EPINEPHRINE 0.3 MG/0.3ML IJ SOAJ
0.3000 mg | Freq: Once | INTRAMUSCULAR | 0 refills | Status: DC
Start: 2016-05-17 — End: 2016-05-17

## 2016-05-17 NOTE — Telephone Encounter (Signed)
Spoke with pt. She has been scheduled for her first Xolair injection. Pt is aware of the Xolair process >> 2 hour wait, must have EpiPen. Rx will be sent in for EpiPen. A savings card for EpiPen has been left up front for the pt to pick up. Advised her that when she picked this up she needed to have a nurse go over how to use the EpiPen. She verbalized understanding.

## 2016-05-20 NOTE — Telephone Encounter (Signed)
#   Vials:1 Arrival Date:05/17/16 Vita Barley checked this in I wasn't here.(05/17/16) Lot [id number] Exp Date:12/2019 The order date was recorded in this note. It had been closed, it took me awhile to find it. Made an addendum, and posted  Delivery.

## 2016-05-22 ENCOUNTER — Ambulatory Visit (INDEPENDENT_AMBULATORY_CARE_PROVIDER_SITE_OTHER): Payer: 59

## 2016-05-22 DIAGNOSIS — J4540 Moderate persistent asthma, uncomplicated: Secondary | ICD-10-CM | POA: Diagnosis not present

## 2016-05-23 MED ORDER — OMALIZUMAB 150 MG ~~LOC~~ SOLR
150.0000 mg | SUBCUTANEOUS | Status: DC
  Administered 2016-05-22: 150 mg via SUBCUTANEOUS

## 2016-06-18 ENCOUNTER — Telehealth: Payer: Self-pay | Admitting: Pulmonary Disease

## 2016-06-20 NOTE — Telephone Encounter (Addendum)
#   vials:1 Ordered date:06/18/16 Shipping Date:06/19/16  Forgot to put order into epic.

## 2016-06-20 NOTE — Telephone Encounter (Signed)
#   Vials:1 Arrival Date:06/20/16 Lot [id number] Exp Date:3/21

## 2016-06-24 ENCOUNTER — Ambulatory Visit (INDEPENDENT_AMBULATORY_CARE_PROVIDER_SITE_OTHER): Payer: 59

## 2016-06-24 DIAGNOSIS — J4540 Moderate persistent asthma, uncomplicated: Secondary | ICD-10-CM

## 2016-06-24 MED ORDER — OMALIZUMAB 150 MG ~~LOC~~ SOLR
150.0000 mg | Freq: Once | SUBCUTANEOUS | Status: AC
  Administered 2016-06-24: 150 mg via SUBCUTANEOUS

## 2016-06-26 ENCOUNTER — Ambulatory Visit: Payer: 59

## 2016-07-12 ENCOUNTER — Telehealth: Payer: Self-pay | Admitting: Pulmonary Disease

## 2016-07-12 NOTE — Telephone Encounter (Addendum)
#   vials:1 Ordered date:07/12/16 Shipping Date:07/16/16

## 2016-07-17 NOTE — Telephone Encounter (Signed)
#   Vials:1 Arrival Date:07/17/16 Lot [id number] Exp Date:4/21

## 2016-07-25 ENCOUNTER — Encounter: Payer: Self-pay | Admitting: Pulmonary Disease

## 2016-07-25 ENCOUNTER — Other Ambulatory Visit (INDEPENDENT_AMBULATORY_CARE_PROVIDER_SITE_OTHER): Payer: 59

## 2016-07-25 ENCOUNTER — Ambulatory Visit (INDEPENDENT_AMBULATORY_CARE_PROVIDER_SITE_OTHER): Payer: 59

## 2016-07-25 ENCOUNTER — Ambulatory Visit (INDEPENDENT_AMBULATORY_CARE_PROVIDER_SITE_OTHER): Payer: 59 | Admitting: Pulmonary Disease

## 2016-07-25 VITALS — BP 128/78 | HR 69 | Temp 98.5°F | Ht 65.0 in | Wt 188.0 lb

## 2016-07-25 DIAGNOSIS — M8949 Other hypertrophic osteoarthropathy, multiple sites: Secondary | ICD-10-CM

## 2016-07-25 DIAGNOSIS — E784 Other hyperlipidemia: Secondary | ICD-10-CM

## 2016-07-25 DIAGNOSIS — J4540 Moderate persistent asthma, uncomplicated: Secondary | ICD-10-CM

## 2016-07-25 DIAGNOSIS — M150 Primary generalized (osteo)arthritis: Secondary | ICD-10-CM

## 2016-07-25 DIAGNOSIS — Z0000 Encounter for general adult medical examination without abnormal findings: Secondary | ICD-10-CM

## 2016-07-25 DIAGNOSIS — E7849 Other hyperlipidemia: Secondary | ICD-10-CM

## 2016-07-25 DIAGNOSIS — J302 Other seasonal allergic rhinitis: Secondary | ICD-10-CM

## 2016-07-25 DIAGNOSIS — I872 Venous insufficiency (chronic) (peripheral): Secondary | ICD-10-CM

## 2016-07-25 DIAGNOSIS — E559 Vitamin D deficiency, unspecified: Secondary | ICD-10-CM

## 2016-07-25 DIAGNOSIS — K219 Gastro-esophageal reflux disease without esophagitis: Secondary | ICD-10-CM | POA: Diagnosis not present

## 2016-07-25 DIAGNOSIS — E663 Overweight: Secondary | ICD-10-CM

## 2016-07-25 DIAGNOSIS — E039 Hypothyroidism, unspecified: Secondary | ICD-10-CM

## 2016-07-25 DIAGNOSIS — M159 Polyosteoarthritis, unspecified: Secondary | ICD-10-CM

## 2016-07-25 LAB — CBC WITH DIFFERENTIAL/PLATELET
Basophils Absolute: 0.1 10*3/uL (ref 0.0–0.1)
Basophils Relative: 0.7 % (ref 0.0–3.0)
Eosinophils Absolute: 0.6 10*3/uL (ref 0.0–0.7)
Eosinophils Relative: 8.5 % — ABNORMAL HIGH (ref 0.0–5.0)
HCT: 42.2 % (ref 36.0–46.0)
Hemoglobin: 14 g/dL (ref 12.0–15.0)
Lymphocytes Relative: 28.4 % (ref 12.0–46.0)
Lymphs Abs: 2.2 10*3/uL (ref 0.7–4.0)
MCHC: 33.2 g/dL (ref 30.0–36.0)
MCV: 88.9 fl (ref 78.0–100.0)
Monocytes Absolute: 0.6 10*3/uL (ref 0.1–1.0)
Monocytes Relative: 7.7 % (ref 3.0–12.0)
Neutro Abs: 4.2 10*3/uL (ref 1.4–7.7)
Neutrophils Relative %: 54.7 % (ref 43.0–77.0)
Platelets: 270 10*3/uL (ref 150.0–400.0)
RBC: 4.75 Mil/uL (ref 3.87–5.11)
RDW: 13.2 % (ref 11.5–15.5)
WBC: 7.6 10*3/uL (ref 4.0–10.5)

## 2016-07-25 LAB — COMPREHENSIVE METABOLIC PANEL
ALT: 15 U/L (ref 0–35)
AST: 12 U/L (ref 0–37)
Albumin: 4.3 g/dL (ref 3.5–5.2)
Alkaline Phosphatase: 92 U/L (ref 39–117)
BUN: 16 mg/dL (ref 6–23)
CO2: 33 mEq/L — ABNORMAL HIGH (ref 19–32)
Calcium: 9.3 mg/dL (ref 8.4–10.5)
Chloride: 104 mEq/L (ref 96–112)
Creatinine, Ser: 0.73 mg/dL (ref 0.40–1.20)
GFR: 86.52 mL/min (ref >60.00)
Glucose, Bld: 117 mg/dL — ABNORMAL HIGH (ref 70–99)
Potassium: 4.3 mEq/L (ref 3.5–5.1)
Sodium: 143 mEq/L (ref 135–145)
Total Bilirubin: 0.5 mg/dL (ref 0.2–1.2)
Total Protein: 6.7 g/dL (ref 6.0–8.3)

## 2016-07-25 LAB — LIPID PANEL
Cholesterol: 159 mg/dL (ref 0–200)
HDL: 51.4 mg/dL (ref >39.00)
LDL Cholesterol: 95 mg/dL (ref 0–99)
NonHDL: 107.6
Total CHOL/HDL Ratio: 3
Triglycerides: 64 mg/dL (ref 0.0–149.0)
VLDL: 12.8 mg/dL (ref 0.0–40.0)

## 2016-07-25 LAB — TSH: TSH: 0.54 u[IU]/mL (ref 0.35–4.50)

## 2016-07-25 LAB — VITAMIN D 25 HYDROXY (VIT D DEFICIENCY, FRACTURES): VITD: 32.25 ng/mL (ref 30.00–100.00)

## 2016-07-25 MED ORDER — SIMVASTATIN 40 MG PO TABS: 40.0000 mg | ORAL_TABLET | Freq: Every day | ORAL | 11 refills | Status: DC

## 2016-07-25 MED ORDER — THYROID 60 MG PO TABS: ORAL_TABLET | ORAL | 11 refills | Status: DC

## 2016-07-25 MED ORDER — OMALIZUMAB 150 MG ~~LOC~~ SOLR
150.0000 mg | SUBCUTANEOUS | Status: DC
  Administered 2016-07-25: 150 mg via SUBCUTANEOUS

## 2016-07-25 MED ORDER — FUROSEMIDE 20 MG PO TABS: 20.0000 mg | ORAL_TABLET | Freq: Every day | ORAL | 11 refills | Status: DC | PRN

## 2016-07-25 MED ORDER — ALBUTEROL SULFATE HFA 108 (90 BASE) MCG/ACT IN AERS: 2.0000 | INHALATION_SPRAY | Freq: Four times a day (QID) | RESPIRATORY_TRACT | 11 refills | Status: DC | PRN

## 2016-07-25 MED ORDER — FLUTICASONE-SALMETEROL 500-50 MCG/DOSE IN AEPB: INHALATION_SPRAY | RESPIRATORY_TRACT | 11 refills | Status: DC

## 2016-07-25 MED ORDER — PANTOPRAZOLE SODIUM 40 MG PO TBEC: 40.0000 mg | DELAYED_RELEASE_TABLET | Freq: Every day | ORAL | 11 refills | Status: DC

## 2016-07-25 MED ORDER — MONTELUKAST SODIUM 10 MG PO TABS: 10.0000 mg | ORAL_TABLET | Freq: Every day | ORAL | 11 refills | Status: DC

## 2016-07-25 NOTE — Patient Instructions (Signed)
Today we updated your med list in our EPIC system...    Continue your current medications the same...  We refilled your meds per request...  Today we checked your FASTING blood work...    We will contact you w/ the results when available...   Keep up the good work w/ DIET & EXERCISE...  Call for any questions...  Let's plan a follow up visit in 18mo, sooner if needed for problems.Marland KitchenMarland Kitchen

## 2016-07-25 NOTE — Progress Notes (Signed)
Subjective:    Patient ID: Amanda [name], female    DOB: [birth year], 60 y.o.   MRN: [id number]  HPI 60 y/o WF here for a follow up visit... she has multiple medical problems as noted below... she was prev followed by DrWelty-Wolfe at [place] for her Asthma, then switched to [facility], but both of them have left... I tried to get her to see Dr. Yates Decamp but pt has refused further referrals for her Asthma... the concern has been for the developement of fixed airway obstruction from her Asthma due to poor control and medication non-compliance in the past...  ~  SEE PREV EPIC NOTES FOR OLDER DATA >>    LABS 10/14:  FLP- at goals on Simva40;  Chems- ok x BS=118;  CBC- wnl;  TSH=0.43;  VitD=31 & rec to take 2000u daily...  LABS 10/15:  FLP- not at goal on Simva40 (?compliance);  Chems- ok w/ BS=108;  CBC- wnl x 8%eos;  TSH=6.70 & reminded to take her ArmourThyroid everyday;  VitD=22 & rec OTC VitD5000u daily...  ~  January 13, 2015:  39mo ROV & Amanda [name] reports breathing at baseline but seasonal allergies are kicking in; she also c/o some LBP, thinks it's her bed, offered XRays vs Ortho but she declines, advised heat/ back brace/ Tylenol etc... We reviewed the following medical problems during today's office visit >>     AR, Asthma> on Advair500Bid, ProairHFA prn, & Zyrtek10 (she stopped the Singulair on her own); she is an excellent Xolair cand but they couldn'Amanda get it worked out w/ her insurance (copay too $$); hasn'Amanda used any Pred in ?58yr...    Her medical management has been repeatedly hampered over the years by medication non-compliance...     Hyperlipid> on Simva40; last FLP 10/15 showed TChol 205, TG 72, HDL 42, LDL 149=> not as good as prev & I suspect compliance issues- asked to take it every eve, + better diet/ exerc/ wt reduction...    Hypothyroid> on ArmourThyroid60-2 tabs/d & she refuses Synthroid Rx; TSH 10/15 = 6.70 & she is clinically euthyroid, again reminded of the need for  100% compliance w/ dosing...    Overweight> wt down to 186# & we reviewed diet, exercise, wt reduction strategies...    HH, Reflux> on Protonix40Bid + Zantac75 as needed; she knows to use max antireflux regimen w/ elev HOB, NPO after dinner, etc...    GYN> prev seen by [facility] but she stopped going when they didn'Amanda help her hot flashes; she is again advised to estab w/ another GYN for Pelvic, Pap, Mammography, BMD, etc (she still has not complied w/ this request)...     DJD> hx left knee pain & arthroscopy 1999 by DrPaul; she uses OTC analgesics as needed...    VitD defic> intermittently on VitD50K weekly from [place]; stopped on her own- took OTC VitD supplement but she stopped that too; last VitD level 10/14 = 31 & asked to resume daily vit supplements (never did- asked again)...    Anxiety> offered Alpraz or similar Benzo in past but she declines med rx... 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 PLAN>>  We discussed adding Spiriva Respimat- 2sp daily due to her Spirometry report; continue REGULAR DOSING of all meds including Advair500Bid; she refuses to  take Mucinex, refuses nasal sprays, refuses to sched w/ Gyn...  ~  July 18, 2015:  16mo ROV & Amanda [name] presents w/ recent incr SOB, cough, wheezing & chest tightness which she thinks is related to "weather changes and allergies";  Notes baseline DOE w/ "a bunch of stuff" like housework, taking out the trash, etc; we have discussed need for diet, exercise, wt reduction on mult occais but she has been unable to comply...    Asthma/ AB> on Advair500Bid, VentolinHFA prn (uses thisQid recently); she hasn'Amanda had Pred in a long time & stopped Spiriva & Singulair on her own; I have rec that she add INCRUSE one inhalation daily &  Pred20mg - tapering schedule...    HL> supposed to be on Simva40; [place] 10/16 shows TChol 219, TG 86, HDL 51, LDL 151 and a call to her Pharm indicates no recent refills of this med- we discussed taking the stain med every day, regularly, preferrably Qhs...    Hypothyroid> supposed to be on her Armour Thyroid 60-2tabs daily; LABS 07/18/15 showed TSH=10.51 and she is reminded to take it every day so we can determine her optimal dose...    Overweight>  Weight today is 188# up 2# & we reviewed diet/ exercise/ wt reduction...    Vit D defic>  Supposed to be on OTC Vit D supplement daily but she declines to take it regularly; LABS 07/2015 shows VitD level = 17 and asked to restart VitD 50K weekly dosing...  EXAM shows Afeb, VSS, O2sat=96% on RA;  HEENT- neg, mallampati2;  Chest- mild exp wheezing, scat rhonchi, no rales or consolidation;  Heart- RR w/o m/r/g;  Abd- soft, nontender, neg;  Ext- w/o c/c/e;  Neuro- intact...  LABS 10/17/14>  FLP- not at goals but she hasn'Amanda filled the Simva40 in several months;  Chems- wnl x BS=112;  CBC- wnl;  TSH=10.51;  VitD=16.6.Marland KitchenMarland Kitchen  IMP/PLAN>>  Amanda [name] has an AB exac & we will treat w/ Pred tapering sched but she needs to take her regular meds daily as maintenance, we discussed adding INCRUSE to her Advair;  Her LABS also reflect poor compliance w/ medications and she PROMISES to do better...  ~  January 16, 2016:  75mo ROV & Amanda [name] reports increased stress as her sister passed away in 2022-11-25 w/ lung cancer [name]- never smoked, treated in [place]);  She also notes that her asthma has been acting up lately and that her cat is ill- now required to live inside w/ resultant incr asthma symptoms as well; c/o nasal drainage, dry hacking cough, chest tight & wheezing, notes SOB/DOE w/ regular walking, mild cough & min beige sput; she states she had a weird reaction to Doxy prev & refuses to take this antibiotic again...     Allergies & cat now resides indoors>  We reviewed need for  antihist, Flonase, saline, and a good electrostatic air cleaner in her bedroom...    Asthma/ AB>  On Advair500Bid, ProairHFA- using it Bid, not taking the INCRUSE (she says "didn'Amanda have success w/ Spiriva or Incruse"), has Tessalon & Hycodan for prn use;       HL>  Says she is doing the simva40 daily now but not fasting today...    Hypothy>  She says she is taking the Armour thyroid60-2 Qam regularly; TSH today 01/16/16 = 0.10 indicating over-replaced when taking 120mg  every day- REC to decr to 60mg  daily...    Anxiety>  Her sister passed away, her cat is ill & living indoors now affecting  her allergies & asthma acting up; offered Alpraz or similar rx but she declines... EXAM shows Afeb, VSS, O2sat=95% on RA;  HEENT- neg, mallampati2;  Chest- mild exp wheezing, scat rhonchi, no rales or consolidation;  Heart- RR w/o m/r/g;  Abd- soft, nontender, neg;  Ext- w/o c/c/e;  Neuro- intact...  CXR 01/16/16>  Norm heart size, clear lungs w/ mild left basilar atx, DJD Tspine, NAD...   Spirometry 01/16/16>  FVC=1.88 (58%), FEV1=1.10 (43%), %1sec=58%, mid-flows reduced at 18% predicted;  This is c/o a mod obstructive ventilatory defect  And GOLD Stage3 COPD...]  LABS 01/16/16>  TSH= 0.10 on the Armour thyroid 60mg - 2/d... This is oversuppressed & needs to back off to 60mg /d. IMP/PLAN>>  Amanda [name] will not take any additional inhalers so we will Rx w/ Medrol 8mg  slow wean + her Advair500Bid & ProairHFA rescue;  She will re-double her efforts at allergy Rx w/ Antihist Qam, Flonase Bid, Saline as needed & get the electrostatic air cleaner for her bedroom;  Finally add vigorous antireflux regimen w/ Protonix40 before dinner, NPO after dinner (she refuses stating "I drink stuff"), Elev HOB 6"... Rov in ~6weeks recheck.  ~  March 19, 2016:  27mo ROV & Amanda [name] notes that her breathing is back to baseline- she notes that she has new insurance now [name] [employer]) & I feel it would be helpful to re-submit request for [facility] for her  Asthma, she is in agreement as a prev shot seemed to help but her insurance copay was prohibitive in past... Her CC is nocturnal leg pain x several weeks- down left leg & she denies injury, no LBP, & the pain is NOT present when she is on Medrol, OTC Advil w/ sl benefit as well; she notes some swelling as well; we decided to try Etodolac400Bid first- plus low sodium/ elevation/ support hose & consider further eval if not responding...     Allergies & cat now resides indoors>  We reviewed need for Antihist, Flonase, Saline, and a good electrostatic air cleaner in her bedroom...    Asthma/ AB>  On Advair500Bid, Singulair10, ProairHFA- using it Bid, not taking the INCRUSE (she says "didn'Amanda have success w/ Spiriva or Incruse"), has Tessalon & Hycodan for prn use;       HL>  Says she is doing the Simva40 daily now but not fasting today...    Hypothy>  She says she is taking the Armour Thyroid60-2 Qam regularly; TSH 01/16/16 = 0.10 indicating over-replaced when taking 120mg  every day- REC to decr to 60mg  daily...    Anxiety>  Her sister passed away 19-Nov-2022- lung cancer), her cat is ill & living indoors now affecting her allergies & asthma acting up; offered Alpraz or similar rx but she declines... EXAM shows Afeb, VSS, O2sat=95% on RA;  HEENT- neg, mallampati2;  Chest- mild exp wheezing, scat rhonchi, no rales or consolidation;  Heart- RR w/o m/r/g;  Abd- soft, nontender, neg;  Ext- w/o c/c/e;  Neuro- intact... IMP/PLAN>>  We decided to try the Etodolac400bid before getting additional w/u for the leg pain; rec to elim sodium/ elev legs/ use support hose of the swelling; we will request her new insurance [employer] to approve XOLAIR for her severe asthma...  ~  July 25, 2016:  45mo ROV & Amanda [name] is here for a pulmonary recheck & general follow up visit>  During her last visit we prescribed Lodine400 to try for her leg pain before considering further eval- pt tells me that the Pharm hand-out said this  drug could cause  cancer so she never filled the Rx & didn'Amanda call to discuss or pick an alternative; she fell in June & injured her right ankle- chose to see a podiatrist s/p 4 visits and eventually given Lasix20 prn swelling, states her pain is pretty much gone now... we reviewed the following medical problems during today's office visit >>     Her medical management has been repeatedly hampered over the years by medication non-compliance...     AR> on OTC antihist prn, Singulair10/d; prev RAST testing 10/13 showed IgE=68, Rast panel w/ CAT>>dust>mold>dog; she has cats and asked to exclude them from the bedroom & set up air cleaner etc but she has not done this.    AR, Asthma> on Advair500Bid (but only taking once per day), Singulair10, ProairHFA prn (hasn'Amanda needed); she started XOLAIR in Aug2017- monthly injections; she hasn'Amanda used any Pred in ?68yr; unfortunately her PFTs showed mod airflow obstruction which I believe is fixed due to her poorly controlled asthma x many yrs; she has refused adding an anticholinergic like Spiriva or Incruse to her regimen...    LPR> on Protonix40 (declined to incr to Bid), Zantac Qhs (not taking); she was asked to elev [name] & NPO after dinner but she has not complied w/ these requests either...    Hyperlipid> on Simva40; FLP 10/17 shows TChol 159, TG 64, HDL 51, LDL 95=> looks great w/ better Simva40 compliance + better diet/ exerc/ wt reduction...    Hypothyroid> she was on ArmourThyroid60-2 tabs/d & she refuses Synthroid Rx; TSH 4/17 = 0.10 & we rec decr to one 60gr tab daily; f/u TSH 10/17 on 60gr/d shows TSH= 0.54, continue same.    Overweight> wt stable at 188# & BMI=31-2; we reviewed diet, exercise, wt reduction strategies...    HH, Reflux> on Protonix40 + Zantac75 as needed; she knows to use max antireflux regimen w/ elev HOB, NPO after dinner, etc...    GYN> prev seen by [facility] but she stopped going when they didn'Amanda help her hot flashes; she is again advised to estab w/ another GYN  for Pelvic, Pap, Mammography, BMD, etc (she still has not complied w/ this request)...     DJD, ven insuffic> hx left knee pain & arthroscopy 1999 by DrPaul; she uses OTC analgesics as needed; mild VI & rec to elim salt, elev legs, wear support hose; she has Lasix20 prn edema...    VitD defic> intermittently on VitD50K weekly from [place]; stopped on her own- took OTC VitD supplement but she stopped that too; VitD level 10/17 = 32 & asked to resume daily vit supplements...    Anxiety> offered Alpraz or similar Benzo in past but she declines med rx... EXAM shows Afeb, VSS, O2sat=97% on RA;  HEENT- neg, mallampati2;  Chest- decr BS bilat but clear w/o w/r/r;  Heart- RR w/o m/r/g;  Abd- soft, min epig tender, neg;  Ext- w/o c/c/e;  Neuro- intact...  LABS 07/25/18>  FLP- all parameters at goals on Simva40;  Chems- ok x FBS=117 & we discussed diet/ wt reduction;  CBC- wnl x 8% eos;  TSH=0.54;  VitD=32... IMP/PLAN>> Kollyns's medical issues are improved w/ better med compliance & it is hoped that her asthma will improve as well using her inhalers + Xolair (if not reponding then she'd be a cand for Nucala w/ incr eos);  Continue current rx & the ArmourThyroid60 + add VitD3 ~2000u daily... Note FBS at 117 & she must get on a low  carb/ no seets diet & get wt down to avoid DM meds in her future...         Problem List:  ALLERGIC RHINITIS (ICD-477.9)  ++allergy testing by DrESL in the 1980's w/ reactions to grasses, ragweed, dust, molds, cats, dog... prev on shots... ~  10/12:  She reports a new kitten at home, but denies allergy or asthma exac so far... ~  10/13:  I cannot find documentation of IgE/ RAST testing (?done at [name] in the past, she was in a drug trial 2006 at [name]); we sent labs today> IgE=  68 & RAST pos for Cat> Dust> Dog> molds...  We decided to apply for [facility] => they could not secure payment from her insurance... ~  10/14:  on Zyrtek, Singulair10, Advair500, AlbutHFA prn;  breathing has been good, allergies acting up & she has dermatitis on face- wants Dosepak- ok ~  4/15: presents c/o spring allergy symptoms on Singulair10 & Zyrtek OTC, refuses nasal sprays; Rec Depo80, Pred dosepak, etc... ~  4/16: this spring hasn'Amanda been so bad, yet; she stopped the Singulair on her own, won'Amanda use any nasal sprays; advised to call if round of Pred needed... ~  4/17: on OTC antihist prn, Singulair10/d; prev RAST testing 10/13 showed IgE=68, Rast panel w/ CAT>>dust>mold>dog; she has cats and asked to exclude them from the bedroom & set up air cleaner etc but she has not done this.  ASTHMA (ICD-493.90) - on ADVAIR 500Bid, & VENTOLIN HFA as needed... she admits to using Advair once dailly & is encouraged to take meds regularly as perscribed; similarly she overuses the rescue inhaler & admonished to not use it more than Qid... ~  baseline CXR shows sl incr markings, NAD... ~  PFT's 5/05 showed FVC= 2.47 (76%), FEV1= 1.51 (56%), %1sec=61, mid-flows= 22%pred... ~  in 2006 she entered the EXTRA trial at [facility] which was a double blind trial testing XOLAIR... ~  last note from [facility] 6/07 DrCarraway w/ concern for her fixed airway obstruction... pt refused f/u in the [place]... ~  f/u PFT 10/11 showed FVC= 2.52 (71%), FEV1= 1.69 (60%), %1sec=67, mid-flows= 38%pred. ~  CXR 10/11 showed clear lungs, NAD... ~  10/13:  Treated for exac w/ Pred course; rechecked labs including IgE= 68 & RAST pos for Cat> Dust> Dog> molds... We decided to apply for [facility] but they could not arrange payment from her insurance... She states that the 1st shot really helped... ~  10/14: on Zyrtek, Singulair10, Advair500, AlbutHFA prn; breathing has been good, allergies acting up & she has dermatitis on face- wants Dosepak- ok. ~  4/15: on Zyrtek10, Advair500Bid, Singulair10, ProairHFA prn (lately incr to qid); she is an excellent Xolair cand but they couldn'Amanda get it worked out w/ her insurance; hasn'Amanda used any Pred  in >17mo til this visit w/ spring pollen. ~  10/15: on same meds but only doing the Advair once/d & not regular w/ the Singulair; as a result she continues to use the rescue inhaled 1-2/wk she says; we discussed taking meds regularly... 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 ~  05/2016> she has restarted the Xolair due to better coverage from her Amanda [name] insurance...  HYPERLIPIDEMIA (ICD-272.4) - prev followed by DrDoerr, Endocrinology in [place]... prev on Zetia, now on SIMVASTATIN 40mg /d but admits to intermittent dosing. ~  4/11: pt presents w/ request to start writing Simva Rx & monitor labs since DrDoerr has left [place]... encouraged to take med regularly & follow low chol/ low fat diet... ~  FLP 10/11 on Simva40 showed TChol 198, TG 110, HDL 46, LDL 131 ~  FLP 10/12 on Simva40 showed TChol 172, TG 82, HDL 51, LDL 105... Improved, reminded of diet & compliance. ~  FLP 10/13 on Simva40 (not taking daily) showed TChol 229, TG 60, HDL 48, LDL 170==> rec to take med daily & refer to [facility]! ~  FLP 10/14 on Simva40 showed TChol 138, TG 78, HDL 42, LDL 81... Great job, continue daily dosing... ~  [place] 10/15 on Simva40 (?compliance) showed TChol 205, TG 72, HDL 42, LDL 149... Reminded to take it everyday, low fat diet & get the wt down. ~  FLP 10/16 on Simva40 ?compliance showed TChol 219, TG 86, HDL 51, LDL 151... Discussed  taking it everyday! ~  [place] 10/17 on simva40 w/ better compliance sowed TChol 159, TG 64, HDL 51, LDL 95 & this is much improved...  HYPOTHYROIDISM (ICD-244.9) - taking ARMOUR THYROID 60mg - 2tabs daily... she was followed by DrDoerr, Endocrine in [place] for thyroid and her hyperlipidemia... she was prev on Levothyroid and had requested change to Armour Thyroid because she believed the Levothy was making her hair fall out... ~  4/11:  pt reqested that we write her Rx for the Armour Thyroid 60mg  since DrDoerr has left [place]... pt is uncouraged to take the med daily. ~  labs 10/11 showed TSH= 4.69 ~  Labs 10/12 on ArmourThyroid60-2/d showed TSH= 2.88... rec to continue same. ~  Labs 10/13 on ArmourThyroid60-2/d (not taking daily) showed TSH= 5.91==> rec to take med every day!!! ~  Labs 10/14 on ArmourThyroid60-2/d showed TSH=0.43 ~  Labs 10/15 on ArmourThyroid60-2/d showed TSH=6.70 and reminded to take all meds every day... ~  Labs 10/16 on Armour Thyroid60-2/d showed TSH=10.51 and we again discussed taking the med every day, 1st thing in AM etc... ~  Labs 4/17 on Armour Thyroid60-2/d & she assures me taking it regularly showed TSH=0.10! Therefore decr to one tab per day... ~  Labs 10/17 on Armour thyroid60/d showed TSH= 0.54 & rec to continue the same...  OVERWEIGHT (ICD-278.02) - we reviewed diet + exercise program required to lose weight... ~  weight 9/10 = 200#,  she is 5\' 5"  tall,  BMI= 34. ~  weight 4/11 = 192#,  this is as good as she's been in several yrs. 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 she continues to treat her reflux as needed w/ the Nexium  and OTC Zantac even  though we have felt this plays a signif roll in her Asthma... the consultants at [name] agreed, but were no more effective at getting her to take her meds regularly... 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 ~  meds re-written & encouraged to take PROTONIX 40mg /d- 30 min before the 1st meal, and Zantac 75- 1-2 at bedtime... ~  4/14: she requests switch to NEXIUM40 from the Protonix... ~  She has been poorly compliant w/ her antireflux regimen...  GYN = DrRomaine in the past... pt states that she stopped going because she couldn'Amanda help her w/ her hot flashes. ~  4/14: reminded of the importance of regular Gyn checks- pelvic, Pap, Mammograms, etc...  OSTEOARTHRITIS (ICD-715.90) - she had a left knee arthroscopy by DrPaul in 1999...  VITAMIN D DEFICIENCY (ICD-268.9) - pt was on Vit D 50,000 u weekly from DrDoerr... we don'Amanda have prev labs from her but pt indicates that the Vit D level was "really really low" so we will refill the Rx & f/u Vit D level later. ~  labs 10/11 showed Vit D level = 44... OK to switch to 1-2000u daily OTC supplement. ~  4/12:  Pt requested to go back on Vit D 50000u weekly... ~  10/12:  Pt indicated that she stopped filling the Rx for Vit D 50K weekly, ?why? ~  BMD 5/14 was wnl w/ lowest Tscore = -0.2.Marland KitchenMarland Kitchen ~  10/14: she stopped the VitD 50K weekly Rx on her own; Labs 10/14 showed VitD level = 31 & she is requested to take 2000u OTC supplement daily  ~  Labs 10/15 showed VitD level = 22 and she is rec to take OTC VitD supplement ~5000u daily... ~  Pt reminded to take all meds regularly + OTC VitD supplements... ~  Labs 10/17 ?on VitD 1000u daily showed VitD level = 32... rec to incr to 2000u OTC VitD3 supplement daily...  ANXIETY (ICD-300.00)  Hx of SHINGLES (ICD-053.9) - she presented w/ cryptic left flank pain 12/09 that turned out to be Shingles... treated w/ Valtrex, Pred, Vicodin.  HEALTH MAINTENANCE:    ~  GI:  She has not established w/ a GI for routine screening colonoscopy- reminded again about this important screening procedure! ~  GYN:  GYN = DrRomaine in the past... pt states that she stopped going because she couldn'Amanda help her w/ her hot flashes; she is reminded of the need to establish w/ another GYN for PAP, Mammograms, BMD etc... ~  Immuniz:  she refuses Pneumovax & Flu vaccine... She received TDAP 2012 prior to a trip to [place].   Past Surgical History:  Procedure Laterality Date  . breast reductions  2000   [place]  . left knee arthroscopy  1999   by Dr. Eddie Dibbles    Outpatient Encounter Prescriptions as of 07/25/2016  Medication Sig  . albuterol (PROVENTIL HFA;VENTOLIN HFA) 108 (90 Base) MCG/ACT inhaler Inhale 2 puffs into the lungs every 6 (six) hours as needed for wheezing.  Marland Kitchen aspirin 81 MG tablet Take 81 mg by mouth daily.   . cetirizine (ZYRTEC) 10 MG tablet Take 10 mg by mouth daily as needed.  . cholecalciferol (VITAMIN D) 1000 units tablet Take 1,000 Units by mouth daily.  . Fluticasone-Salmeterol (ADVAIR DISKUS) 500-50 MCG/DOSE AEPB Inhale one puff by mouth every twelve hours  . furosemide (LASIX) 20 MG tablet Take 1 tablet (20 mg total)  by mouth daily as needed for fluid (that does not go down overnight).  Marland Kitchen HYDROcodone-homatropine (HYCODAN) 5-1.5 MG/5ML syrup Take 5 mLs by mouth every 6 (six) hours as needed for cough.  . montelukast (SINGULAIR) 10 MG tablet Take 1 tablet (10 mg total) by mouth at bedtime.  . Pseudoephedrine-Ibuprofen (ADVIL COLD & SINUS LIQUI-GELS) 30-200 MG CAPS As needed  . ranitidine (ZANTAC) 75 MG tablet Take 1 tablet (75 mg total) by mouth at bedtime as needed.  . simvastatin (ZOCOR) 40 MG tablet Take 1 tablet (40 mg total) by mouth at bedtime.  Marland Kitchen thyroid (ARMOUR) 60 MG tablet Take 1 tablet by mouth once daily  . albuterol (PROVENTIL HFA;VENTOLIN HFA) 108 (90 BASE) MCG/ACT inhaler Inhale 2 puffs into the lungs every 6 (six) hours as needed  for wheezing.  . [DISCONTINUED] etodolac (LODINE) 400 MG tablet Take 1 tablet (400 mg total) by mouth 2 (two) times daily.  .  Fluticasone-Salmeterol (ADVAIR DISKUS) 500-50 MCG/DOSE AEPB Inhale one puff by mouth every twelve hours  . furosemide (LASIX) 20 MG tablet Take 20 mg by mouth daily as needed.  . pantoprazole (PROTONIX) 40 MG tablet Take 1 tablet (40 mg total) by mouth daily.  . [DISCONTINUED] LORazepam (ATIVAN) 1 MG tablet 1/2 - 1 tablet three times daily as needed for nerves (Patient not taking: Reported on 07/25/2016)  . [DISCONTINUED] Tiotropium Bromide Monohydrate (SPIRIVA RESPIMAT) 2.5 MCG/ACT AERS Inhale 2 puffs into the lungs daily. (Patient not taking: Reported on 07/25/2016)  . [DISCONTINUED] Umeclidinium Bromide (INCRUSE ELLIPTA) 62.5 MCG/INH AEPB Inhale 1 puff into the lungs daily. (Patient not taking: Reported on 07/25/2016)    Allergies  Allergen Reactions  . Azithromycin     zpak does not work for the pt    Current Medications, Allergies, Past Medical History, Past Surgical History, Family History, and Social History were reviewed in [employer] record.    Review of Systems       See HPI - all other systems neg except as noted...      The patient complains of dyspnea on exertion.  The patient denies anorexia, fever, weight loss, weight gain, vision loss, decreased hearing, hoarseness, chest pain, syncope, peripheral edema, prolonged cough, headaches, hemoptysis, abdominal pain, melena, hematochezia, severe indigestion/heartburn, hematuria, incontinence, muscle weakness, suspicious skin lesions, transient blindness, difficulty walking, depression, unusual weight change, abnormal bleeding, enlarged lymph nodes, and angioedema.     Objective:   Physical Exam      WD, overweight, 60 y/o WF in NAD... GENERAL:  Alert & oriented; & cooperative... HEENT:  [place]/AT, EOM-wnl, PERRLA, Fundi-benign, EACs-clear, TMs-wnl, NOSE-clear, THROAT-clear &  wnl. NECK:  Supple w/ full ROM; no JVD; normal carotid impulses w/o bruits; no thyromegaly or nodules palpated; no lymphadenopathy. CHEST:  Clear to P & A; decr BS bilat; without wheezes/ rales/ or rhonchi heard... HEART:  Regular Rhythm; without murmurs/ rubs/ or gallops detected... ABDOMEN:  Soft & nontender; normal bowel sounds; no organomegaly or masses palpated... EXT: without deformities, mild arthritic changes; no varicose veins/ venous insuffic/ or edema. NEURO:  CN's intact; no focal neuro deficits... DERM:  No lesions noted; no rash etc...  RADIOLOGY DATA:  Reviewed in the EPIC EMR & discussed w/ the patient...  LABORATORY DATA:  Reviewed in the EPIC EMR & discussed w/ the patient...   Assessment & Plan:    ASTHMA>  Asked to use the Advair Bid regularly & gauge control by NOT having to use the rescue inhaler... IgE= 68 w/ pos  RAST to Cats, Dust, Dog, some molds & we tried for Xolair treatment but unfortunately this could not be arranged due to her insurance...  4/14> try Singulair=> she wants to continue this rx... 4/15> springtime exac due to pollen & treated w/ Depo80, dosepak, Hycodan... 10/15> reminded to take all regular meds REGULARLY! 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 10/16> she presents w/ an exac, only taking Advair Bid & VentolinHFA prn, she stopped the Spiriva & ?if using the Advair every day; we reviewed the need for daily meds- try Advair500Bid & Incruse daily, plus Rx Pred taper... 4/17> still not controlled & Rx hampered by medication compliance; we Rx w/ Medrol taper + her Advair500 & ProairHFA, needs allergen management & antireflux regimen... 6/17> she feels she is back to baseline; has new [name] insurance & we will try for [facility] again => restarted 05/2016  HYPERLIPID>  On Simva40 & asked to take it nightly to gauge it's  effectiveness; FLP improved w/ more regular dosing; Needs better diet & wt loss...  HYPOTHYROID>  On Armor Thyroid which she insists upon (prev from DrDoerr); TSH looks good when she takes it everyday; Clinically euthyroid, Continue same...  HH/ REFLUX>  Asked to be diligent w/ antireflux measures including the Protonix40Bid & ZantacQhs if nec...  Vit D Deif>  she stopped the 50K treatment on her own; VitD level 10/15= 22 & asked to take 5000u daily OTC supplement...  OVERWEIGHT>  We reviewed diet + exercise needed to lose wt...  Other medical issues as noted...   Patient's Medications  New Prescriptions   No medications on file  Previous Medications   ASPIRIN 81 MG TABLET    Take 81 mg by mouth daily.    CETIRIZINE (ZYRTEC) 10 MG TABLET    Take 10 mg by mouth daily as needed.   CHOLECALCIFEROL (VITAMIN D) 1000 UNITS TABLET    Take 1,000 Units by mouth daily.   HYDROCODONE-HOMATROPINE (HYCODAN) 5-1.5 MG/5ML SYRUP    Take 5 mLs by mouth every 6 (six) hours as needed for cough.   PSEUDOEPHEDRINE-IBUPROFEN (ADVIL COLD & SINUS LIQUI-GELS) 30-200 MG CAPS    As needed   RANITIDINE (ZANTAC) 75 MG TABLET    Take 1 tablet (75 mg total) by mouth at bedtime as needed.  Modified Medications   Modified Medication Previous Medication   ALBUTEROL (PROVENTIL HFA;VENTOLIN HFA) 108 (90 BASE) MCG/ACT INHALER albuterol (PROVENTIL HFA;VENTOLIN HFA) 108 (90 BASE) MCG/ACT inhaler      Inhale 2 puffs into the lungs every 6 (six) hours as needed for wheezing.    Inhale 2 puffs into the lungs every 6 (six) hours as needed for wheezing.   FLUTICASONE-SALMETEROL (ADVAIR DISKUS) 500-50 MCG/DOSE AEPB Fluticasone-Salmeterol (ADVAIR DISKUS) 500-50 MCG/DOSE AEPB      Inhale one puff by mouth every twelve hours    Inhale one puff by mouth every twelve hours   FUROSEMIDE (LASIX) 20 MG TABLET furosemide (LASIX) 20 MG tablet      Take 1 tablet (20 mg total) by mouth daily as needed for fluid (that does not go down  overnight).    Take 20 mg by mouth daily as needed.   MONTELUKAST (SINGULAIR) 10 MG TABLET montelukast (SINGULAIR) 10 MG tablet      Take 1 tablet (10 mg total) by mouth at bedtime.    Take 1 tablet (10 mg total)  by mouth at bedtime.   PANTOPRAZOLE (PROTONIX) 40 MG TABLET pantoprazole (PROTONIX) 40 MG tablet      Take 1 tablet (40 mg total) by mouth daily.    Take 1 tablet (40 mg total) by mouth 2 (two) times daily.   SIMVASTATIN (ZOCOR) 40 MG TABLET simvastatin (ZOCOR) 40 MG tablet      Take 1 tablet (40 mg total) by mouth at bedtime.    Take 1 tablet (40 mg total) by mouth at bedtime.   THYROID (ARMOUR) 60 MG TABLET thyroid (ARMOUR) 60 MG tablet      Take 1 tablet by mouth once daily    Take 2 tablets by mouth once daily  Discontinued Medications   ETODOLAC (LODINE) 400 MG TABLET    Take 1 tablet (400 mg total) by mouth 2 (two) times daily.   LORAZEPAM (ATIVAN) 1 MG TABLET    1/2 - 1 tablet three times daily as needed for nerves   TIOTROPIUM BROMIDE MONOHYDRATE (SPIRIVA RESPIMAT) 2.5 MCG/ACT AERS    Inhale 2 puffs into the lungs daily.   UMECLIDINIUM BROMIDE (INCRUSE ELLIPTA) 62.5 MCG/INH AEPB    Inhale 1 puff into the lungs daily.

## 2016-08-01 ENCOUNTER — Other Ambulatory Visit: Payer: Self-pay | Admitting: Pulmonary Disease

## 2016-08-07 ENCOUNTER — Telehealth: Payer: Self-pay | Admitting: Pulmonary Disease

## 2016-08-07 NOTE — Telephone Encounter (Signed)
Will forward message to TS to follow up on deliver of xolair

## 2016-08-07 NOTE — Telephone Encounter (Addendum)
#   vials:1 Ordered date:08/07/16 Shipping Date:08/20/16  # Vials:1 Arrival Date:08/21/16 Lot [id number] Exp Date:5/21

## 2016-08-08 NOTE — Telephone Encounter (Signed)
Tammy, please advise if this message can be closed. Thanks.

## 2016-08-27 ENCOUNTER — Ambulatory Visit (INDEPENDENT_AMBULATORY_CARE_PROVIDER_SITE_OTHER): Payer: 59

## 2016-08-27 DIAGNOSIS — J4540 Moderate persistent asthma, uncomplicated: Secondary | ICD-10-CM

## 2016-08-27 MED ORDER — OMALIZUMAB 150 MG ~~LOC~~ SOLR
150.0000 mg | SUBCUTANEOUS | Status: DC
  Administered 2016-08-27: 150 mg via SUBCUTANEOUS

## 2016-09-11 ENCOUNTER — Telehealth: Payer: Self-pay | Admitting: Pulmonary Disease

## 2016-09-11 NOTE — Telephone Encounter (Signed)
#   vials:1 Ordered date:09/11/16 Shipping Date:09/19/16

## 2016-09-18 ENCOUNTER — Telehealth: Payer: Self-pay | Admitting: Pulmonary Disease

## 2016-09-18 NOTE — Telephone Encounter (Signed)
Pt calling a/b refill for lasix.Amanda [name]

## 2016-09-18 NOTE — Telephone Encounter (Signed)
LMOMTCB x 1 

## 2016-09-18 NOTE — Telephone Encounter (Signed)
Lmtcb.

## 2016-09-19 MED ORDER — FUROSEMIDE 20 MG PO TABS: 20.0000 mg | ORAL_TABLET | Freq: Every day | ORAL | 11 refills | Status: DC | PRN

## 2016-09-19 NOTE — Telephone Encounter (Signed)
Patient is returning phone call.  °

## 2016-09-19 NOTE — Telephone Encounter (Signed)
Spoke with pt, requesting refill on lasix.  This has been sent to preferred pharmacy.  Nothing further needed.

## 2016-09-20 NOTE — Telephone Encounter (Signed)
#   Vials:1 Arrival Date:09/20/16 Lot [id number] Exp Date:6/21

## 2016-09-27 ENCOUNTER — Ambulatory Visit (INDEPENDENT_AMBULATORY_CARE_PROVIDER_SITE_OTHER): Payer: 59

## 2016-09-27 DIAGNOSIS — J4520 Mild intermittent asthma, uncomplicated: Secondary | ICD-10-CM | POA: Diagnosis not present

## 2016-09-27 MED ORDER — OMALIZUMAB 150 MG ~~LOC~~ SOLR
150.0000 mg | SUBCUTANEOUS | Status: DC
Start: 2016-09-27 — End: 2018-01-02
  Administered 2016-09-27: 150 mg via SUBCUTANEOUS

## 2016-10-15 ENCOUNTER — Telehealth: Payer: Self-pay | Admitting: Pulmonary Disease

## 2016-10-15 NOTE — Telephone Encounter (Signed)
#   vials:1 Ordered date: Originally I called 10/11/16 and [facility] told me I could set up the order the had told them she'd do the ordering. I called pt. Today she said the pharmacy called her 4 different times to get her permission to ship. When I talked to the pharmacy a date had been set up for 10/16/16.  Shipping Date:10/15/16

## 2016-10-16 NOTE — Telephone Encounter (Signed)
#   Vials: 1 Arrival Date:10/16/16 Lot [id number] Exp Date:8/21

## 2016-10-31 ENCOUNTER — Ambulatory Visit: Payer: 59

## 2016-10-31 ENCOUNTER — Ambulatory Visit (INDEPENDENT_AMBULATORY_CARE_PROVIDER_SITE_OTHER): Payer: 59

## 2016-10-31 DIAGNOSIS — J4520 Mild intermittent asthma, uncomplicated: Secondary | ICD-10-CM

## 2016-11-01 DIAGNOSIS — J4520 Mild intermittent asthma, uncomplicated: Secondary | ICD-10-CM | POA: Diagnosis not present

## 2016-11-01 MED ORDER — OMALIZUMAB 150 MG ~~LOC~~ SOLR
150.0000 mg | SUBCUTANEOUS | Status: DC
  Administered 2016-11-01: 150 mg via SUBCUTANEOUS

## 2016-11-01 NOTE — Progress Notes (Signed)
Documentation and charges have been completed by Makaio Mach, CMA based on the Xolair documentation sheet completed by Tammy Scott.  

## 2016-11-07 ENCOUNTER — Other Ambulatory Visit: Payer: Self-pay | Admitting: *Deleted

## 2016-11-18 ENCOUNTER — Telehealth: Payer: Self-pay | Admitting: Pulmonary Disease

## 2016-11-18 NOTE — Telephone Encounter (Signed)
P/A papers were faxed to [place] on 11/06/16, I faxed them back 11/07/16. I have tried to call [facility] several times, I could never get a person. I called [facility] today and Cathy said no p/a was needed. When I called to order med. The rep. said a p/a was needed (ins. Requested, p/a has been initiated.), I told her I had been trying to reach [facility] about it but I couldn't talk to a person. She said they needed to get the pt.'s Permission to ship for this year, then they would call to set up shipment.

## 2016-11-21 NOTE — Telephone Encounter (Signed)
Moe with [facility] [phone] calling regarding Xolair PA.

## 2016-11-22 NOTE — Telephone Encounter (Signed)
I was finally able to talk to someone and find out the pt. Needs to call and update her benefits. I called her today, she has tried to get her ins. Switched to [employer] but the person she talked to didn't know and did not offer to transfer her to someone that could help. Her company was bought out and this is were she's at.

## 2016-11-28 NOTE — Telephone Encounter (Signed)
[facility]manda [name] called from [facility] and states that he is unable to approve Xolair because patient doesn't have ins coverage as of 11/06/16. He can be reached at [phone] -pr

## 2016-12-03 NOTE — Telephone Encounter (Signed)
Pt. Returned my call. She send [employer]  the necessary info, Mon.12/02/25 and over nighted it. It might take up to 7 to 14 days. I will wait to hear from the pt.,[employer], or [facility]. Nothing further needed at this time. Closing note.

## 2016-12-03 NOTE — Telephone Encounter (Signed)
Called Dan for a second time, pt. Needs to call the customer service # on the back of her card and switch to cobra. Since12/31/18 her [employer] was terminated. Called pt. On cell ph. And was unable to reach her. Will try again later.(work # this time,)

## 2016-12-16 ENCOUNTER — Telehealth: Payer: Self-pay | Admitting: Pulmonary Disease

## 2016-12-16 NOTE — Telephone Encounter (Signed)
Will route to [employer] per her request.

## 2016-12-17 NOTE — Telephone Encounter (Signed)
Need to call [facility] for P/A, I will take care of it as soon as I get Nucala ordered.

## 2016-12-18 NOTE — Telephone Encounter (Signed)
Called that # and started a prior auth. B/c the pharmacy Told me  the p/a that was started in Feb. Had been closed. He said we should hear something in 24 to 72 hrs.

## 2016-12-18 NOTE — Telephone Encounter (Signed)
Forwarding to [employer] to follow up on.

## 2016-12-18 NOTE — Telephone Encounter (Signed)
[employer] Arbie Cookey called back [phone]  Amanda [name] was denied due to lack of info if a peer to peer is wanted call [phone]   There fax # is [phone]

## 2016-12-18 NOTE — Telephone Encounter (Signed)
Called [employer] yesterday , they call [facility] who I haave called a 2 or 3 times and they said call [employer]. So I'm calling [employer] today.

## 2016-12-19 NOTE — Telephone Encounter (Signed)
This morning I received a fax saying the requested product is a plan exculsion for this member, so there is no coverage criteria to review and apply. It also said to have member to contact [employer] Services for additional info. On plan benefits.

## 2016-12-19 NOTE — Telephone Encounter (Signed)
Called pt. She is aware and will call [employer] when she can.

## 2016-12-27 NOTE — Telephone Encounter (Signed)
Pt. Called today, I told her we had not forgotten her, "I've gone as far as I can go with it, I called Katie for reinforcements.  We will call you as soon as we know something.

## 2017-01-02 NOTE — Telephone Encounter (Signed)
Katie's helping me out with Mrs.Vallier.

## 2017-01-13 NOTE — Telephone Encounter (Signed)
TS any updates on the status of the medication for this Pt?  thanks

## 2017-01-23 NOTE — Telephone Encounter (Signed)
KW and TS will be working on this message.

## 2017-01-27 ENCOUNTER — Ambulatory Visit (INDEPENDENT_AMBULATORY_CARE_PROVIDER_SITE_OTHER)
Admission: RE | Admit: 2017-01-27 | Discharge: 2017-01-27 | Disposition: A | Discharge status: Home / Self Care | Payer: 59 | Source: Ambulatory Visit | Attending: Pulmonary Disease | Admitting: Pulmonary Disease

## 2017-01-27 ENCOUNTER — Ambulatory Visit (INDEPENDENT_AMBULATORY_CARE_PROVIDER_SITE_OTHER): Payer: 59 | Admitting: Pulmonary Disease

## 2017-01-27 ENCOUNTER — Encounter: Payer: Self-pay | Admitting: Pulmonary Disease

## 2017-01-27 VITALS — BP 138/86 | HR 66 | Temp 98.0°F | Ht 65.0 in | Wt 187.2 lb

## 2017-01-27 DIAGNOSIS — J302 Other seasonal allergic rhinitis: Secondary | ICD-10-CM | POA: Diagnosis not present

## 2017-01-27 DIAGNOSIS — J4540 Moderate persistent asthma, uncomplicated: Secondary | ICD-10-CM | POA: Diagnosis not present

## 2017-01-27 DIAGNOSIS — E559 Vitamin D deficiency, unspecified: Secondary | ICD-10-CM

## 2017-01-27 DIAGNOSIS — M8949 Other hypertrophic osteoarthropathy, multiple sites: Secondary | ICD-10-CM

## 2017-01-27 DIAGNOSIS — M150 Primary generalized (osteo)arthritis: Secondary | ICD-10-CM | POA: Diagnosis not present

## 2017-01-27 DIAGNOSIS — E663 Overweight: Secondary | ICD-10-CM

## 2017-01-27 DIAGNOSIS — E782 Mixed hyperlipidemia: Secondary | ICD-10-CM | POA: Diagnosis not present

## 2017-01-27 DIAGNOSIS — K219 Gastro-esophageal reflux disease without esophagitis: Secondary | ICD-10-CM | POA: Diagnosis not present

## 2017-01-27 DIAGNOSIS — M159 Polyosteoarthritis, unspecified: Secondary | ICD-10-CM

## 2017-01-27 DIAGNOSIS — E039 Hypothyroidism, unspecified: Secondary | ICD-10-CM

## 2017-01-27 DIAGNOSIS — I872 Venous insufficiency (chronic) (peripheral): Secondary | ICD-10-CM | POA: Diagnosis not present

## 2017-01-27 MED ORDER — PREDNISONE 20 MG PO TABS: ORAL_TABLET | ORAL | 1 refills | Status: DC

## 2017-01-27 NOTE — Progress Notes (Signed)
Subjective:    Patient ID: Amanda [name], female    DOB: [birth year], 61 y.o.   MRN: [id number]  HPI 61 y/o WF here for a follow up visit... she has multiple medical problems as noted below... she was prev followed by DrWelty-Wolfe at [facility] for her Asthma, then switched to [facility], but both of them have left... I tried to get her to see Dr. Yates Decamp but pt has refused further referrals for her Asthma... the concern has been for the developement of fixed airway obstruction from her Asthma due to poor control and medication non-compliance in the past...  ~  SEE PREV EPIC NOTES FOR OLDER DATA >>    LABS 10/14:  FLP- at goals on Simva40;  Chems- ok x BS=118;  CBC- wnl;  TSH=0.43;  VitD=31 & rec to take 2000u daily...  LABS 10/15:  FLP- not at goal on Simva40 (?compliance);  Chems- ok w/ BS=108;  CBC- wnl x 8%eos;  TSH=6.70 & reminded to take her ArmourThyroid everyday;  VitD=22 & rec OTC VitD5000u daily...  ~  January 13, 2015:  28mo ROV & [name] reports breathing at baseline but seasonal allergies are kicking in; she also c/o some LBP, thinks it's her bed, offered XRays vs Ortho but she declines, advised heat/ back brace/ Tylenol etc... We reviewed the following medical problems during today's office visit >>     AR, Asthma> on Advair500Bid, ProairHFA prn, & Zyrtek10 (she stopped the Singulair on her own); she is an excellent Xolair cand but they couldn't get it worked out w/ her insurance (copay too $$); hasn't used any Pred in ?58yr...    Her medical management has been repeatedly hampered over the years by medication non-compliance...     Hyperlipid> on Simva40; last FLP 10/15 showed TChol 205, TG 72, HDL 42, LDL 149=> not as good as prev & I suspect compliance issues- asked to take it every eve, + better diet/ exerc/ wt reduction...    Hypothyroid> on ArmourThyroid60-2 tabs/d & she refuses Synthroid Rx; TSH 10/15 = 6.70 & she is clinically euthyroid, again reminded of the need for  100% compliance w/ dosing...    Overweight> wt down to 186# & we reviewed diet, exercise, wt reduction strategies...    HH, Reflux> on Protonix40Bid + Zantac75 as needed; she knows to use max antireflux regimen w/ elev HOB, NPO after dinner, etc...    GYN> prev seen by [facility] but she stopped going when they didn't help her hot flashes; she is again advised to estab w/ another GYN for Pelvic, Pap, Mammography, BMD, etc (she still has not complied w/ this request)...     DJD> hx left knee pain & arthroscopy 1999 by DrPaul; she uses OTC analgesics as needed...    VitD defic> intermittently on VitD50K weekly from [place]; stopped on her own- took OTC VitD supplement but she stopped that too; last VitD level 10/14 = 31 & asked to resume daily vit supplements (never did- asked again)...    Anxiety> offered Alpraz or similar Benzo in past but she declines med rx... 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 PLAN>>  We discussed adding Spiriva Respimat- 2sp daily due to her Spirometry report; continue REGULAR DOSING of all meds including Advair500Bid; she refuses to  take Mucinex, refuses nasal sprays, refuses to sched w/ Gyn...  ~  July 18, 2015:  16mo ROV & [name] presents w/ recent incr SOB, cough, wheezing & chest tightness which she thinks is related to "weather changes and allergies";  Notes baseline DOE w/ "a bunch of stuff" like housework, taking out the trash, etc; we have discussed need for diet, exercise, wt reduction on mult occais but she has been unable to comply...    Asthma/ AB> on Advair500Bid, VentolinHFA prn (uses thisQid recently); she hasn't had Pred in a long time & stopped Spiriva & Singulair on her own; I have rec that she add INCRUSE one inhalation daily &  Pred20mg - tapering schedule...    HL> supposed to be on Simva40; [place] 10/16 shows TChol 219, TG 86, HDL 51, LDL 151 and a call to her Pharm indicates no recent refills of this med- we discussed taking the stain med every day, regularly, preferrably Qhs...    Hypothyroid> supposed to be on her Armour Thyroid 60-2tabs daily; LABS 07/18/15 showed TSH=10.51 and she is reminded to take it every day so we can determine her optimal dose...    Overweight>  Weight today is 188# up 2# & we reviewed diet/ exercise/ wt reduction...    Vit D defic>  Supposed to be on OTC Vit D supplement daily but she declines to take it regularly; LABS 07/2015 shows VitD level = 17 and asked to restart VitD 50K weekly dosing...  EXAM shows Afeb, VSS, O2sat=96% on RA;  HEENT- neg, mallampati2;  Chest- mild exp wheezing, scat rhonchi, no rales or consolidation;  Heart- RR w/o m/r/g;  Abd- soft, nontender, neg;  Ext- w/o c/c/e;  Neuro- intact...  LABS 10/17/14>  FLP- not at goals but she hasn't filled the Simva40 in several months;  Chems- wnl x BS=112;  CBC- wnl;  TSH=10.51;  VitD=16.6.Marland KitchenMarland Kitchen  IMP/PLAN>>  [name] has an AB exac & we will treat w/ Pred tapering sched but she needs to take her regular meds daily as maintenance, we discussed adding INCRUSE to her Advair;  Her LABS also reflect poor compliance w/ medications and she PROMISES to do better...  ~  January 16, 2016:  75mo ROV & [name] reports increased stress as her sister passed away in 2022-11-25 w/ lung cancer [name]- never smoked, treated in [place]);  She also notes that her asthma has been acting up lately and that her cat is ill- now required to live inside w/ resultant incr asthma symptoms as well; c/o nasal drainage, dry hacking cough, chest tight & wheezing, notes SOB/DOE w/ regular walking, mild cough & min beige sput; she states she had a weird reaction to Doxy prev & refuses to take this antibiotic again...     Allergies & cat now resides indoors>  We reviewed need for  antihist, Flonase, saline, and a good electrostatic air cleaner in her bedroom...    Asthma/ AB>  On Advair500Bid, ProairHFA- using it Bid, not taking the INCRUSE (she says "didn't have success w/ Spiriva or Incruse"), has Tessalon & Hycodan for prn use;       HL>  Says she is doing the simva40 daily now but not fasting today...    Hypothy>  She says she is taking the Armour thyroid60-2 Qam regularly; TSH today 01/16/16 = 0.10 indicating over-replaced when taking 120mg  every day- REC to decr to 60mg  daily...    Anxiety>  Her sister passed away, her cat is ill & living indoors now affecting  her allergies & asthma acting up; offered Alpraz or similar rx but she declines... EXAM shows Afeb, VSS, O2sat=95% on RA;  HEENT- neg, mallampati2;  Chest- mild exp wheezing, scat rhonchi, no rales or consolidation;  Heart- RR w/o m/r/g;  Abd- soft, nontender, neg;  Ext- w/o c/c/e;  Neuro- intact...  CXR 01/16/16>  Norm heart size, clear lungs w/ mild left basilar atx, DJD Tspine, NAD...   Spirometry 01/16/16>  FVC=1.88 (58%), FEV1=1.10 (43%), %1sec=58%, mid-flows reduced at 18% predicted;  This is c/o a mod obstructive ventilatory defect  And GOLD Stage3 COPD...]  LABS 01/16/16>  TSH= 0.10 on the Armour thyroid 60mg - 2/d... This is oversuppressed & needs to back off to 60mg /d. IMP/PLAN>>  Nakayla will not take any additional inhalers so we will Rx w/ Medrol 8mg  slow wean + her Advair500Bid & ProairHFA rescue;  She will re-double her efforts at allergy Rx w/ Antihist Qam, Flonase Bid, Saline as needed & get the electrostatic air cleaner for her bedroom;  Finally add vigorous antireflux regimen w/ Protonix40 before dinner, NPO after dinner (she refuses stating "I drink stuff"), Elev HOB 6"... Rov in ~6weeks recheck.  ~  March 19, 2016:  27mo ROV & Clydell notes that her breathing is back to baseline- she notes that she has new insurance now [facility] [employer]) & I feel it would be helpful to re-submit request for [facility] for her  Asthma, she is in agreement as a prev shot seemed to help but her insurance copay was prohibitive in past... Her CC is nocturnal leg pain x several weeks- down left leg & she denies injury, no LBP, & the pain is NOT present when she is on Medrol, OTC Advil w/ sl benefit as well; she notes some swelling as well; we decided to try Etodolac400Bid first- plus low sodium/ elevation/ support hose & consider further eval if not responding...     Allergies & cat now resides indoors>  We reviewed need for Antihist, Flonase, Saline, and a good electrostatic air cleaner in her bedroom...    Asthma/ AB>  On Advair500Bid, Singulair10, ProairHFA- using it Bid, not taking the INCRUSE (she says "didn't have success w/ Spiriva or Incruse"), has Tessalon & Hycodan for prn use;       HL>  Says she is doing the Simva40 daily now but not fasting today...    Hypothy>  She says she is taking the Armour Thyroid60-2 Qam regularly; TSH 01/16/16 = 0.10 indicating over-replaced when taking 120mg  every day- REC to decr to 60mg  daily...    Anxiety>  Her sister passed away 19-Nov-2022- lung cancer), her cat is ill & living indoors now affecting her allergies & asthma acting up; offered Alpraz or similar rx but she declines... EXAM shows Afeb, VSS, O2sat=95% on RA;  HEENT- neg, mallampati2;  Chest- mild exp wheezing, scat rhonchi, no rales or consolidation;  Heart- RR w/o m/r/g;  Abd- soft, nontender, neg;  Ext- w/o c/c/e;  Neuro- intact... IMP/PLAN>>  We decided to try the Etodolac400bid before getting additional w/u for the leg pain; rec to elim sodium/ elev legs/ use support hose of the swelling; we will request her new insurance [employer] to approve XOLAIR for her severe asthma...  ~  July 25, 2016:  45mo ROV & Amiley is here for a pulmonary recheck & general follow up visit>  During her last visit we prescribed Lodine400 to try for her leg pain before considering further eval- pt tells me that the Pharm hand-out said this  drug could cause  cancer so she never filled the Rx & didn't call to discuss or pick an alternative; she fell in June & injured her right ankle- chose to see a podiatrist s/p 4 visits and eventually given Lasix20 prn swelling, states her pain is pretty much gone now... we reviewed the following medical problems during today's office visit >>     Her medical management has been repeatedly hampered over the years by medication non-compliance...     AR> on OTC antihist prn, Singulair10/d; prev RAST testing 10/13 showed IgE=68, Rast panel w/ CAT>>dust>mold>dog; she has cats and asked to exclude them from the bedroom & set up air cleaner etc but she has not done this.    AR, Asthma> on Advair500Bid (but only taking once per day), Singulair10, ProairHFA prn (hasn't needed); she started XOLAIR in Aug2017- monthly injections; she hasn't used any Pred in ?68yr; unfortunately her PFTs showed mod airflow obstruction which I believe is fixed due to her poorly controlled asthma x many yrs; she has refused adding an anticholinergic like Spiriva or Incruse to her regimen...    LPR> on Protonix40 (declined to incr to Bid), Zantac Qhs (not taking); she was asked to elev [facility] & NPO after dinner but she has not complied w/ these requests either...    Hyperlipid> on Simva40; FLP 10/17 shows TChol 159, TG 64, HDL 51, LDL 95=> looks great w/ better Simva40 compliance + better diet/ exerc/ wt reduction...    Hypothyroid> she was on ArmourThyroid60-2 tabs/d & she refuses Synthroid Rx; TSH 4/17 = 0.10 & we rec decr to one 60gr tab daily; f/u TSH 10/17 on 60gr/d shows TSH= 0.54, continue same.    Overweight> wt stable at 188# & BMI=31-2; we reviewed diet, exercise, wt reduction strategies...    HH, Reflux> on Protonix40 + Zantac75 as needed; she knows to use max antireflux regimen w/ elev HOB, NPO after dinner, etc...    GYN> prev seen by [facility] but she stopped going when they didn't help her hot flashes; she is again advised to estab w/ another GYN  for Pelvic, Pap, Mammography, BMD, etc (she still has not complied w/ this request)...     DJD, ven insuffic> hx left knee pain & arthroscopy 1999 by DrPaul; she uses OTC analgesics as needed; mild VI & rec to elim salt, elev legs, wear support hose; she has Lasix20 prn edema...    VitD defic> intermittently on VitD50K weekly from [place]; stopped on her own- took OTC VitD supplement but she stopped that too; VitD level 10/17 = 32 & asked to resume daily vit supplements...    Anxiety> offered Alpraz or similar Benzo in past but she declines med rx... EXAM shows Afeb, VSS, O2sat=97% on RA;  HEENT- neg, mallampati2;  Chest- decr BS bilat but clear w/o w/r/r;  Heart- RR w/o m/r/g;  Abd- soft, min epig tender, neg;  Ext- w/o c/c/e;  Neuro- intact...  LABS 07/25/18>  FLP- all parameters at goals on Simva40;  Chems- ok x FBS=117 & we discussed diet/ wt reduction;  CBC- wnl x 8% eos;  TSH=0.54;  VitD=32... IMP/PLAN>> Kollyns's medical issues are improved w/ better med compliance & it is hoped that her asthma will improve as well using her inhalers + Xolair (if not reponding then she'd be a cand for Nucala w/ incr eos);  Continue current rx & the ArmourThyroid60 + add VitD3 ~2000u daily... Note FBS at 117 & she must get on a low  carb/ no seets diet & get wt down to avoid DM meds in her future...   ~  January 27, 2017:  83mo ROV & Ivett's last Xolair shot was 10/31/16 as we continued to try & get insurance company approval [facility]) for this medication;  She has severe life-long asthma, never smoked, and has developed a component of fixed airway obstruction due to her severe asthma over many yrs;  We were able to try the Xolair & she has reported a fabulous response to this medication w/o exacerbations/ asthma attacks/ etc while on the drug;  Since she has been off she notes her symptoms have worsened w/ cough, sneezing, drainage, watery eyes, small amt of beige sput, no hemoptysis, no CP, but increased SOB-  progressive to dyspneic w/ min activ;  She is reminded to stay on her prescribed regimen of> ADVAIR500Bid, SINGULAIR10/d, ZYRTEK10/d, PROAIR-HFA rescue inhaler prn (she's been using this 4x per day recently), she refuses LAMA rx (we tried Spiriva & Incruse but compliance was poor & she felt benefit was min); Note- last Prednisone usage was >53yr ago she says... We reviewed above problem list & meds...    EXAM shows Afeb, VSS, O2sat=95% on RA;  Wt=187#, BMI=31-2;  HEENT- neg, mallampati2;  Chest- decr BS bilat but clear w/o w/r/r;  Heart- RR w/o m/r/g;  Abd- soft, min epig tender, neg;  Ext- w/o c/c/e;  Neuro- intact...  CXR 01/27/17 (independently reviewed by me in the PACS system) showed norm heart size, clear lungs, NAD... IMP/PLAN>>  Kaelee has a long hx of severe difficult to control asthma, she has had a prob w/ medication compliance over the yrs, she has required freq courses of Prednisone in the past;  Since starting on xolair from the manufacturer she has noticed a signif improvement in her asthma control 7 has not had any exac, unfortunately we are unable to get her any more drug w/o her insurance company approval for the [place]- we are continuing our prior authorization process and letter writing campaign to procure this med on her behalf; in the meanwhile she will continue our chronic regimen as above PLUS control of cat dander, antireflux regimen, etc...     NOTE: >50% of this 81min ov was spent in counseling & coordination of care...         Problem List:  ALLERGIC RHINITIS (ICD-477.9)  ++allergy testing by DrESL in the 1980's w/ reactions to grasses, ragweed, dust, molds, cats, dog... prev on shots... ~  10/12:  She reports a new kitten at home, but denies allergy or asthma exac so far... ~  10/13:  I cannot find documentation of IgE/ RAST testing (?done at [facility] in the past, she was in a drug trial 2006 at [facility]); we sent labs today> IgE=  68 & RAST pos for Cat> Dust> Dog> molds...  We decided  to apply for [facility] Rx => they could not secure payment from her insurance... ~  10/14:  on Zyrtek, Singulair10, Advair500, AlbutHFA prn; breathing has been good, allergies acting up & she has dermatitis on face- wants Dosepak- ok ~  4/15: presents c/o spring allergy symptoms on Singulair10 & Zyrtek OTC, refuses nasal sprays; Rec Depo80, Pred dosepak, etc... ~  4/16: this spring hasn't been so bad, yet; she stopped the Singulair on her own, won't use any nasal sprays; advised to call if round of Pred needed... ~  4/17: on OTC antihist prn, Singulair10/d; prev RAST testing 10/13 showed IgE=68, Rast panel w/ CAT>>dust>mold>dog; she has  cats and asked to exclude them from the bedroom & set up air cleaner etc but she has not done this.  ASTHMA (ICD-493.90) - on ADVAIR 500Bid, & VENTOLIN HFA as needed... she admits to using Advair once dailly & is encouraged to take meds regularly as perscribed; similarly she overuses the rescue inhaler & admonished to not use it more than Qid... ~  baseline CXR shows sl incr markings, NAD... ~  PFT's 5/05 showed FVC= 2.47 (76%), FEV1= 1.51 (56%), %1sec=61, mid-flows= 22%pred... ~  in 2006 she entered the EXTRA trial at [facility] which was a double blind trial testing XOLAIR... ~  last note from [facility] 6/07 DrCarraway w/ concern for her fixed airway obstruction... pt refused f/u in the [place]... ~  f/u PFT 10/11 showed FVC= 2.52 (71%), FEV1= 1.69 (60%), %1sec=67, mid-flows= 38%pred. ~  CXR 10/11 showed clear lungs, NAD... ~  10/13:  Treated for exac w/ Pred course; rechecked labs including IgE= 68 & RAST pos for Cat> Dust> Dog> molds... We decided to apply for [facility] Rx but they could not arrange payment from her insurance... She states that the 1st shot really helped... ~  10/14: on Zyrtek, Singulair10, Advair500, AlbutHFA prn; breathing has been good, allergies acting up & she has dermatitis on face- wants Dosepak- ok. ~  4/15: on Zyrtek10, Advair500Bid, Singulair10,  ProairHFA prn (lately incr to qid); she is an excellent Xolair cand but they couldn't get it worked out w/ her insurance; hasn't used any Pred in >1mo til this visit w/ spring pollen. ~  10/15: on same meds but only doing the Advair once/d & not regular w/ the Singulair; as a result she continues to use the rescue inhaled 1-2/wk she says; we discussed taking meds regularly... 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 ~  05/2016> she has restarted the Xolair due to better coverage from her [employer] insurance...  HYPERLIPIDEMIA (ICD-272.4) - prev followed by DrDoerr, Endocrinology in [place]... prev on Zetia, now on SIMVASTATIN 40mg /d but admits to intermittent dosing. ~  4/11: pt presents w/ request to start writing Simva Rx & monitor labs since DrDoerr has left [place]... encouraged to take med regularly & follow low chol/ low fat diet... ~  FLP 10/11 on Simva40 showed TChol 198, TG 110, HDL 46, LDL 131 ~  FLP 10/12 on Simva40 showed TChol 172, TG 82, HDL 51, LDL 105... Improved, reminded of diet & compliance. ~  FLP 10/13 on Simva40 (not taking daily) showed TChol 229, TG 60, HDL 48, LDL 170==> rec to take med daily & refer to [facility]! ~  FLP 10/14 on Simva40 showed TChol 138, TG 78, HDL 42, LDL 81... Great job, continue daily dosing... ~  [place] 10/15 on Simva40 (?compliance) showed TChol 205, TG 72, HDL 42, LDL 149... Reminded to  take it everyday, low fat diet & get the wt down. ~  FLP 10/16 on Simva40 ?compliance showed  TChol 219, TG 86, HDL 51, LDL 151... Discussed taking it everyday! ~  [place] 10/17 on simva40 w/ better compliance sowed TChol 159, TG 64, HDL 51, LDL 95 & this is much improved...  HYPOTHYROIDISM (ICD-244.9) - taking ARMOUR THYROID 60mg - 2tabs daily... she was followed by DrDoerr, Endocrine in [place] for thyroid and her hyperlipidemia... she was prev on Levothyroid and had requested change to Armour Thyroid because she believed the Levothy was making her hair fall out... ~  4/11:  pt reqested that we write her Rx for the Armour Thyroid 60mg  since DrDoerr has left [place]... pt is uncouraged to take the med daily. ~  labs 10/11 showed TSH= 4.69 ~  Labs 10/12 on ArmourThyroid60-2/d showed TSH= 2.88... rec to continue same. ~  Labs 10/13 on ArmourThyroid60-2/d (not taking daily) showed TSH= 5.91==> rec to take med every day!!! ~  Labs 10/14 on ArmourThyroid60-2/d showed TSH=0.43 ~  Labs 10/15 on ArmourThyroid60-2/d showed TSH=6.70 and reminded to take all meds every day... ~  Labs 10/16 on Armour Thyroid60-2/d showed TSH=10.51 and we again discussed taking the med every day, 1st thing in AM etc... ~  Labs 4/17 on Armour Thyroid60-2/d & she assures me taking it regularly showed TSH=0.10! Therefore decr to one tab per day... ~  Labs 10/17 on Armour thyroid60/d showed TSH= 0.54 & rec to continue the same...  OVERWEIGHT (ICD-278.02) - we reviewed diet + exercise program required to lose weight... ~  weight 9/10 = 200#,  she is 5\' 5"  tall,  BMI= 34. ~  weight 4/11 = 192#,  this is as good as she's been in several yrs. 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 she continues to treat her reflux as needed w/ the Nexium and OTC Zantac even though we have felt this plays a signif roll in her Asthma... the consultants at [facility] agreed, but were no more effective at getting her to take her meds regularly... 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 ~  meds re-written & encouraged to take PROTONIX 40mg /d- 30 min before the 1st meal, and Zantac 75- 1-2 at bedtime... ~  4/14: she requests switch to NEXIUM40 from the Protonix... ~  She has been poorly compliant w/ her antireflux regimen...  GYN = DrRomaine in the past... pt states that she stopped going because she couldn't help her w/ her hot flashes. ~  4/14: reminded of the importance of regular Gyn checks- pelvic, Pap, Mammograms, etc...  OSTEOARTHRITIS (ICD-715.90) - she had a left knee arthroscopy by DrPaul in 1999...  VITAMIN D DEFICIENCY (ICD-268.9) - pt was on Vit D 50,000 u weekly from DrDoerr... we don't have prev labs from her but pt indicates that the Vit D level was "really really low" so we will refill the Rx & f/u Vit D level later. ~  labs 10/11 showed Vit D level = 44... OK to switch to 1-2000u daily OTC supplement. ~  4/12:  Pt requested to go back on Vit  D 50000u weekly... ~  10/12:  Pt indicated that she stopped filling the Rx for Vit D 50K weekly, ?why? ~  BMD 5/14 was wnl w/ lowest Tscore = -0.2.Marland KitchenMarland Kitchen ~  10/14: she stopped the VitD 50K weekly Rx on her own; Labs 10/14 showed VitD level = 31 & she is requested to take 2000u OTC supplement daily  ~  Labs 10/15 showed VitD level = 22 and she is rec to take OTC VitD supplement ~5000u daily... ~  Pt reminded to take all meds regularly + OTC VitD supplements... ~  Labs 10/17 ?on VitD 1000u daily showed VitD level = 32... rec to incr to 2000u OTC VitD3 supplement daily...  ANXIETY (ICD-300.00)  Hx of SHINGLES (ICD-053.9)  - she presented w/ cryptic left flank pain 12/09 that turned out to be Shingles... treated w/ Valtrex, Pred, Vicodin.  HEALTH MAINTENANCE:   ~  GI:  She has not established w/ a GI for routine screening colonoscopy- reminded again about this important screening procedure! ~  GYN:  GYN = DrRomaine in the past... pt states that she stopped going because she couldn't help her w/ her hot flashes; she is reminded of the need to establish w/ another GYN for PAP, Mammograms, BMD etc... ~  Immuniz:  she refuses Pneumovax & Flu vaccine... She received TDAP 2012 prior to a trip to [place].   Past Surgical History:  Procedure Laterality Date  . breast reductions  2000   in [place]  . left knee arthroscopy  1999   by Dr. Eddie Dibbles    Outpatient Encounter Prescriptions as of 01/27/2017  Medication Sig  . albuterol (PROVENTIL HFA;VENTOLIN HFA) 108 (90 Base) MCG/ACT inhaler Inhale 2 puffs into the lungs every 6 (six) hours as needed for wheezing.  Marland Kitchen aspirin 81 MG tablet Take 81 mg by mouth daily.   . cetirizine (ZYRTEC) 10 MG tablet Take 10 mg by mouth daily as needed.  . cholecalciferol (VITAMIN D) 1000 units tablet Take 1,000 Units by mouth daily.  . Fluticasone-Salmeterol (ADVAIR DISKUS) 500-50 MCG/DOSE AEPB Inhale one puff by mouth every twelve hours  . furosemide (LASIX) 20 MG tablet Take 1 tablet (20 mg total) by mouth daily as needed for fluid.  . montelukast (SINGULAIR) 10 MG tablet TAKE 1 TABLET (10 MG TOTAL) BY MOUTH AT BEDTIME.  . pantoprazole (PROTONIX) 40 MG tablet Take 1 tablet (40 mg total) by mouth daily.  . Pseudoephedrine-Ibuprofen (ADVIL COLD & SINUS LIQUI-GELS) 30-200 MG CAPS As needed  . ranitidine (ZANTAC) 75 MG tablet Take 1 tablet (75 mg total) by mouth at bedtime as needed.  . simvastatin (ZOCOR) 40 MG tablet TAKE 1 TABLET (40 MG TOTAL) BY MOUTH AT BEDTIME.  Marland Kitchen thyroid (ARMOUR) 60 MG tablet Take 1 tablet by mouth once daily  . predniSONE (DELTASONE) 20 MG tablet Take as directed   . [DISCONTINUED] ADVAIR DISKUS 500-50 MCG/DOSE AEPB INHALE ONE PUFF BY MOUTH EVERY TWELVE HOURS (Patient not taking: Reported on 01/27/2017)  . [DISCONTINUED] HYDROcodone-homatropine (HYCODAN) 5-1.5 MG/5ML syrup Take 5 mLs by mouth every 6 (six) hours as needed for cough. (Patient not taking: Reported on 01/27/2017)  . [DISCONTINUED] montelukast (SINGULAIR) 10 MG tablet Take 1 tablet (10 mg total) by mouth at bedtime. (Patient not taking: Reported on 01/27/2017)  . [DISCONTINUED] pantoprazole (PROTONIX) 40 MG tablet TAKE 1 TABLET (40 MG TOTAL) BY MOUTH 2 (TWO) TIMES DAILY. (Patient not taking: Reported on 01/27/2017)  . [DISCONTINUED] simvastatin (ZOCOR) 40 MG tablet Take 1 tablet (40  mg total) by mouth at bedtime. (Patient not taking: Reported on 01/27/2017)   Facility-Administered Encounter Medications as of 01/27/2017  Medication  . omalizumab Arvid Right) injection 150 mg  . omalizumab Arvid Right) injection 150 mg  . omalizumab Arvid Right) injection 150 mg  . omalizumab Arvid Right) injection 150 mg    Allergies  Allergen Reactions  . Azithromycin     zpak does not work for the pt    Immunization History  Administered Date(s) Administered  . Tdap 01/21/2011  Shanaiya has declined FLU shots and PNEUMONIA vaccinations...   Current Medications, Allergies, Past Medical History, Past Surgical History, Family History, and Social History were reviewed in [employer] record.    Review of Systems       See HPI - all other systems neg except as noted...      The patient complains of dyspnea on exertion.  The patient denies anorexia, fever, weight loss, weight gain, vision loss, decreased hearing, hoarseness, chest pain, syncope, peripheral edema, prolonged cough, headaches, hemoptysis, abdominal pain, melena, hematochezia, severe indigestion/heartburn, hematuria, incontinence, muscle weakness, suspicious skin lesions, transient blindness, difficulty walking, depression, unusual weight  change, abnormal bleeding, enlarged lymph nodes, and angioedema.     Objective:   Physical Exam      WD, overweight, 61 y/o WF in NAD... GENERAL:  Alert & oriented; & cooperative... HEENT:  [place]/AT, EOM-wnl, PERRLA, Fundi-benign, EACs-clear, TMs-wnl, NOSE-clear, THROAT-clear & wnl. NECK:  Supple w/ full ROM; no JVD; normal carotid impulses w/o bruits; no thyromegaly or nodules palpated; no lymphadenopathy. CHEST:  Clear to P & A; decr BS bilat; without wheezes/ rales/ or rhonchi heard... HEART:  Regular Rhythm; without murmurs/ rubs/ or gallops detected... ABDOMEN:  Soft & nontender; normal bowel sounds; no organomegaly or masses palpated... EXT: without deformities, mild arthritic changes; no varicose veins/ venous insuffic/ or edema. NEURO:  CN's intact; no focal neuro deficits... DERM:  No lesions noted; no rash etc...  RADIOLOGY DATA:  Reviewed in the EPIC EMR & discussed w/ the patient...  LABORATORY DATA:  Reviewed in the EPIC EMR & discussed w/ the patient...   Assessment & Plan:    ASTHMA>  Asked to use the Advair Bid regularly & gauge control by NOT having to use the rescue inhaler... IgE= 68 w/ pos RAST to Cats, Dust, Dog, some molds & we tried for Xolair treatment but unfortunately this could not be arranged due to her insurance...  4/14> try Singulair=> she wants to continue this rx... 4/15> springtime exac due to pollen & treated w/ Depo80, dosepak, Hycodan... 10/15> reminded to take all regular meds REGULARLY! 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 10/16> she presents w/ an exac, only taking Advair Bid & VentolinHFA prn, she stopped the Spiriva & ?if using the Advair every day; we reviewed the need for daily meds- try Advair500Bid & Incruse daily, plus Rx Pred taper... 4/17> still not controlled & Rx hampered by medication compliance; we Rx  w/ Medrol taper + her Advair500 & ProairHFA, needs allergen management & antireflux regimen... 6/17> she feels she is back to baseline; has new [facility] insurance & we will try for Arvid Right again => restarted 05/2016 & improved... 10/17> Anntoinette's medical issues are improved w/ better med compliance & it is hoped that her asthma will improve as well using her inhalers + Xolair (if not reponding then she'd be a cand  for Nucala w/ incr eos);  Continue current rx & the ArmourThyroid60 + add VitD3 ~2000u daily... Note FBS at 117 & she must get on a low carb/ no seets diet & get wt down to avoid DM meds in her future 01/27/17>  Elissia has a long hx of severe difficult to control asthma, she has had a prob w/ medication compliance over the yrs, she has required freq courses of Prednisone in the past;  Since starting on xolair from the manufacturer she has noticed a signif improvement in her asthma control 7 has not had any exac, unfortunately we are unable to get her any more drug w/o her insurance company approval for the [place]- we are continuing our prior authorization process and letter writing campaign to procure this med on her behalf; in the meanwhile she will continue our chronic regimen as above PLUS control of cat dander, antireflux regimen, etc..    HYPERLIPID>  On Simva40 & asked to take it nightly to gauge it's effectiveness; FLP improved w/ more regular dosing; Needs better diet & wt loss...  HYPOTHYROID>  On Armor Thyroid which she insists upon (prev from DrDoerr); TSH looks good when she takes it everyday; Clinically euthyroid, Continue same...  HH/ REFLUX>  Asked to be diligent w/ antireflux measures including the Protonix40Bid & ZantacQhs if nec...  Vit D Deif>  she stopped the 50K treatment on her own; VitD level 10/15= 22 & asked to take 5000u daily OTC supplement...  OVERWEIGHT>  We reviewed diet + exercise needed to lose wt...  Other medical issues as noted...                                      She requested a Pred Rx "to keep on hand" Patient's Medications  New Prescriptions   PREDNISONE (DELTASONE) 20 MG TABLET    Take as directed  Previous Medications   ALBUTEROL (PROVENTIL HFA;VENTOLIN HFA) 108 (90 BASE) MCG/ACT INHALER    Inhale 2 puffs into the lungs every 6 (six) hours as needed for wheezing.   ASPIRIN 81 MG TABLET    Take 81 mg by mouth daily.    CETIRIZINE (ZYRTEC) 10 MG TABLET    Take 10 mg by mouth daily as needed.   CHOLECALCIFEROL (VITAMIN D) 1000 UNITS TABLET    Take 1,000 Units by mouth daily.   FLUTICASONE-SALMETEROL (ADVAIR DISKUS) 500-50 MCG/DOSE AEPB    Inhale one puff by mouth every twelve hours   FUROSEMIDE (LASIX) 20 MG TABLET    Take 1 tablet (20 mg total) by mouth daily as needed for fluid.   MONTELUKAST (SINGULAIR) 10 MG TABLET    TAKE 1 TABLET (10 MG TOTAL) BY MOUTH AT BEDTIME.   PANTOPRAZOLE (PROTONIX) 40 MG TABLET    Take 1 tablet (40 mg total) by mouth daily.   PSEUDOEPHEDRINE-IBUPROFEN (ADVIL COLD & SINUS LIQUI-GELS) 30-200 MG CAPS    As needed   RANITIDINE (ZANTAC) 75 MG TABLET    Take 1 tablet (75 mg total) by mouth at bedtime as needed.   SIMVASTATIN (ZOCOR) 40 MG TABLET    TAKE 1 TABLET (40 MG TOTAL) BY MOUTH AT BEDTIME.   THYROID (ARMOUR) 60 MG TABLET    Take 1 tablet by mouth once daily  Modified Medications   No medications on file  Discontinued Medications   ADVAIR DISKUS 500-50 MCG/DOSE AEPB    INHALE ONE PUFF BY MOUTH  EVERY TWELVE HOURS   HYDROCODONE-HOMATROPINE (HYCODAN) 5-1.5 MG/5ML SYRUP    Take 5 mLs by mouth every 6 (six) hours as needed for cough.   MONTELUKAST (SINGULAIR) 10 MG TABLET    Take 1 tablet (10 mg total) by mouth at bedtime.   PANTOPRAZOLE (PROTONIX) 40 MG TABLET    TAKE 1 TABLET (40 MG TOTAL) BY MOUTH 2 (TWO) TIMES DAILY.   SIMVASTATIN (ZOCOR) 40 MG TABLET    Take 1 tablet (40 mg total) by mouth at bedtime.

## 2017-01-27 NOTE — Patient Instructions (Signed)
Today we updated your med list in our EPIC system...    Continue your current medications the same...  We wrote for some PREDNISONE to keep on hand & use sparingly as needed for your Asthma...  Today we did a follow up CXR...    We will contact you w/ the results when available...   We will continue to press the insurance company to approve your Arvid Right, especially since we know how well it works on your refractory asthma...   Call for any questions...  Let's plan a follow up visit in 76mo, sooner if needed for problems.Marland KitchenMarland Kitchen

## 2017-01-29 ENCOUNTER — Telehealth: Payer: Self-pay | Admitting: Pulmonary Disease

## 2017-01-29 NOTE — Telephone Encounter (Signed)
Spoke with pharmacy and they stated they needed clarification on instructions for pt's prednisone. Spoke with SN and he gave the verbal to take 1 tab a day prn. Called pharmacy and gave them the verbal instructions. They had no further questions. Nothing further is needed

## 2017-01-30 ENCOUNTER — Telehealth: Payer: Self-pay | Admitting: Pulmonary Disease

## 2017-01-30 NOTE — Telephone Encounter (Signed)
Notes recorded by Noralee Space, MD on 01/29/2017 at 4:34 PM EDT Please notify patient>  CXR shows norm heart size, essentially clear lung, NAD.Marland KitchenMarland Kitchen -------------- Spoke with pt, aware of results/recs.  Nothing further needed.

## 2017-02-05 NOTE — Telephone Encounter (Signed)
KW please advise of any updates on this medication for the pt.  thanks

## 2017-02-14 NOTE — Telephone Encounter (Signed)
SN and I are working on Astronomer for patient.

## 2017-02-28 NOTE — Telephone Encounter (Signed)
KW any updates on the appeal letter?  thanks

## 2017-02-28 NOTE — Telephone Encounter (Signed)
No appeal letter given to me by SN; please check with SN as to where he stands on letter. Thanks.

## 2017-03-04 NOTE — Telephone Encounter (Signed)
SN is working on the letter.

## 2017-03-06 NOTE — Telephone Encounter (Signed)
[name]  Do you need [place] to leave this triage message open until the letter is done?

## 2017-03-12 NOTE — Telephone Encounter (Signed)
I will speak with SN today about this letter.

## 2017-03-17 ENCOUNTER — Encounter: Payer: Self-pay | Admitting: Pulmonary Disease

## 2017-03-18 NOTE — Telephone Encounter (Signed)
SN has all the information that he needs for this letter to be completed.  Will sign off of this message at this time.

## 2017-03-18 NOTE — Telephone Encounter (Signed)
[name]- can this be closed yet? Thanks

## 2017-04-03 ENCOUNTER — Telehealth: Payer: Self-pay | Admitting: Pulmonary Disease

## 2017-04-03 NOTE — Telephone Encounter (Signed)
Thanks for the update and will contact [place] tomorrow as well as patient to get set up for Xolair injections again.

## 2017-04-07 ENCOUNTER — Other Ambulatory Visit: Payer: Self-pay | Admitting: Pulmonary Disease

## 2017-04-07 NOTE — Telephone Encounter (Signed)
I contacted [facility] to ask if they triaged the rx to [facility]. They needed to verify that was the right pharm so they transferred me. [place] transferred me a few more times I finally got to the pharm. Who I gave a verbal rx b/c her's ran out. Once the pharmacist processes the rx , office del. Should call me to set up shipment. This will take a few days, leaving encounter open.

## 2017-04-07 NOTE — Telephone Encounter (Signed)
Amanda [name] please contact [employer] and then let patient know of approval,etc. Set up first injections again and epipen Rx. Thanks.

## 2017-04-08 NOTE — Telephone Encounter (Signed)
TS please advise.  

## 2017-04-14 NOTE — Telephone Encounter (Signed)
I called to order pt's xolair, the rep called to get her permission, pt said she going to wait till she meets her deductible. She can't afford the co-pay until she does. Closing encounter. Nothing further needed.

## 2017-04-15 ENCOUNTER — Telehealth: Payer: Self-pay | Admitting: Pulmonary Disease

## 2017-04-15 NOTE — Telephone Encounter (Signed)
#   vials:1 Ordered date:04/15/17 Shipping Date:04/15/17

## 2017-04-16 NOTE — Telephone Encounter (Signed)
#   Vials:1 Arrival Date:04/16/17 Lot #:[id number] Exp Date:11/21

## 2017-04-17 NOTE — Telephone Encounter (Addendum)
So according to Amanda [name] the drug comp.[facility] called her and told her they checked and she's going to be able to get xolair for $5. Pt. Made sure, she told them if not I will sue you. She was going to have to pay $1500 a month until she met her deductible. At any rate, I made her aware her xolair came in and made her an appt. For 04/22/17.

## 2017-04-22 ENCOUNTER — Ambulatory Visit (INDEPENDENT_AMBULATORY_CARE_PROVIDER_SITE_OTHER): Payer: 59

## 2017-04-22 DIAGNOSIS — J4520 Mild intermittent asthma, uncomplicated: Secondary | ICD-10-CM

## 2017-04-22 MED ORDER — OMALIZUMAB 150 MG ~~LOC~~ SOLR
150.0000 mg | SUBCUTANEOUS | Status: DC
  Administered 2017-04-22: 150 mg via SUBCUTANEOUS

## 2017-04-22 NOTE — Progress Notes (Signed)
Documentation of medication administration and charges of Xolair have been completed by Lindsay Lemons, CMA based on the Xolair documentation sheet completed by Tammy Scott.  

## 2017-05-14 ENCOUNTER — Telehealth: Payer: Self-pay | Admitting: Pulmonary Disease

## 2017-05-14 NOTE — Telephone Encounter (Signed)
#   vials:1 Ordered date:05/14/17 Shipping Date:05/19/17

## 2017-05-20 NOTE — Telephone Encounter (Signed)
#   Vials:1 Arrival Date:05/20/17 Lot #:[id number] Exp Date:6/21

## 2017-05-28 ENCOUNTER — Ambulatory Visit (INDEPENDENT_AMBULATORY_CARE_PROVIDER_SITE_OTHER): Payer: 59

## 2017-05-28 DIAGNOSIS — J4540 Moderate persistent asthma, uncomplicated: Secondary | ICD-10-CM

## 2017-05-29 MED ORDER — OMALIZUMAB 150 MG ~~LOC~~ SOLR
150.0000 mg | SUBCUTANEOUS | Status: DC
  Administered 2017-05-28: 150 mg via SUBCUTANEOUS

## 2017-05-29 NOTE — Progress Notes (Signed)
Documentation of medication administration and charges of Xolair have been completed by Bentley Haralson, CMA based on the Xolair documentation sheet completed by Tammy Scott.  

## 2017-06-17 ENCOUNTER — Telehealth: Payer: Self-pay | Admitting: *Deleted

## 2017-06-17 NOTE — Telephone Encounter (Addendum)
#   vials:1 Ordered date:06/17/17 Shipping Date:06/18/17

## 2017-06-24 NOTE — Telephone Encounter (Signed)
#   Vials:1 Arrival Date:06/24/17 Lot #:[id number] Exp Date:11/2020

## 2017-07-01 ENCOUNTER — Ambulatory Visit (INDEPENDENT_AMBULATORY_CARE_PROVIDER_SITE_OTHER): Payer: 59

## 2017-07-01 DIAGNOSIS — J4540 Moderate persistent asthma, uncomplicated: Secondary | ICD-10-CM

## 2017-07-03 MED ORDER — OMALIZUMAB 150 MG ~~LOC~~ SOLR
150.0000 mg | SUBCUTANEOUS | Status: DC
  Administered 2017-07-01: 150 mg via SUBCUTANEOUS

## 2017-07-03 NOTE — Progress Notes (Signed)
Xolair injection documentation and charges entered by Burlie Cajamarca, RMA, based on injection sheet filled out by Tammy Scott per office protocol.   

## 2017-07-25 ENCOUNTER — Telehealth: Payer: Self-pay

## 2017-07-25 NOTE — Telephone Encounter (Signed)
No phone note started when xolair was ordered.  # Vials:1 Arrival Date:07/25/17  Lot #:[id number] Exp Date:12/05/2020

## 2017-07-29 ENCOUNTER — Encounter: Payer: Self-pay | Admitting: Pulmonary Disease

## 2017-07-29 ENCOUNTER — Ambulatory Visit (INDEPENDENT_AMBULATORY_CARE_PROVIDER_SITE_OTHER): Payer: 59

## 2017-07-29 ENCOUNTER — Ambulatory Visit (INDEPENDENT_AMBULATORY_CARE_PROVIDER_SITE_OTHER): Payer: 59 | Admitting: Pulmonary Disease

## 2017-07-29 ENCOUNTER — Other Ambulatory Visit (INDEPENDENT_AMBULATORY_CARE_PROVIDER_SITE_OTHER): Payer: 59

## 2017-07-29 VITALS — BP 116/76 | HR 66 | Ht 65.0 in | Wt 188.1 lb

## 2017-07-29 DIAGNOSIS — E559 Vitamin D deficiency, unspecified: Secondary | ICD-10-CM

## 2017-07-29 DIAGNOSIS — I872 Venous insufficiency (chronic) (peripheral): Secondary | ICD-10-CM | POA: Diagnosis not present

## 2017-07-29 DIAGNOSIS — E782 Mixed hyperlipidemia: Secondary | ICD-10-CM | POA: Diagnosis not present

## 2017-07-29 DIAGNOSIS — M8949 Other hypertrophic osteoarthropathy, multiple sites: Secondary | ICD-10-CM

## 2017-07-29 DIAGNOSIS — E039 Hypothyroidism, unspecified: Secondary | ICD-10-CM

## 2017-07-29 DIAGNOSIS — J302 Other seasonal allergic rhinitis: Secondary | ICD-10-CM

## 2017-07-29 DIAGNOSIS — J4540 Moderate persistent asthma, uncomplicated: Secondary | ICD-10-CM | POA: Diagnosis not present

## 2017-07-29 DIAGNOSIS — E663 Overweight: Secondary | ICD-10-CM

## 2017-07-29 DIAGNOSIS — M150 Primary generalized (osteo)arthritis: Secondary | ICD-10-CM

## 2017-07-29 DIAGNOSIS — K219 Gastro-esophageal reflux disease without esophagitis: Secondary | ICD-10-CM

## 2017-07-29 DIAGNOSIS — M159 Polyosteoarthritis, unspecified: Secondary | ICD-10-CM

## 2017-07-29 LAB — COMPREHENSIVE METABOLIC PANEL
ALT: 15 U/L (ref 0–35)
AST: 14 U/L (ref 0–37)
Albumin: 4.2 g/dL (ref 3.5–5.2)
Alkaline Phosphatase: 81 U/L (ref 39–117)
BUN: 11 mg/dL (ref 6–23)
CO2: 31 mEq/L (ref 19–32)
Calcium: 9.3 mg/dL (ref 8.4–10.5)
Chloride: 103 mEq/L (ref 96–112)
Creatinine, Ser: 0.79 mg/dL (ref 0.40–1.20)
GFR: 78.72 mL/min (ref >60.00)
Glucose, Bld: 106 mg/dL — ABNORMAL HIGH (ref 70–99)
Potassium: 4.1 mEq/L (ref 3.5–5.1)
Sodium: 141 mEq/L (ref 135–145)
Total Bilirubin: 0.5 mg/dL (ref 0.2–1.2)
Total Protein: 6.5 g/dL (ref 6.0–8.3)

## 2017-07-29 LAB — LIPID PANEL
Cholesterol: 145 mg/dL (ref 0–200)
HDL: 51.6 mg/dL (ref >39.00)
LDL Cholesterol: 80 mg/dL (ref 0–99)
NonHDL: 93.84
Total CHOL/HDL Ratio: 3
Triglycerides: 68 mg/dL (ref 0.0–149.0)
VLDL: 13.6 mg/dL (ref 0.0–40.0)

## 2017-07-29 LAB — CBC WITH DIFFERENTIAL/PLATELET
Basophils Absolute: 0.1 10*3/uL (ref 0.0–0.1)
Basophils Relative: 0.9 % (ref 0.0–3.0)
Eosinophils Absolute: 0.5 10*3/uL (ref 0.0–0.7)
Eosinophils Relative: 7.7 % — ABNORMAL HIGH (ref 0.0–5.0)
HCT: 42.6 % (ref 36.0–46.0)
Hemoglobin: 14.1 g/dL (ref 12.0–15.0)
Lymphocytes Relative: 26.7 % (ref 12.0–46.0)
Lymphs Abs: 1.8 10*3/uL (ref 0.7–4.0)
MCHC: 33.1 g/dL (ref 30.0–36.0)
MCV: 92.5 fl (ref 78.0–100.0)
Monocytes Absolute: 0.5 10*3/uL (ref 0.1–1.0)
Monocytes Relative: 8.3 % (ref 3.0–12.0)
Neutro Abs: 3.7 10*3/uL (ref 1.4–7.7)
Neutrophils Relative %: 56.4 % (ref 43.0–77.0)
Platelets: 248 10*3/uL (ref 150.0–400.0)
RBC: 4.61 Mil/uL (ref 3.87–5.11)
RDW: 13 % (ref 11.5–15.5)
WBC: 6.6 10*3/uL (ref 4.0–10.5)

## 2017-07-29 LAB — TSH: TSH: 2.6 u[IU]/mL (ref 0.35–4.50)

## 2017-07-29 LAB — VITAMIN D 25 HYDROXY (VIT D DEFICIENCY, FRACTURES): VITD: 25.24 ng/mL — ABNORMAL LOW (ref 30.00–100.00)

## 2017-07-29 NOTE — Progress Notes (Signed)
Subjective:    Patient ID: Amanda [name], female    DOB: [birth year], 61 y.o.   MRN: [id number]  HPI 61 y/o WF here for a follow up visit... she has multiple medical problems as noted below... she was prev followed by DrWelty-Wolfe at [facility] for her Asthma, then switched to [facility], but both of them have left... I tried to get her to see Dr. Yates Decamp but pt has refused further referrals for her Asthma... the concern has been for the developement of fixed airway obstruction from her Asthma due to poor control and medication non-compliance in the past...  ~  SEE PREV EPIC NOTES FOR OLDER DATA >>    LABS 10/14:  FLP- at goals on Simva40;  Chems- ok x BS=118;  CBC- wnl;  TSH=0.43;  VitD=31 & rec to take 2000u daily...  LABS 10/15:  FLP- not at goal on Simva40 (?compliance);  Chems- ok w/ BS=108;  CBC- wnl x 8%eos;  TSH=6.70 & reminded to take her ArmourThyroid everyday;  VitD=22 & rec OTC VitD5000u daily...  ~  January 13, 2015:  60mo ROV & Amanda [name] reports breathing at baseline but seasonal allergies are kicking in; she also c/o some LBP, thinks it's her bed, offered XRays vs Ortho but she declines, advised heat/ back brace/ Tylenol etc... We reviewed the following medical problems during today's office visit >>     AR, Asthma> on Advair500Bid, ProairHFA prn, & Zyrtek10 (she stopped the Singulair on her own); she is an excellent Xolair cand but they couldn't get it worked out w/ her insurance (copay too $$); hasn't used any Pred in ?58yr...    Her medical management has been repeatedly hampered over the years by medication non-compliance...     Hyperlipid> on Simva40; last FLP 10/15 showed TChol 205, TG 72, HDL 42, LDL 149=> not as good as prev & I suspect compliance issues- asked to take it every eve, + better diet/ exerc/ wt reduction...    Hypothyroid> on ArmourThyroid60-2 tabs/d & she refuses Synthroid Rx; TSH 10/15 = 6.70 & she is clinically euthyroid, again reminded of the need for  100% compliance w/ dosing...    Overweight> wt down to 186# & we reviewed diet, exercise, wt reduction strategies...    HH, Reflux> on Protonix40Bid + Zantac75 as needed; she knows to use max antireflux regimen w/ elev HOB, NPO after dinner, etc...    GYN> prev seen by [facility] but she stopped going when they didn't help her hot flashes; she is again advised to estab w/ another GYN for Pelvic, Pap, Mammography, BMD, etc (she still has not complied w/ this request)...     DJD> hx left knee pain & arthroscopy 1999 by DrPaul; she uses OTC analgesics as needed...    VitD defic> intermittently on VitD50K weekly from [place]; stopped on her own- took OTC VitD supplement but she stopped that too; last VitD level 10/14 = 31 & asked to resume daily vit supplements (never did- asked again)...    Anxiety> offered Alpraz or similar Benzo in past but she declines med rx... 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 PLAN>>  We discussed adding Spiriva Respimat- 2sp daily due to her Spirometry report; continue REGULAR DOSING of all meds including Advair500Bid; she refuses to  take Mucinex, refuses nasal sprays, refuses to sched w/ Gyn...  ~  July 18, 2015:  60mo ROV & Amanda [name] presents w/ recent incr SOB, cough, wheezing & chest tightness which she thinks is related to "weather changes and allergies";  Notes baseline DOE w/ "a bunch of stuff" like housework, taking out the trash, etc; we have discussed need for diet, exercise, wt reduction on mult occais but she has been unable to comply...    Asthma/ AB> on Advair500Bid, VentolinHFA prn (uses thisQid recently); she hasn't had Pred in a long time & stopped Spiriva & Singulair on her own; I have rec that she add INCRUSE one inhalation daily &  Pred20mg - tapering schedule...    HL> supposed to be on Simva40; [place] 10/16 shows TChol 219, TG 86, HDL 51, LDL 151 and a call to her Pharm indicates no recent refills of this med- we discussed taking the stain med every day, regularly, preferrably Qhs...    Hypothyroid> supposed to be on her Armour Thyroid 60-2tabs daily; LABS 07/18/15 showed TSH=10.51 and she is reminded to take it every day so we can determine her optimal dose...    Overweight>  Weight today is 188# up 2# & we reviewed diet/ exercise/ wt reduction...    Vit D defic>  Supposed to be on OTC Vit D supplement daily but she declines to take it regularly; LABS 07/2015 shows VitD level = 17 and asked to restart VitD 50K weekly dosing...  EXAM shows Afeb, VSS, O2sat=96% on RA;  HEENT- neg, mallampati2;  Chest- mild exp wheezing, scat rhonchi, no rales or consolidation;  Heart- RR w/o m/r/g;  Abd- soft, nontender, neg;  Ext- w/o c/c/e;  Neuro- intact...  LABS 10/17/14>  FLP- not at goals but she hasn't filled the Simva40 in several months;  Chems- wnl x BS=112;  CBC- wnl;  TSH=10.51;  VitD=16.6.Marland KitchenMarland Kitchen  IMP/PLAN>>  Amanda [name] has an AB exac & we will treat w/ Pred tapering sched but she needs to take her regular meds daily as maintenance, we discussed adding INCRUSE to her Advair;  Her LABS also reflect poor compliance w/ medications and she PROMISES to do better...  ~  January 16, 2016:  60mo ROV & Amanda [name] reports increased stress as her sister passed away in 2022-11-25 w/ lung cancer [name]- never smoked, treated in [place]);  She also notes that her asthma has been acting up lately and that her cat is ill- now required to live inside w/ resultant incr asthma symptoms as well; c/o nasal drainage, dry hacking cough, chest tight & wheezing, notes SOB/DOE w/ regular walking, mild cough & min beige sput; she states she had a weird reaction to Doxy prev & refuses to take this antibiotic again...     Allergies & cat now resides indoors>  We reviewed need for  antihist, Flonase, saline, and a good electrostatic air cleaner in her bedroom...    Asthma/ AB>  On Advair500Bid, ProairHFA- using it Bid, not taking the INCRUSE (she says "didn't have success w/ Spiriva or Incruse"), has Tessalon & Hycodan for prn use;       HL>  Says she is doing the simva40 daily now but not fasting today...    Hypothy>  She says she is taking the Armour thyroid60-2 Qam regularly; TSH today 01/16/16 = 0.10 indicating over-replaced when taking 120mg  every day- REC to decr to 60mg  daily...    Anxiety>  Her sister passed away, her cat is ill & living indoors now affecting  her allergies & asthma acting up; offered Alpraz or similar rx but she declines... EXAM shows Afeb, VSS, O2sat=95% on RA;  HEENT- neg, mallampati2;  Chest- mild exp wheezing, scat rhonchi, no rales or consolidation;  Heart- RR w/o m/r/g;  Abd- soft, nontender, neg;  Ext- w/o c/c/e;  Neuro- intact...  CXR 01/16/16>  Norm heart size, clear lungs w/ mild left basilar atx, DJD Tspine, NAD...   Spirometry 01/16/16>  FVC=1.88 (58%), FEV1=1.10 (43%), %1sec=58%, mid-flows reduced at 18% predicted;  This is c/o a mod obstructive ventilatory defect  And GOLD Stage3 COPD...]  LABS 01/16/16>  TSH= 0.10 on the Armour thyroid 60mg - 2/d... This is oversuppressed & needs to back off to 60mg /d. IMP/PLAN>>  Amanda [name] will not take any additional inhalers so we will Rx w/ Medrol 8mg  slow wean + her Advair500Bid & ProairHFA rescue;  She will re-double her efforts at allergy Rx w/ Antihist Qam, Flonase Bid, Saline as needed & get the electrostatic air cleaner for her bedroom;  Finally add vigorous antireflux regimen w/ Protonix40 before dinner, NPO after dinner (she refuses stating "I drink stuff"), Elev HOB 6"... Rov in ~6weeks recheck.  ~  March 19, 2016:  27mo ROV & Amanda [name] notes that her breathing is back to baseline- she notes that she has new insurance now [facility] [employer]) & I feel it would be helpful to re-submit request for [facility] for her  Asthma, she is in agreement as a prev shot seemed to help but her insurance copay was prohibitive in past... Her CC is nocturnal leg pain x several weeks- down left leg & she denies injury, no LBP, & the pain is NOT present when she is on Medrol, OTC Advil w/ sl benefit as well; she notes some swelling as well; we decided to try Etodolac400Bid first- plus low sodium/ elevation/ support hose & consider further eval if not responding...     Allergies & cat now resides indoors>  We reviewed need for Antihist, Flonase, Saline, and a good electrostatic air cleaner in her bedroom...    Asthma/ AB>  On Advair500Bid, Singulair10, ProairHFA- using it Bid, not taking the INCRUSE (she says "didn't have success w/ Spiriva or Incruse"), has Tessalon & Hycodan for prn use;       HL>  Says she is doing the Simva40 daily now but not fasting today...    Hypothy>  She says she is taking the Armour Thyroid60-2 Qam regularly; TSH 01/16/16 = 0.10 indicating over-replaced when taking 120mg  every day- REC to decr to 60mg  daily...    Anxiety>  Her sister passed away 19-Nov-2022- lung cancer), her cat is ill & living indoors now affecting her allergies & asthma acting up; offered Alpraz or similar rx but she declines... EXAM shows Afeb, VSS, O2sat=95% on RA;  HEENT- neg, mallampati2;  Chest- mild exp wheezing, scat rhonchi, no rales or consolidation;  Heart- RR w/o m/r/g;  Abd- soft, nontender, neg;  Ext- w/o c/c/e;  Neuro- intact... IMP/PLAN>>  We decided to try the Etodolac400bid before getting additional w/u for the leg pain; rec to elim sodium/ elev legs/ use support hose of the swelling; we will request her new insurance [employer] to approve XOLAIR for her severe asthma...  ~  July 25, 2016:  45mo ROV & Amanda [name] is here for a pulmonary recheck & general follow up visit>  During her last visit we prescribed Lodine400 to try for her leg pain before considering further eval- pt tells me that the Pharm hand-out said this  drug could cause  cancer so she never filled the Rx & didn't call to discuss or pick an alternative; she fell in June & injured her right ankle- chose to see a podiatrist s/p 4 visits and eventually given Lasix20 prn swelling, states her pain is pretty much gone now... we reviewed the following medical problems during today's office visit >>     Her medical management has been repeatedly hampered over the years by medication non-compliance...     AR> on OTC antihist prn, Singulair10/d; prev RAST testing 10/13 showed IgE=68, Rast panel w/ CAT>>dust>mold>dog; she has cats and asked to exclude them from the bedroom & set up air cleaner etc but she has not done this.    AR, Asthma> on Advair500Bid (but only taking once per day), Singulair10, ProairHFA prn (hasn't needed); she started XOLAIR in Aug2017- monthly injections; she hasn't used any Pred in ?68yr; unfortunately her PFTs showed mod airflow obstruction which I believe is fixed due to her poorly controlled asthma x many yrs; she has refused adding an anticholinergic like Spiriva or Incruse to her regimen...    LPR> on Protonix40 (declined to incr to Bid), Zantac Qhs (not taking); she was asked to elev [facility] & NPO after dinner but she has not complied w/ these requests either...    Hyperlipid> on Simva40; FLP 10/17 shows TChol 159, TG 64, HDL 51, LDL 95=> looks great w/ better Simva40 compliance + better diet/ exerc/ wt reduction...    Hypothyroid> she was on ArmourThyroid60-2 tabs/d & she refuses Synthroid Rx; TSH 4/17 = 0.10 & we rec decr to one 60gr tab daily; f/u TSH 10/17 on 60gr/d shows TSH= 0.54, continue same.    Overweight> wt stable at 188# & BMI=31-2; we reviewed diet, exercise, wt reduction strategies...    HH, Reflux> on Protonix40 + Zantac75 as needed; she knows to use max antireflux regimen w/ elev HOB, NPO after dinner, etc...    GYN> prev seen by [facility] but she stopped going when they didn't help her hot flashes; she is again advised to estab w/ another GYN  for Pelvic, Pap, Mammography, BMD, etc (she still has not complied w/ this request)...     DJD, ven insuffic> hx left knee pain & arthroscopy 1999 by DrPaul; she uses OTC analgesics as needed; mild VI & rec to elim salt, elev legs, wear support hose; she has Lasix20 prn edema...    VitD defic> intermittently on VitD50K weekly from [place]; stopped on her own- took OTC VitD supplement but she stopped that too; VitD level 10/17 = 32 & asked to resume daily vit supplements...    Anxiety> offered Alpraz or similar Benzo in past but she declines med rx... EXAM shows Afeb, VSS, O2sat=97% on RA;  HEENT- neg, mallampati2;  Chest- decr BS bilat but clear w/o w/r/r;  Heart- RR w/o m/r/g;  Abd- soft, min epig tender, neg;  Ext- w/o c/c/e;  Neuro- intact...  LABS 07/25/18>  FLP- all parameters at goals on Simva40;  Chems- ok x FBS=117 & we discussed diet/ wt reduction;  CBC- wnl x 8% eos;  TSH=0.54;  VitD=32... IMP/PLAN>> Amanda [name]'s medical issues are improved w/ better med compliance & it is hoped that her asthma will improve as well using her inhalers + Xolair (if not reponding then she'd be a cand for Nucala w/ incr eos);  Continue current rx & the ArmourThyroid60 + add VitD3 ~2000u daily... Note FBS at 117 & she must get on a low  carb/ no seets diet & get wt down to avoid DM meds in her future...  ~  January 27, 2017:  62mo ROV & Amanda [name]'s last Xolair shot was 10/31/16 as we continued to try & get insurance company approval [facility]) for this medication;  She has severe life-long asthma, never smoked, and has developed a component of fixed airway obstruction due to her severe asthma over many yrs;  We were able to try the Xolair & she has reported a fabulous response to this medication w/o exacerbations/ asthma attacks/ etc while on the drug;  Since she has been off she notes her symptoms have worsened w/ cough, sneezing, drainage, watery eyes, small amt of beige sput, no hemoptysis, no CP, but increased SOB-  progressive to dyspneic w/ min activ;  She is reminded to stay on her prescribed regimen of> ADVAIR500Bid, SINGULAIR10/d, ZYRTEK10/d, PROAIR-HFA rescue inhaler prn (she's been using this 4x per day recently), she refuses LAMA rx (we tried Spiriva & Incruse but compliance was poor & she felt benefit was min); Note- last Prednisone usage was >45yr ago she says... We reviewed above problem list & meds...    EXAM shows Afeb, VSS, O2sat=95% on RA;  Wt=187#, BMI=31-2;  HEENT- neg, mallampati2;  Chest- decr BS bilat but clear w/o w/r/r;  Heart- RR w/o m/r/g;  Abd- soft, min epig tender, neg;  Ext- w/o c/c/e;  Neuro- intact...  CXR 01/27/17 (independently reviewed by me in the PACS system) showed norm heart size, clear lungs, NAD... IMP/PLAN>>  Amanda [name] has a long hx of severe difficult to control asthma, she has had a prob w/ medication compliance over the yrs, she has required freq courses of Prednisone in the past;  Since starting on xolair from the manufacturer she has noticed a signif improvement in her asthma control 7 has not had any exac, unfortunately we are unable to get her any more drug w/o her insurance company approval for the [place]- we are continuing our prior authorization process and letter writing campaign to procure this med on her behalf; in the meanwhile she will continue our chronic regimen as above PLUS control of cat dander, antireflux regimen, etc...     NOTE: >50% of this 42min ov was spent in counseling & coordination of care...   ~  July 29, 2017:  67mo ROV & Amanda [name] reports that her breathing is doing well on her [place] Right monthly- she denies cough, sputum, hemoptysis, CP, and notes SOB is stable- does yard & housework w/o change from her baseline; still not exercising! We reviewed the following medical problems during today's office visit >>     AR> on OTC antihist prn, Singulair10/d; prev RAST testing 10/13 showed IgE=68, Rast panel w/ CAT>>dust>mold>dog; she has cats and asked to exclude  them from the bedroom & set up air cleaner etc but she has not done this.    AR, Asthma> on Advair500Bid (but only taking once per day), Singulair10, ProairHFA prn (hasn't needed); she started XOLAIR in Aug2017- monthly injections; she hasn't used any Pred in ?33yr; unfortunately her PFTs showed mod airflow obstruction which I believe is fixed due to her poorly controlled asthma x many yrs; she has refused adding an anticholinergic like Spiriva or Incruse to her regimen...    LPR> on Protonix40 (declined to incr to Bid), Zantac Qhs (not taking); she was asked to elev [facility] & NPO after dinner but she has not complied w/ these requests either...    Hyperlipid> on Simva40; [place] 10/18 shows TChol  145, TG 68, HDL 52, LDL 80=> looks great w/ better Simva40 compliance + better diet/ exerc/ wt reduction...    Hypothyroid> she was on ArmourThyroid60-1 tab/d & she refuses Synthroid Rx; TSH 10/18 = 2.60 & rec to continue the same...    Overweight> wt stable at 188# & BMI=31-2; we reviewed diet, exercise, wt reduction strategies...    HH, Reflux> on Protonix40 + Zantac75 HS as needed; she knows to use max antireflux regimen w/ elev HOB, NPO after dinner, etc... She has refused GI referral for colonoscopy...    GYN> prev seen by [facility] but she stopped going when they didn't help her hot flashes; she is again advised to estab w/ another GYN for Pelvic, Pap, Mammography, BMD, etc (she still has not complied w/ this request)...     DJD, ven insuffic> hx left knee pain & arthroscopy 1999 by DrPaul; she uses OTC analgesics as needed; mild VI & rec to elim salt, elev legs, wear support hose; she has Lasix20 prn edema...    VitD defic> intermittently on VitD50K weekly from [place]; stopped on her own- took OTC VitD supplement but she stopped that too; VitD level 10/17 = 32 & asked to resume daily vit supplements...    Anxiety> offered Alpraz or similar Benzo in past but she declines med rx... EXAM shows Afeb,  VSS, O2sat=97% on RA;  Wt=188#, BMI=31-2;  HEENT- neg, mallampati2;  Chest- decr BS bilat but clear w/o w/r/r;  Heart- RR w/o m/r/g;  Abd- soft, min epig tender, neg;  Ext- w/o c/c/e;  Neuro- intact...  LABS 07/29/17>  FLP- all parameters at goals on Simva40;  Chems- wnl x BS=106;  CBC- wnl x eos=7.7%;  TSH=2.60;  VitD=25 (30-100)... IMP/PLAN>>  Amanda [name] is overall stable on her Advair, Singulair, Xolair- continue same;  Needs better diet, wt reduction, & exercise program- suggest gym/ [employer]/ etc;  Labs look good- needs low carb wt reducing diet, rec to incr VitD supplement;  She needs GYN follow up w/ mammogram, PAP, BMD;  She needs GI for colonoscopy...          Problem List:  ALLERGIC RHINITIS (ICD-477.9)  ++allergy testing by DrESL in the 1980's w/ reactions to grasses, ragweed, dust, molds, cats, dog... prev on shots... ~  10/12:  She reports a new kitten at home, but denies allergy or asthma exac so far... ~  10/13:  I cannot find documentation of IgE/ RAST testing (?done at [facility] in the past, she was in a drug trial 2006 at [facility]); we sent labs today> IgE=  68 & RAST pos for Cat> Dust> Dog> molds...  We decided to apply for [facility] Rx => they could not secure payment from her insurance... ~  10/14:  on Zyrtek, Singulair10, Advair500, AlbutHFA prn; breathing has been good, allergies acting up & she has dermatitis on face- wants Dosepak- ok ~  4/15: presents c/o spring allergy symptoms on Singulair10 & Zyrtek OTC, refuses nasal sprays; Rec Depo80, Pred dosepak, etc... ~  4/16: this spring hasn't been so bad, yet; she stopped the Singulair on her own, won't use any nasal sprays; advised to call if round of Pred needed... ~  4/17: on OTC antihist prn, Singulair10/d; prev RAST testing 10/13 showed IgE=68, Rast panel w/ CAT>>dust>mold>dog; she has cats and asked to exclude them from the bedroom & set up air cleaner etc but she has not done this.  ASTHMA (ICD-493.90) - on ADVAIR 500Bid, & VENTOLIN HFA as  needed... she  admits to using Advair once dailly & is encouraged to take meds regularly as perscribed; similarly she overuses the rescue inhaler & admonished to not use it more than Qid... ~  baseline CXR shows sl incr markings, NAD... ~  PFT's 5/05 showed FVC= 2.47 (76%), FEV1= 1.51 (56%), %1sec=61, mid-flows= 22%pred... ~  in 2006 she entered the EXTRA trial at [facility] which was a double blind trial testing XOLAIR... ~  last note from [facility] 6/07 DrCarraway w/ concern for her fixed airway obstruction... pt refused f/u in the [place]... ~  f/u PFT 10/11 showed FVC= 2.52 (71%), FEV1= 1.69 (60%), %1sec=67, mid-flows= 38%pred. ~  CXR 10/11 showed clear lungs, NAD... ~  10/13:  Treated for exac w/ Pred course; rechecked labs including IgE= 68 & RAST pos for Cat> Dust> Dog> molds... We decided to apply for [facility] Rx but they could not arrange payment from her insurance... She states that the 1st shot really helped... ~  10/14: on Zyrtek, Singulair10, Advair500, AlbutHFA prn; breathing has been good, allergies acting up & she has dermatitis on face- wants Dosepak- ok. ~  4/15: on Zyrtek10, Advair500Bid, Singulair10, ProairHFA prn (lately incr to qid); she is an excellent Xolair cand but they couldn't get it worked out w/ her insurance; hasn't used any Pred in >57mo til this visit w/ spring pollen. ~  10/15: on same meds but only doing the Advair once/d & not regular w/ the Singulair; as a result she continues to use the rescue inhaled 1-2/wk she says; we discussed taking meds regularly... 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 ~  05/2016> she has restarted the Xolair due to better coverage from her [facility] insurance...  HYPERLIPIDEMIA (ICD-272.4) - prev followed by DrDoerr, Endocrinology in [place]... prev on Zetia, now on SIMVASTATIN 40mg /d but admits to intermittent dosing. ~  4/11: pt presents w/ request to start writing Simva Rx & monitor labs since DrDoerr has left [place]... encouraged to take med regularly & follow low chol/ low fat diet... ~  FLP 10/11 on Simva40 showed TChol 198, TG 110, HDL 46, LDL 131 ~  FLP 10/12 on Simva40 showed TChol 172, TG 82, HDL 51, LDL 105... Improved, reminded of diet & compliance. ~  FLP 10/13 on Simva40 (not taking daily) showed TChol 229, TG 60, HDL 48, LDL 170==> rec to take med daily & refer to [facility]! ~  FLP 10/14 on Simva40 showed TChol 138, TG 78, HDL 42, LDL 81... Great job, continue daily dosing... ~  [place] 10/15 on Simva40 (?compliance) showed TChol 205, TG 72, HDL 42, LDL 149... Reminded to take it everyday, low fat diet & get the wt down. ~  FLP 10/16 on Simva40 ?compliance showed TChol 219, TG 86, HDL 51, LDL 151... Discussed taking it everyday! ~  [place] 10/17 on simva40 w/ better compliance sowed TChol 159, TG 64, HDL 51, LDL 95 & this is much improved.Marland KitchenMarland Kitchen  HYPOTHYROIDISM (ICD-244.9) - taking ARMOUR THYROID 60mg - 2tabs daily... she was followed by DrDoerr, Endocrine in [place] for thyroid and her hyperlipidemia... she was prev on Levothyroid and had requested change to Armour Thyroid because she believed the Levothy was making her hair fall out... ~  4/11:  pt reqested that we write her Rx for the Armour Thyroid 60mg  since DrDoerr has left [place]... pt is uncouraged to take the med daily. ~  labs 10/11 showed TSH= 4.69 ~  Labs 10/12 on ArmourThyroid60-2/d showed TSH= 2.88... rec  to continue same. ~  Labs 10/13 on ArmourThyroid60-2/d (not taking daily) showed TSH= 5.91==> rec to take med every day!!! ~  Labs 10/14 on ArmourThyroid60-2/d showed TSH=0.43 ~  Labs 10/15 on ArmourThyroid60-2/d showed TSH=6.70 and reminded to take all meds every day... ~  Labs 10/16 on Armour Thyroid60-2/d showed TSH=10.51 and we again discussed taking the med every day, 1st thing in AM etc... ~  Labs 4/17 on Armour Thyroid60-2/d & she assures me taking it regularly showed TSH=0.10! Therefore decr to one tab per day... ~  Labs 10/17 on Armour thyroid60/d showed TSH= 0.54 & rec to continue the same...  OVERWEIGHT (ICD-278.02) - we reviewed diet + exercise program required to lose weight... ~  weight 9/10 = 200#,  she is 5\' 5"  tall,  BMI= 34. ~  weight 4/11 = 192#,  this is as good as she's been in several yrs. 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 she continues to treat her reflux as needed w/ the Nexium and OTC Zantac even though we have felt this plays a signif roll in her Asthma... the consultants at [facility] agreed, but were no more effective at getting her to take her meds regularly... 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 ~  meds re-written & encouraged to take PROTONIX 40mg /d- 30 min before the 1st meal, and Zantac 75- 1-2 at bedtime... ~  4/14: she requests switch to NEXIUM40 from the Protonix... ~  She has been poorly compliant w/ her antireflux regimen...  GYN = DrRomaine in the past... pt states that she stopped going because she couldn't help her w/ her hot flashes. ~  4/14: reminded of the  importance of regular Gyn checks- pelvic, Pap, Mammograms, etc...  OSTEOARTHRITIS (ICD-715.90) - she had a left knee arthroscopy by DrPaul in 1999...  VITAMIN D DEFICIENCY (ICD-268.9) - pt was on Vit D 50,000 u weekly from DrDoerr... we don't have prev labs from her but pt indicates that the Vit D level was "really really low" so we will refill the Rx & f/u Vit D level later. ~  labs 10/11 showed Vit D level = 44... OK to switch to 1-2000u daily OTC supplement. ~  4/12:  Pt requested to go back on Vit D 50000u weekly... ~  10/12:  Pt indicated that she stopped filling the Rx for Vit D 50K weekly, ?why? ~  BMD 5/14 was wnl w/ lowest Tscore = -0.2.Marland KitchenMarland Kitchen ~  10/14:  she stopped the VitD 50K weekly Rx on her own; Labs 10/14 showed VitD level = 31 & she is requested to take 2000u OTC supplement daily  ~  Labs 10/15 showed VitD level = 22 and she is rec to take OTC VitD supplement ~5000u daily... ~  Pt reminded to take all meds regularly + OTC VitD supplements... ~  Labs 10/17 ?on VitD 1000u daily showed VitD level = 32... rec to incr to 2000u OTC VitD3 supplement daily...  ANXIETY (ICD-300.00)  Hx of SHINGLES (ICD-053.9) - she presented w/ cryptic left flank pain 12/09 that turned out to be Shingles... treated w/ Valtrex, Pred, Vicodin.  HEALTH MAINTENANCE:   ~  GI:  She has not established w/ a GI for routine screening colonoscopy- reminded again about this important screening procedure! ~  GYN:  GYN = DrRomaine in the past... pt states that she stopped going because she couldn't help her w/ her hot flashes; she is reminded of the need to establish w/ another GYN for PAP, Mammograms, BMD etc... ~  Immuniz:  she refuses Pneumovax & Flu vaccine... She received TDAP 2012 prior to a trip to [place].   Past Surgical History:  Procedure Laterality Date  . breast reductions  2000   in [place]  . left knee arthroscopy  1999   by Dr. Eddie Dibbles    Outpatient Encounter Prescriptions as of 07/29/2017   Medication Sig  . albuterol (PROVENTIL HFA;VENTOLIN HFA) 108 (90 Base) MCG/ACT inhaler Inhale 2 puffs into the lungs every 6 (six) hours as needed for wheezing.  Marland Kitchen aspirin 81 MG tablet Take 81 mg by mouth daily.   . cetirizine (ZYRTEC) 10 MG tablet Take 10 mg by mouth daily as needed.  . cholecalciferol (VITAMIN D) 1000 units tablet Take 1,000 Units by mouth daily.  . Fluticasone-Salmeterol (ADVAIR DISKUS) 500-50 MCG/DOSE AEPB Inhale one puff by mouth every twelve hours  . furosemide (LASIX) 20 MG tablet Take 1 tablet (20 mg total) by mouth daily as needed for fluid.  . montelukast (SINGULAIR) 10 MG tablet TAKE 1 TABLET (10 MG TOTAL) BY MOUTH AT BEDTIME.  . predniSONE (DELTASONE) 20 MG tablet Take as directed  . Pseudoephedrine-Ibuprofen (ADVIL COLD & SINUS LIQUI-GELS) 30-200 MG CAPS As needed  . ranitidine (ZANTAC) 75 MG tablet Take 1 tablet (75 mg total) by mouth at bedtime as needed.  . simvastatin (ZOCOR) 40 MG tablet TAKE 1 TABLET (40 MG TOTAL) BY MOUTH AT BEDTIME.  Marland Kitchen thyroid (ARMOUR) 60 MG tablet Take 1 tablet by mouth once daily  . XOLAIR 150 MG injection INJECT 150MG  SUBCUTANEOUSLY ONCE EVERY 4 WEEKS  . pantoprazole (PROTONIX) 40 MG tablet Take 1 tablet (40 mg total) by mouth daily.    Allergies  Allergen Reactions  . Azithromycin     zpak does not work for the pt    Immunization History  Administered Date(s) Administered  . Tdap 01/21/2011  Patt has declined FLU shots and PNEUMONIA vaccinations...   Current Medications, Allergies, Past Medical History, Past Surgical History, Family History, and Social History were reviewed in [employer] record.    Review of Systems       See HPI - all other systems neg except as noted...      The patient complains of dyspnea on exertion.  The patient denies anorexia, fever, weight loss, weight gain, vision loss, decreased hearing, hoarseness, chest pain, syncope, peripheral edema, prolonged cough, headaches,  hemoptysis, abdominal pain, melena, hematochezia, severe indigestion/heartburn, hematuria, incontinence,  muscle weakness, suspicious skin lesions, transient blindness, difficulty walking, depression, unusual weight change, abnormal bleeding, enlarged lymph nodes, and angioedema.     Objective:   Physical Exam      WD, overweight, 60 y/o WF in NAD... GENERAL:  Alert & oriented; & cooperative... HEENT:  [place]/AT, EOM-wnl, PERRLA, Fundi-benign, EACs-clear, TMs-wnl, NOSE-clear, THROAT-clear & wnl. NECK:  Supple w/ full ROM; no JVD; normal carotid impulses w/o bruits; no thyromegaly or nodules palpated; no lymphadenopathy. CHEST:  Clear to P & A; decr BS bilat; without wheezes/ rales/ or rhonchi heard... HEART:  Regular Rhythm; without murmurs/ rubs/ or gallops detected... ABDOMEN:  Soft & nontender; normal bowel sounds; no organomegaly or masses palpated... EXT: without deformities, mild arthritic changes; no varicose veins/ venous insuffic/ or edema. NEURO:  CN's intact; no focal neuro deficits... DERM:  No lesions noted; no rash etc...  RADIOLOGY DATA:  Reviewed in the EPIC EMR & discussed w/ the patient...  LABORATORY DATA:  Reviewed in the EPIC EMR & discussed w/ the patient...   Assessment & Plan:    ASTHMA>  Asked to use the Advair Bid regularly & gauge control by NOT having to use the rescue inhaler... IgE= 68 w/ pos RAST to Cats, Dust, Dog, some molds & we tried for Xolair treatment but unfortunately this could not be arranged due to her insurance...  4/14> try Singulair=> she wants to continue this rx... 4/15> springtime exac due to pollen & treated w/ Depo80, dosepak, Hycodan... 10/15> reminded to take all regular meds REGULARLY! 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 10/16> she presents w/ an exac, only taking Advair Bid & VentolinHFA prn, she stopped  the Spiriva & ?if using the Advair every day; we reviewed the need for daily meds- try Advair500Bid & Incruse daily, plus Rx Pred taper... 4/17> still not controlled & Rx hampered by medication compliance; we Rx w/ Medrol taper + her Advair500 & ProairHFA, needs allergen management & antireflux regimen... 6/17> she feels she is back to baseline; has new [facility] insurance & we will try for Arvid Right again => restarted 05/2016 & improved... 10/17> Royalty's medical issues are improved w/ better med compliance & it is hoped that her asthma will improve as well using her inhalers + Xolair (if not reponding then she'd be a cand for Nucala w/ incr eos);  Continue current rx & the ArmourThyroid60 + add VitD3 ~2000u daily... Note FBS at 117 & she must get on a low carb/ no seets diet & get wt down to avoid DM meds in her future 01/27/17>  Kendelle has a long hx of severe difficult to control asthma, she has had a prob w/ medication compliance over the yrs, she has required freq courses of Prednisone in the past;  Since starting on xolair from the manufacturer she has noticed a signif improvement in her asthma control 7 has not had any exac, unfortunately we are unable to get her any more drug w/o her insurance company approval for the Xenia- we are continuing our prior authorization process and letter writing campaign to procure this med on her behalf; in the meanwhile she will continue our chronic regimen as above PLUS control of cat dander, antireflux regimen, etc.. 07/29/17>  Amanda [name] is overall stable on her Advair, Singulair, Xolair- continue same;  Needs better diet, wt reduction, & exercise program- suggest gym/ [employer]/ etc;  Labs  look good- needs low carb wt reducing diet, rec to incr VitD supplement;  She needs GYN follow up w/ mammogram, PAP, BMD;  She needs GI for colonoscopy   HYPERLIPID>  On Simva40 & asked to take it nightly to gauge it's effectiveness; FLP improved w/ more regular dosing; Needs better diet & wt  loss...  HYPOTHYROID>  On Armor Thyroid which she insists upon (prev from DrDoerr); TSH looks good when she takes it everyday; Clinically euthyroid, Continue same...  HH/ REFLUX>  Asked to be diligent w/ antireflux measures including the Protonix40Bid & ZantacQhs if nec...  Vit D Deif>  she stopped the 50K treatment on her own; VitD level 10/15= 22 & asked to take 5000u daily OTC supplement...  OVERWEIGHT>  We reviewed diet + exercise needed to lose wt...  Other medical issues as noted...                                     She requested a Pred Rx "to keep on hand" Patient's Medications  New Prescriptions   No medications on file  Previous Medications   ALBUTEROL (PROVENTIL HFA;VENTOLIN HFA) 108 (90 BASE) MCG/ACT INHALER    Inhale 2 puffs into the lungs every 6 (six) hours as needed for wheezing.   ASPIRIN 81 MG TABLET    Take 81 mg by mouth daily.    CETIRIZINE (ZYRTEC) 10 MG TABLET    Take 10 mg by mouth daily as needed.   CHOLECALCIFEROL (VITAMIN D) 1000 UNITS TABLET    Take 1,000 Units by mouth daily.   FLUTICASONE-SALMETEROL (ADVAIR DISKUS) 500-50 MCG/DOSE AEPB    Inhale one puff by mouth every twelve hours   FUROSEMIDE (LASIX) 20 MG TABLET    Take 1 tablet (20 mg total) by mouth daily as needed for fluid.   MONTELUKAST (SINGULAIR) 10 MG TABLET    TAKE 1 TABLET (10 MG TOTAL) BY MOUTH AT BEDTIME.   PANTOPRAZOLE (PROTONIX) 40 MG TABLET    Take 1 tablet (40 mg total) by mouth daily.   PREDNISONE (DELTASONE) 20 MG TABLET    Take as directed   PSEUDOEPHEDRINE-IBUPROFEN (ADVIL COLD & SINUS LIQUI-GELS) 30-200 MG CAPS    As needed   RANITIDINE (ZANTAC) 75 MG TABLET    Take 1 tablet (75 mg total) by mouth at bedtime as needed.   SIMVASTATIN (ZOCOR) 40 MG TABLET    TAKE 1 TABLET (40 MG TOTAL) BY MOUTH AT BEDTIME.   THYROID (ARMOUR) 60 MG TABLET    Take 1 tablet by mouth once daily   XOLAIR 150 MG INJECTION    INJECT 150MG  SUBCUTANEOUSLY ONCE EVERY 4 WEEKS  Modified Medications   No  medications on file  Discontinued Medications   No medications on file

## 2017-07-29 NOTE — Patient Instructions (Signed)
Today we updated your med list in our EPIC system...    Continue your current medications the same...    Continue the XOLAIR shots monthly...  Today we checked your follow up fasting blood work...    We will contact you w/ the results when available...   We discussed trying to add-in some regular exercise like walking...  Call for any questions...  Let's plan a follow up visit in 40mo, sooner if needed for problems.Marland KitchenMarland Kitchen

## 2017-07-30 MED ORDER — OMALIZUMAB 150 MG ~~LOC~~ SOLR
150.0000 mg | SUBCUTANEOUS | Status: DC
  Administered 2017-07-29: 150 mg via SUBCUTANEOUS

## 2017-07-30 NOTE — Progress Notes (Signed)
Documentation of medication administration and charges of Xolair have been completed by Veyda Kaufman, CMA based on the Xolair documentation sheet completed by Tammy Scott.  

## 2017-07-31 ENCOUNTER — Telehealth: Payer: Self-pay | Admitting: Pulmonary Disease

## 2017-07-31 NOTE — Telephone Encounter (Signed)
#   vials:1 Ordered date:07/31/17 Shipping Date:08/13/17 faxed reorder form and request xolair be sent about a week and half before pt.'s appt.Amanda [name]

## 2017-08-01 ENCOUNTER — Ambulatory Visit: Payer: 59

## 2017-08-08 ENCOUNTER — Telehealth: Payer: Self-pay | Admitting: Pulmonary Disease

## 2017-08-08 ENCOUNTER — Other Ambulatory Visit: Payer: Self-pay | Admitting: Pulmonary Disease

## 2017-08-08 NOTE — Telephone Encounter (Signed)
Called [facility] to check the status of Mrs. Wardrop order. The prompter said check status of an order,I repeated it and thought that's who answered the phone. But then she says she has to transfer me to office delivery. So I talk to [place] and he tells me he has to put her order in review. He said they would hopefully call back later today to set up del. or it could be Mon. Waiting.

## 2017-08-11 NOTE — Telephone Encounter (Signed)
Previous order is null & void. (Ordered too early, was not processed.)  # vials:1 Ordered date:08/11/17 Shipping Date:08/18/17

## 2017-08-11 NOTE — Telephone Encounter (Signed)
Called [facility] back, review was completed. Rep. Called pt. To get her permission to ship. It was given, Arvid Right is being del.08/19/17. I will put this with the order that is now void. Nothing further needed.

## 2017-08-19 NOTE — Telephone Encounter (Signed)
#   Vials:1 Arrival Date:08/19/17 Lot #:[id number] Exp Date:12/2020

## 2017-09-03 ENCOUNTER — Ambulatory Visit (INDEPENDENT_AMBULATORY_CARE_PROVIDER_SITE_OTHER): Payer: 59

## 2017-09-03 DIAGNOSIS — J4540 Moderate persistent asthma, uncomplicated: Secondary | ICD-10-CM

## 2017-09-05 DIAGNOSIS — J4540 Moderate persistent asthma, uncomplicated: Secondary | ICD-10-CM | POA: Diagnosis not present

## 2017-09-05 MED ORDER — OMALIZUMAB 150 MG ~~LOC~~ SOLR
150.0000 mg | Freq: Once | SUBCUTANEOUS | Status: AC
  Administered 2017-09-05: 150 mg via SUBCUTANEOUS

## 2017-09-23 ENCOUNTER — Telehealth: Payer: Self-pay | Admitting: Pulmonary Disease

## 2017-09-23 NOTE — Telephone Encounter (Signed)
#   Vials: 1 Arrival Date: 09/23/2017 Lot #: [id number] Exp Date: 01/2021

## 2017-10-03 ENCOUNTER — Ambulatory Visit (INDEPENDENT_AMBULATORY_CARE_PROVIDER_SITE_OTHER): Payer: 59

## 2017-10-03 DIAGNOSIS — J4540 Moderate persistent asthma, uncomplicated: Secondary | ICD-10-CM

## 2017-10-13 ENCOUNTER — Encounter: Payer: Self-pay | Admitting: Pulmonary Disease

## 2017-10-13 MED ORDER — OMALIZUMAB 150 MG ~~LOC~~ SOLR
150.0000 mg | Freq: Once | SUBCUTANEOUS | Status: AC
  Administered 2017-10-03: 150 mg via SUBCUTANEOUS

## 2017-10-21 ENCOUNTER — Encounter: Payer: Self-pay | Admitting: *Deleted

## 2017-11-03 ENCOUNTER — Ambulatory Visit: Payer: 59

## 2017-11-20 ENCOUNTER — Other Ambulatory Visit: Payer: Self-pay | Admitting: *Deleted

## 2017-11-20 ENCOUNTER — Telehealth: Payer: Self-pay | Admitting: Pulmonary Disease

## 2017-11-20 NOTE — Telephone Encounter (Signed)
I spoke to rep from [facility], she needed pt's p/a # and dates. I gave them to her she's going to call and find out what can be done as for  A $5 co-pay and see what they can work out. If she has to pay more than $5 "she's not going to be able to afford it." Rep. Was going to call back and let her know what she found out.  Pt. Will e-mail or call me back. Waiting.

## 2017-11-27 ENCOUNTER — Encounter: Payer: Self-pay | Admitting: Pulmonary Disease

## 2017-11-27 DIAGNOSIS — J4540 Moderate persistent asthma, uncomplicated: Secondary | ICD-10-CM | POA: Diagnosis not present

## 2017-11-27 NOTE — Telephone Encounter (Signed)
This is for Amanda [name]... just an FYI.... i finally have the approval for the Xolair from [facility] at $5.00 copay discount. they are in process of shipping it. it should arrive on Feb. 26th. i will be setting up an appointment for the shot. hopefully, this process is approved and finished.   thanks for your help and any others who helped as well.  Amanda [name]  Amanda [name] please advise. Thanks

## 2017-11-28 NOTE — Telephone Encounter (Signed)
Tammy - can you please give an update on this matter. Thanks.

## 2017-12-01 NOTE — Telephone Encounter (Signed)
#   vials:1 Ordered date:12/01/2017 Shipping Date:12/02/2017 Shipment will either be here 12/02/17 or 12/03/17.

## 2017-12-01 NOTE — Telephone Encounter (Addendum)
Called [facility]. They tried contacting pt. To set up payment and shipment. Co-pay too expensive. Pt. Is going to call the co-pay assistance card program to see if she is eligible for $5.00 co-pay. She is, her Arvid Right will be del. 12/02/2017.

## 2017-12-02 NOTE — Telephone Encounter (Signed)
Prefilled Syringes: # 150mg  1  #75mg   Arrival Date:12/02/2017 Lot #: 150mg  [id number]      75mg  Exp Date: 150mg  09/2018   75mg 

## 2017-12-03 ENCOUNTER — Ambulatory Visit (INDEPENDENT_AMBULATORY_CARE_PROVIDER_SITE_OTHER): Payer: BLUE CROSS/BLUE SHIELD

## 2017-12-03 ENCOUNTER — Telehealth: Payer: Self-pay | Admitting: Pulmonary Disease

## 2017-12-03 DIAGNOSIS — J4540 Moderate persistent asthma, uncomplicated: Secondary | ICD-10-CM | POA: Diagnosis not present

## 2017-12-03 MED ORDER — EPINEPHRINE 0.3 MG/0.3ML IJ SOAJ: 0.3000 mg | Freq: Once | INTRAMUSCULAR | 11 refills | Status: DC

## 2017-12-03 NOTE — Telephone Encounter (Signed)
Rx sent 

## 2017-12-04 MED ORDER — OMALIZUMAB 150 MG ~~LOC~~ SOLR
150.0000 mg | SUBCUTANEOUS | Status: DC
  Administered 2017-12-03: 150 mg via SUBCUTANEOUS

## 2017-12-04 NOTE — Progress Notes (Signed)
Documentation of medication administration and charges of Xolair have been completed by Lindsay Lemons, CMA based on the Xolair documentation sheet completed by Tammy Scott.  

## 2017-12-17 ENCOUNTER — Other Ambulatory Visit: Payer: Self-pay | Admitting: Pulmonary Disease

## 2017-12-25 ENCOUNTER — Telehealth: Payer: Self-pay | Admitting: Pulmonary Disease

## 2017-12-25 NOTE — Telephone Encounter (Signed)
Prefilled Syringe: #150mg  1  #75mg  0 Ordered Date: 12/25/17 Shipping Date: 12/29/17

## 2017-12-29 DIAGNOSIS — J4540 Moderate persistent asthma, uncomplicated: Secondary | ICD-10-CM | POA: Diagnosis not present

## 2017-12-30 NOTE — Telephone Encounter (Signed)
Prefilled Syringes: # 150mg  1  #75mg  0 Arrival Date: 12/30/2017 Lot #: 150mg  [id number]      75mg 0 Exp Date: 150mg  09/2018   75mg  0

## 2018-01-01 ENCOUNTER — Ambulatory Visit (INDEPENDENT_AMBULATORY_CARE_PROVIDER_SITE_OTHER): Payer: BLUE CROSS/BLUE SHIELD

## 2018-01-01 DIAGNOSIS — J4540 Moderate persistent asthma, uncomplicated: Secondary | ICD-10-CM | POA: Diagnosis not present

## 2018-01-02 MED ORDER — OMALIZUMAB 150 MG ~~LOC~~ SOLR
150.0000 mg | SUBCUTANEOUS | Status: DC
  Administered 2018-01-01: 150 mg via SUBCUTANEOUS

## 2018-01-02 NOTE — Progress Notes (Signed)
Xolair injection documentation and charges entered by Ashley Caulfield, RMA, based on injection sheet filled out by Tammy Scott during preparation and administration. This documentation process is due to office requirements.   

## 2018-01-26 ENCOUNTER — Telehealth: Payer: Self-pay | Admitting: Pulmonary Disease

## 2018-01-26 NOTE — Telephone Encounter (Signed)
Prefilled Syringe: #150mg  1  #75mg  0 Ordered Date: 01/26/18 Shipping Date: 01/28/2018

## 2018-01-27 ENCOUNTER — Ambulatory Visit: Payer: BLUE CROSS/BLUE SHIELD

## 2018-01-27 ENCOUNTER — Ambulatory Visit: Payer: BLUE CROSS/BLUE SHIELD | Admitting: Pulmonary Disease

## 2018-01-27 ENCOUNTER — Encounter: Payer: Self-pay | Admitting: Pulmonary Disease

## 2018-01-27 VITALS — BP 132/70 | HR 69 | Temp 98.4°F | Ht 65.0 in | Wt 184.0 lb

## 2018-01-27 DIAGNOSIS — E559 Vitamin D deficiency, unspecified: Secondary | ICD-10-CM

## 2018-01-27 DIAGNOSIS — E663 Overweight: Secondary | ICD-10-CM

## 2018-01-27 DIAGNOSIS — J302 Other seasonal allergic rhinitis: Secondary | ICD-10-CM | POA: Diagnosis not present

## 2018-01-27 DIAGNOSIS — J4530 Mild persistent asthma, uncomplicated: Secondary | ICD-10-CM | POA: Diagnosis not present

## 2018-01-27 DIAGNOSIS — K219 Gastro-esophageal reflux disease without esophagitis: Secondary | ICD-10-CM | POA: Diagnosis not present

## 2018-01-27 DIAGNOSIS — M8949 Other hypertrophic osteoarthropathy, multiple sites: Secondary | ICD-10-CM

## 2018-01-27 DIAGNOSIS — E782 Mixed hyperlipidemia: Secondary | ICD-10-CM

## 2018-01-27 DIAGNOSIS — M159 Polyosteoarthritis, unspecified: Secondary | ICD-10-CM

## 2018-01-27 DIAGNOSIS — E039 Hypothyroidism, unspecified: Secondary | ICD-10-CM

## 2018-01-27 DIAGNOSIS — I872 Venous insufficiency (chronic) (peripheral): Secondary | ICD-10-CM | POA: Diagnosis not present

## 2018-01-27 DIAGNOSIS — M150 Primary generalized (osteo)arthritis: Secondary | ICD-10-CM

## 2018-01-27 NOTE — Progress Notes (Signed)
Subjective:    Patient ID: Amanda [name], female    DOB: January 24, 1956, 62 y.o.   MRN: [id number]  HPI 62 y/o WF here for a follow up visit... she has multiple medical problems as noted below... she was prev followed by DrWelty-Wolfe at [place] for her Asthma, then switched to [facility], but both of them have left... I tried to get her to see Dr. Yates Decamp but pt has refused further referrals for her Asthma... the concern has been for the developement of fixed airway obstruction from her Asthma due to poor control and medication non-compliance in the past...  ~  SEE PREV EPIC NOTES FOR OLDER DATA >>    LABS 10/14:  FLP- at goals on Simva40;  Chems- ok x BS=118;  CBC- wnl;  TSH=0.43;  VitD=31 & rec to take 2000u daily...  LABS 10/15:  FLP- not at goal on Simva40 (?compliance);  Chems- ok w/ BS=108;  CBC- wnl x 8%eos;  TSH=6.70 & reminded to take her ArmourThyroid everyday;  VitD=22 & rec OTC VitD5000u daily...  ~  January 13, 2015:  24mo ROV & Amanda [name] reports breathing at baseline but seasonal allergies are kicking in; she also c/o some LBP, thinks it's her bed, offered XRays vs Ortho but she declines, advised heat/ back brace/ Tylenol etc... We reviewed the following medical problems during today's office visit >>     AR, Asthma> on Advair500Bid, ProairHFA prn, & Zyrtek10 (she stopped the Singulair on her own); she is an excellent Xolair cand but they couldn't get it worked out w/ her insurance (copay too $$); hasn't used any Pred in ?40yr...    Her medical management has been repeatedly hampered over the years by medication non-compliance...     Hyperlipid> on Simva40; last FLP 10/15 showed TChol 205, TG 72, HDL 42, LDL 149=> not as good as prev & I suspect compliance issues- asked to take it every eve, + better diet/ exerc/ wt reduction...    Hypothyroid> on ArmourThyroid60-2 tabs/d & she refuses Synthroid Rx; TSH 10/15 = 6.70 & she is clinically euthyroid, again reminded of the need for  100% compliance w/ dosing...    Overweight> wt down to 186# & we reviewed diet, exercise, wt reduction strategies...    HH, Reflux> on Protonix40Bid + Zantac75 as needed; she knows to use max antireflux regimen w/ elev HOB, NPO after dinner, etc...    GYN> prev seen by [facility] but she stopped going when they didn't help her hot flashes; she is again advised to estab w/ another GYN for Pelvic, Pap, Mammography, BMD, etc (she still has not complied w/ this request)...     DJD> hx left knee pain & arthroscopy 1999 by DrPaul; she uses OTC analgesics as needed...    VitD defic> intermittently on VitD50K weekly from [place]; stopped on her own- took OTC VitD supplement but she stopped that too; last VitD level 10/14 = 31 & asked to resume daily vit supplements (never did- asked again)...    Anxiety> offered Alpraz or similar Benzo in past but she declines med rx... 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 PLAN>>  We discussed adding Spiriva Respimat- 2sp daily due to her Spirometry report; continue REGULAR DOSING of all meds including Advair500Bid; she refuses to  take Mucinex, refuses nasal sprays, refuses to sched w/ Gyn...  ~  July 18, 2015:  16mo ROV & Amanda [name] presents w/ recent incr SOB, cough, wheezing & chest tightness which she thinks is related to "weather changes and allergies";  Notes baseline DOE w/ "a bunch of stuff" like housework, taking out the trash, etc; we have discussed need for diet, exercise, wt reduction on mult occais but she has been unable to comply...    Asthma/ AB> on Advair500Bid, VentolinHFA prn (uses thisQid recently); she hasn't had Pred in a long time & stopped Spiriva & Singulair on her own; I have rec that she add INCRUSE one inhalation daily &  Pred20mg - tapering schedule...    HL> supposed to be on Simva40; [place] 10/16 shows TChol 219, TG 86, HDL 51, LDL 151 and a call to her Pharm indicates no recent refills of this med- we discussed taking the stain med every day, regularly, preferrably Qhs...    Hypothyroid> supposed to be on her Armour Thyroid 60-2tabs daily; LABS 07/18/15 showed TSH=10.51 and she is reminded to take it every day so we can determine her optimal dose...    Overweight>  Weight today is 188# up 2# & we reviewed diet/ exercise/ wt reduction...    Vit D defic>  Supposed to be on OTC Vit D supplement daily but she declines to take it regularly; LABS 07/2015 shows VitD level = 17 and asked to restart VitD 50K weekly dosing...  EXAM shows Afeb, VSS, O2sat=96% on RA;  HEENT- neg, mallampati2;  Chest- mild exp wheezing, scat rhonchi, no rales or consolidation;  Heart- RR w/o m/r/g;  Abd- soft, nontender, neg;  Ext- w/o c/c/e;  Neuro- intact...  LABS 10/17/14>  FLP- not at goals but she hasn't filled the Simva40 in several months;  Chems- wnl x BS=112;  CBC- wnl;  TSH=10.51;  VitD=16.6.Marland KitchenMarland Kitchen  IMP/PLAN>>  Amanda [name] has an AB exac & we will treat w/ Pred tapering sched but she needs to take her regular meds daily as maintenance, we discussed adding INCRUSE to her Advair;  Her LABS also reflect poor compliance w/ medications and she PROMISES to do better...  ~  January 16, 2016:  75mo ROV & Amanda [name] reports increased stress as her sister passed away in 2022-11-25 w/ lung cancer [name]- never smoked, treated in [place]);  She also notes that her asthma has been acting up lately and that her cat is ill- now required to live inside w/ resultant incr asthma symptoms as well; c/o nasal drainage, dry hacking cough, chest tight & wheezing, notes SOB/DOE w/ regular walking, mild cough & min beige sput; she states she had a weird reaction to Doxy prev & refuses to take this antibiotic again...     Allergies & cat now resides indoors>  We reviewed need for  antihist, Flonase, saline, and a good electrostatic air cleaner in her bedroom...    Asthma/ AB>  On Advair500Bid, ProairHFA- using it Bid, not taking the INCRUSE (she says "didn't have success w/ Spiriva or Incruse"), has Tessalon & Hycodan for prn use;       HL>  Says she is doing the simva40 daily now but not fasting today...    Hypothy>  She says she is taking the Armour thyroid60-2 Qam regularly; TSH today 01/16/16 = 0.10 indicating over-replaced when taking 120mg  every day- REC to decr to 60mg  daily...    Anxiety>  Her sister passed away, her cat is ill & living indoors now affecting  her allergies & asthma acting up; offered Alpraz or similar rx but she declines... EXAM shows Afeb, VSS, O2sat=95% on RA;  HEENT- neg, mallampati2;  Chest- mild exp wheezing, scat rhonchi, no rales or consolidation;  Heart- RR w/o m/r/g;  Abd- soft, nontender, neg;  Ext- w/o c/c/e;  Neuro- intact...  CXR 01/16/16>  Norm heart size, clear lungs w/ mild left basilar atx, DJD Tspine, NAD...   Spirometry 01/16/16>  FVC=1.88 (58%), FEV1=1.10 (43%), %1sec=58%, mid-flows reduced at 18% predicted;  This is c/o a mod obstructive ventilatory defect  And GOLD Stage3 COPD...]  LABS 01/16/16>  TSH= 0.10 on the Armour thyroid 60mg - 2/d... This is oversuppressed & needs to back off to 60mg /d. IMP/PLAN>>  Amanda [name] will not take any additional inhalers so we will Rx w/ Medrol 8mg  slow wean + her Advair500Bid & ProairHFA rescue;  She will re-double her efforts at allergy Rx w/ Antihist Qam, Flonase Bid, Saline as needed & get the electrostatic air cleaner for her bedroom;  Finally add vigorous antireflux regimen w/ Protonix40 before dinner, NPO after dinner (she refuses stating "I drink stuff"), Elev HOB 6"... Rov in ~6weeks recheck.  ~  March 19, 2016:  27mo ROV & Amanda [name] notes that her breathing is back to baseline- she notes that she has new insurance now [facility] [employer]) & I feel it would be helpful to re-submit request for [facility] for her  Asthma, she is in agreement as a prev shot seemed to help but her insurance copay was prohibitive in past... Her CC is nocturnal leg pain x several weeks- down left leg & she denies injury, no LBP, & the pain is NOT present when she is on Medrol, OTC Advil w/ sl benefit as well; she notes some swelling as well; we decided to try Etodolac400Bid first- plus low sodium/ elevation/ support hose & consider further eval if not responding...     Allergies & cat now resides indoors>  We reviewed need for Antihist, Flonase, Saline, and a good electrostatic air cleaner in her bedroom...    Asthma/ AB>  On Advair500Bid, Singulair10, ProairHFA- using it Bid, not taking the INCRUSE (she says "didn't have success w/ Spiriva or Incruse"), has Tessalon & Hycodan for prn use;       HL>  Says she is doing the Simva40 daily now but not fasting today...    Hypothy>  She says she is taking the Armour Thyroid60-2 Qam regularly; TSH 01/16/16 = 0.10 indicating over-replaced when taking 120mg  every day- REC to decr to 60mg  daily...    Anxiety>  Her sister passed away 19-Nov-2022- lung cancer), her cat is ill & living indoors now affecting her allergies & asthma acting up; offered Alpraz or similar rx but she declines... EXAM shows Afeb, VSS, O2sat=95% on RA;  HEENT- neg, mallampati2;  Chest- mild exp wheezing, scat rhonchi, no rales or consolidation;  Heart- RR w/o m/r/g;  Abd- soft, nontender, neg;  Ext- w/o c/c/e;  Neuro- intact... IMP/PLAN>>  We decided to try the Etodolac400bid before getting additional w/u for the leg pain; rec to elim sodium/ elev legs/ use support hose of the swelling; we will request her new insurance [employer] to approve XOLAIR for her severe asthma...  ~  July 25, 2016:  45mo ROV & Amanda [name] is here for a pulmonary recheck & general follow up visit>  During her last visit we prescribed Lodine400 to try for her leg pain before considering further eval- pt tells me that the Pharm hand-out said this  drug could cause  cancer so she never filled the Rx & didn't call to discuss or pick an alternative; she fell in June & injured her right ankle- chose to see a podiatrist s/p 4 visits and eventually given Lasix20 prn swelling, states her pain is pretty much gone now... we reviewed the following medical problems during today's office visit >>     Her medical management has been repeatedly hampered over the years by medication non-compliance...     AR> on OTC antihist prn, Singulair10/d; prev RAST testing 10/13 showed IgE=68, Rast panel w/ CAT>>dust>mold>dog; she has cats and asked to exclude them from the bedroom & set up air cleaner etc but she has not done this.    AR, Asthma> on Advair500Bid (but only taking once per day), Singulair10, ProairHFA prn (hasn't needed); she started XOLAIR in Aug2017- monthly injections; she hasn't used any Pred in ?68yr; unfortunately her PFTs showed mod airflow obstruction which I believe is fixed due to her poorly controlled asthma x many yrs; she has refused adding an anticholinergic like Spiriva or Incruse to her regimen...    LPR> on Protonix40 (declined to incr to Bid), Zantac Qhs (not taking); she was asked to elev [facility] & NPO after dinner but she has not complied w/ these requests either...    Hyperlipid> on Simva40; FLP 10/17 shows TChol 159, TG 64, HDL 51, LDL 95=> looks great w/ better Simva40 compliance + better diet/ exerc/ wt reduction...    Hypothyroid> she was on ArmourThyroid60-2 tabs/d & she refuses Synthroid Rx; TSH 4/17 = 0.10 & we rec decr to one 60gr tab daily; f/u TSH 10/17 on 60gr/d shows TSH= 0.54, continue same.    Overweight> wt stable at 188# & BMI=31-2; we reviewed diet, exercise, wt reduction strategies...    HH, Reflux> on Protonix40 + Zantac75 as needed; she knows to use max antireflux regimen w/ elev HOB, NPO after dinner, etc...    GYN> prev seen by [facility] but she stopped going when they didn't help her hot flashes; she is again advised to estab w/ another GYN  for Pelvic, Pap, Mammography, BMD, etc (she still has not complied w/ this request)...     DJD, ven insuffic> hx left knee pain & arthroscopy 1999 by DrPaul; she uses OTC analgesics as needed; mild VI & rec to elim salt, elev legs, wear support hose; she has Lasix20 prn edema...    VitD defic> intermittently on VitD50K weekly from [place]; stopped on her own- took OTC VitD supplement but she stopped that too; VitD level 10/17 = 32 & asked to resume daily vit supplements...    Anxiety> offered Alpraz or similar Benzo in past but she declines med rx... EXAM shows Afeb, VSS, O2sat=97% on RA;  HEENT- neg, mallampati2;  Chest- decr BS bilat but clear w/o w/r/r;  Heart- RR w/o m/r/g;  Abd- soft, min epig tender, neg;  Ext- w/o c/c/e;  Neuro- intact...  LABS 07/25/18>  FLP- all parameters at goals on Simva40;  Chems- ok x FBS=117 & we discussed diet/ wt reduction;  CBC- wnl x 8% eos;  TSH=0.54;  VitD=32... IMP/PLAN>> Amanda [name]'s medical issues are improved w/ better med compliance & it is hoped that her asthma will improve as well using her inhalers + Xolair (if not reponding then she'd be a cand for Nucala w/ incr eos);  Continue current rx & the ArmourThyroid60 + add VitD3 ~2000u daily... Note FBS at 117 & she must get on a low  carb/ no seets diet & get wt down to avoid DM meds in her future...  ~  January 27, 2017:  40mo ROV & Amanda [name]'s last Xolair shot was 10/31/16 as we continued to try & get insurance company approval [employer]) for this medication;  She has severe life-long asthma, never smoked, and has developed a component of fixed airway obstruction due to her severe asthma over many yrs;  We were able to try the Xolair & she has reported a fabulous response to this medication w/o exacerbations/ asthma attacks/ etc while on the drug;  Since she has been off she notes her symptoms have worsened w/ cough, sneezing, drainage, watery eyes, small amt of beige sput, no hemoptysis, no CP, but increased SOB-  progressive to dyspneic w/ min activ;  She is reminded to stay on her prescribed regimen of> ADVAIR500Bid, SINGULAIR10/d, ZYRTEK10/d, PROAIR-HFA rescue inhaler prn (she's been using this 4x per day recently), she refuses LAMA rx (we tried Spiriva & Incruse but compliance was poor & she felt benefit was min); Note- last Prednisone usage was >21yr ago she says... We reviewed above problem list & meds...    EXAM shows Afeb, VSS, O2sat=95% on RA;  Wt=187#, BMI=31-2;  HEENT- neg, mallampati2;  Chest- decr BS bilat but clear w/o w/r/r;  Heart- RR w/o m/r/g;  Abd- soft, min epig tender, neg;  Ext- w/o c/c/e;  Neuro- intact...  CXR 01/27/17 (independently reviewed by me in the PACS system) showed norm heart size, clear lungs, NAD... IMP/PLAN>>  Amanda [name] has a long hx of severe difficult to control asthma, she has had a prob w/ medication compliance over the yrs, she has required freq courses of Prednisone in the past;  Since starting on xolair from the manufacturer she has noticed a signif improvement in her asthma control 7 has not had any exac, unfortunately we are unable to get her any more drug w/o her insurance company approval for the [place]- we are continuing our prior authorization process and letter writing campaign to procure this med on her behalf; in the meanwhile she will continue our chronic regimen as above PLUS control of cat dander, antireflux regimen, etc...     NOTE: >50% of this 92min ov was spent in counseling & coordination of care...  ~  July 29, 2017:  60mo ROV & Amanda [name] reports that her breathing is doing well on her [place] Right monthly- she denies cough, sputum, hemoptysis, CP, and notes SOB is stable- does yard & housework w/o change from her baseline; still not exercising! We reviewed the following medical problems during today's office visit>      AR> on OTC antihist prn, Singulair10/d; prev RAST testing 10/13 showed IgE=68, Rast panel w/ CAT>>dust>mold>dog; she has cats and asked to exclude  them from the bedroom & set up air cleaner etc but she has not done this.    AR, Asthma> on Advair500Bid (but only taking once per day), Singulair10, ProairHFA prn (hasn't needed); she started XOLAIR in Aug2017- monthly injections; she hasn't used any Pred in ?13yr; unfortunately her PFTs showed mod airflow obstruction which I believe is fixed due to her poorly controlled asthma x many yrs; she has refused adding an anticholinergic like Spiriva or Incruse to her regimen...    LPR> on Protonix40 (declined to incr to Bid), Zantac Qhs (not taking); she was asked to elev [facility] & NPO after dinner but she has not complied w/ these requests either...    Hyperlipid> on Simva40; [place] 10/18 shows TChol 145,  TG 68, HDL 52, LDL 80=> looks great w/ better Simva40 compliance + better diet/ exerc/ wt reduction...    Hypothyroid> she was on ArmourThyroid60-1 tab/d & she refuses Synthroid Rx; TSH 10/18 = 2.60 & rec to continue the same...    Overweight> wt stable at 188# & BMI=31-2; we reviewed diet, exercise, wt reduction strategies...    HH, Reflux> on Protonix40 + Zantac75 HS as needed; she knows to use max antireflux regimen w/ elev HOB, NPO after dinner, etc... She has refused GI referral for colonoscopy...    GYN> prev seen by [facility] but she stopped going when they didn't help her hot flashes; she is again advised to estab w/ another GYN for Pelvic, Pap, Mammography, BMD, etc (she still has not complied w/ this request)...     DJD, ven insuffic> hx left knee pain & arthroscopy 1999 by DrPaul; she uses OTC analgesics as needed; mild VI & rec to elim salt, elev legs, wear support hose; she has Lasix20 prn edema...    VitD defic> intermittently on VitD50K weekly from [place]; stopped on her own- took OTC VitD supplement but she stopped that too; VitD level 10/17 = 32 & asked to resume daily vit supplements...    Anxiety> offered Alpraz or similar Benzo in past but she declines med rx... EXAM shows Afeb,  VSS, O2sat=97% on RA;  Wt=188#, BMI=31-2;  HEENT- neg, mallampati2;  Chest- decr BS bilat but clear w/o w/r/r;  Heart- RR w/o m/r/g;  Abd- soft, min epig tender, neg;  Ext- w/o c/c/e;  Neuro- intact...  LABS 07/29/17>  FLP- all parameters at goals on Simva40;  Chems- wnl x BS=106;  CBC- wnl x eos=7.7%;  TSH=2.60;  VitD=25 (30-100)... IMP/PLAN>>  Takina is overall stable on her Advair, Singulair, Xolair- continue same;  Needs better diet, wt reduction, & exercise program- suggest gym/ [employer]/ etc;  Labs look good- needs low carb wt reducing diet, rec to incr VitD supplement;  She needs GYN follow up w/ mammogram, PAP, BMD;  She needs GI for colonoscopy...    ~  January 27, 2018:  62mo ROV & when last seen 07/2017 Special was stable on her Xolair Qmo, and we stressed diet/ exercise/ wt reduction, and asked her to incr her VitD supplement; In addition she needed GYN check for mammogram, PAP smear, BMD, and a GI eval for colonoscopy;  She really hasn't done any of this (except incr VitD to 2000u daily), and she tells me she signed up w/ PCP- Dr. Janett Billow [facility]) w/ an appt sched for July...  Due to an insurance change she went without her xolair for 2 months, but back on now & really helping- states she's not even having any problem w/ her cats at home!  She notes min cough (dry) w/o sput or congestion, notes rare wheeze but denies f/c/s; only notes SOB if she overdoes it "I'm out of shape"; she is caring for her 41 y/o mother who lives with her; denies CP/ palpit/ edema;  Amanda [name] continues to refuse Flu shots and Pneumonia shots...     ROS> she has had some foot pain and saw Podiatrist- [facility], [facility], on 08/21/17-- R foot pain x several months, note reviewed, Dx w/ peroneal tendonitis, XRays done, and rec to take NSAIDs as needed, wear stiff soled shoes, apply ice;  We discussed that appt & I offered pt referral to [facility] for eval if she desires & she will let us know... We  reviewed the following medical problems during today's office visit>      AR> on OTC antihist prn, Singulair10/d; prev RAST testing 10/13 showed IgE=68, Rast panel w/ CAT>>dust>mold>dog; she has cats and asked to exclude them from the bedroom & set up air cleaner etc but she has not done this.    AR, Asthma> on Advair500Bid (but only taking once per day), Singulair10, ProairHFA prn (hasn't needed); she started [place] in Aug2017- monthly injections & really helping (she hasn't used any Pred in >61yr); unfortunately her PFTs showed mod airflow obstruction which I believe is fixed due to her poorly controlled asthma x many yrs in the past; she has refused adding an anticholinergic like Spiriva or Incruse to her regimen!    LPR> on Protonix40 (declined to incr to Bid), Zantac Qhs (not taking); she was asked to elev [facility] & NPO after dinner but she has not complied w/ these requests either...    Hyperlipid> on Simva40; FLP 10/18 shows TChol 145, TG 68, HDL 52, LDL 80=> looks great w/ better Simva40 compliance + better diet/ exerc/ wt reduction...    Hypothyroid> she is on ArmourThyroid60-1 tab/d & she refuses Synthroid Rx; TSH 10/18 = 2.60 & rec to continue the same...    Overweight> wt stable at 184# & BMI=31; we reviewed diet, exercise, wt reduction strategies...    HH, Reflux> on Protonix40 + Zantac75 HS as needed; she knows to use max antireflux regimen w/ elev HOB, NPO after dinner, etc... She has refused GI referral for colonoscopy...    GYN> prev seen by [facility] but she stopped going when they didn't help her hot flashes; she is again advised to estab w/ another GYN for Pelvic, Pap, Mammography, BMD, etc (she still has not complied w/ this request)...     DJD, ven insuffic> hx left knee pain & arthroscopy 1999 by DrPaul; she uses OTC analgesics as needed; mild VI & rec to elim salt, elev legs, wear support hose; she has Lasix20 prn edema...    VitD defic> intermittently on VitD50K weekly from [place]; stopped on her own- took OTC VitD supplement but she stopped that too; VitD level 10/17 = 32 & asked to resume 1000u supplement daily; 10/18 VitD level=25 & asked to incr VitD2000u/d.    Anxiety> offered Alpraz or similar Benzo in past but she declines med rx... EXAM shows Afeb, VSS, O2sat=96% on RA;  Wt=184#, BMI=31-2;  HEENT- neg, mallampati2;  Chest- decr BS bilat but clear w/o w/r/r;  Heart- RR w/o m/r/g;  Abd- soft, min epig tender, neg;  Ext- w/o c/c/e;  Neuro- intact... IMP/PLAN>>  Constanza is stable & doing very well on the xolair- continue same meds, discussed compliance w/ medical regimen, etc;  We reviewed diet/ exercise/ wt reduction strategies, and plan recheck in 6 months, sooner if needed prn...         Problem List:  ALLERGIC RHINITIS (ICD-477.9)  ++allergy testing by DrESL in the 1980's w/ reactions to grasses, ragweed, dust, molds, cats, dog... prev on shots... ~  10/12:  She reports a new kitten at home, but denies allergy or asthma exac so far... ~  10/13:  I cannot find documentation of IgE/ RAST testing (?done at [facility] in the past, she was in a drug trial 2006 at [facility]); we sent labs today> IgE=  68 & RAST pos for Cat> Dust> Dog> molds...  We decided to apply for [facility] Rx => they could not secure payment from her insurance... ~  10/14:  on Zyrtek, Singulair10, Advair500, AlbutHFA prn; breathing has been good, allergies acting up & she has dermatitis on face- wants Dosepak- ok ~  4/15: presents c/o spring allergy symptoms on Singulair10 & Zyrtek OTC, refuses nasal sprays; Rec Depo80, Pred dosepak, etc... ~  4/16: this spring hasn't been so bad, yet; she stopped the Singulair on her own, won't use any nasal sprays; advised to call if round of Pred needed... ~  4/17: on OTC antihist prn, Singulair10/d; prev RAST testing 10/13 showed IgE=68, Rast panel w/ CAT>>dust>mold>dog; she has cats and asked to exclude them from the bedroom & set up air cleaner etc but she has not  done this.  ASTHMA (ICD-493.90) - on ADVAIR 500Bid, & VENTOLIN HFA as needed... she admits to using Advair once dailly & is encouraged to take meds regularly as perscribed; similarly she overuses the rescue inhaler & admonished to not use it more than Qid... ~  baseline CXR shows sl incr markings, NAD... ~  PFT's 5/05 showed FVC= 2.47 (76%), FEV1= 1.51 (56%), %1sec=61, mid-flows= 22%pred... ~  in 2006 she entered the EXTRA trial at [facility] which was a double blind trial testing XOLAIR... ~  last note from [facility] 6/07 DrCarraway w/ concern for her fixed airway obstruction... pt refused f/u in the [place]... ~  f/u PFT 10/11 showed FVC= 2.52 (71%), FEV1= 1.69 (60%), %1sec=67, mid-flows= 38%pred. ~  CXR 10/11 showed clear lungs, NAD... ~  10/13:  Treated for exac w/ Pred course; rechecked labs including IgE= 68 & RAST pos for Cat> Dust> Dog> molds... We decided to apply for [facility] Rx but they could not arrange payment from her insurance... She states that the 1st shot really helped... ~  10/14: on Zyrtek, Singulair10, Advair500, AlbutHFA prn; breathing has been good, allergies acting up & she has dermatitis on face- wants Dosepak- ok. ~  4/15: on Zyrtek10, Advair500Bid, Singulair10, ProairHFA prn (lately incr to qid); she is an excellent Xolair cand but they couldn't get it worked out w/ her insurance; hasn't used any Pred in >27mo til this visit w/ spring pollen. ~  10/15: on same meds but only doing the Advair once/d & not regular w/ the Singulair; as a result she continues to use the rescue inhaled 1-2/wk she says; we discussed taking meds regularly... 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 ~  05/2016> she has restarted the Xolair due to better coverage from her [facility] insurance...  HYPERLIPIDEMIA (ICD-272.4) - prev followed by DrDoerr, Endocrinology in [place]... prev on Zetia, now on SIMVASTATIN 40mg /d but admits to intermittent dosing. ~  4/11: pt presents w/ request to start writing Simva Rx & monitor labs since DrDoerr has left [place]... encouraged to take med regularly & follow low chol/ low fat diet... ~  FLP 10/11 on Simva40 showed TChol 198, TG 110, HDL 46, LDL 131 ~  FLP 10/12 on Simva40 showed TChol 172, TG  82, HDL 51, LDL 105... Improved, reminded of diet & compliance. ~  FLP 10/13 on Simva40 (not taking daily) showed TChol 229, TG 60, HDL 48, LDL 170==> rec to take med daily & refer to [facility]! ~  FLP 10/14 on Simva40 showed TChol 138, TG 78, HDL 42, LDL 81... Great job, continue daily dosing... ~  [place] 10/15 on Simva40 (?compliance) showed TChol 205, TG 72, HDL 42, LDL 149... Reminded to take it everyday, low fat diet & get the wt down. ~  FLP 10/16 on Simva40 ?compliance showed TChol 219, TG 86, HDL 51, LDL 151... Discussed taking it everyday! ~  [place] 10/17 on simva40 w/ better compliance sowed TChol 159, TG 64, HDL 51, LDL 95 & this is much improved...  HYPOTHYROIDISM (ICD-244.9) - taking ARMOUR THYROID 60mg - 2tabs daily... she was followed by DrDoerr, Endocrine in [place] for thyroid and her hyperlipidemia... she was prev on Levothyroid and had requested change to Armour Thyroid because she believed the Levothy was making her hair fall out... ~  4/11:  pt reqested that we write her Rx for the Armour Thyroid 60mg  since DrDoerr has left [facility]... pt is uncouraged to take the med daily. ~  labs 10/11 showed  TSH= 4.69 ~  Labs 10/12 on ArmourThyroid60-2/d showed TSH= 2.88... rec to continue same. ~  Labs 10/13 on ArmourThyroid60-2/d (not taking daily) showed TSH= 5.91==> rec to take med every day!!! ~  Labs 10/14 on ArmourThyroid60-2/d showed TSH=0.43 ~  Labs 10/15 on ArmourThyroid60-2/d showed TSH=6.70 and reminded to take all meds every day... ~  Labs 10/16 on Armour Thyroid60-2/d showed TSH=10.51 and we again discussed taking the med every day, 1st thing in AM etc... ~  Labs 4/17 on Armour Thyroid60-2/d & she assures me taking it regularly showed TSH=0.10! Therefore decr to one tab per day... ~  Labs 10/17 on Armour thyroid60/d showed TSH= 0.54 & rec to continue the same...  OVERWEIGHT (ICD-278.02) - we reviewed diet + exercise program required to lose weight... ~  weight 9/10 = 200#,  she is 5\' 5"  tall,  BMI= 34. ~  weight 4/11 = 192#,  this is as good as she's been in several yrs. 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 she continues to treat her reflux as needed w/ the Nexium and OTC Zantac even though we have felt this plays a signif roll in her Asthma... the consultants at [facility] agreed, but were no more effective at getting her to take her meds regularly... 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 ~  meds re-written & encouraged to take PROTONIX 40mg /d- 30 min before the 1st meal, and Zantac 75- 1-2 at bedtime... ~  4/14: she requests switch to NEXIUM40 from the Protonix... ~  She has been poorly compliant w/ her antireflux regimen...  GYN = DrRomaine in the past... pt states that she stopped going because she  couldn't help her w/ her hot flashes. ~  4/14: reminded of the importance of regular  Gyn checks- pelvic, Pap, Mammograms, etc...  OSTEOARTHRITIS (ICD-715.90) - she had a left knee arthroscopy by DrPaul in 1999...  VITAMIN D DEFICIENCY (ICD-268.9) - pt was on Vit D 50,000 u weekly from DrDoerr... we don't have prev labs from her but pt indicates that the Vit D level was "really really low" so we will refill the Rx & f/u Vit D level later. ~  labs 10/11 showed Vit D level = 44... OK to switch to 1-2000u daily OTC supplement. ~  4/12:  Pt requested to go back on Vit D 50000u weekly... ~  10/12:  Pt indicated that she stopped filling the Rx for Vit D 50K weekly, ?why? ~  BMD 5/14 was wnl w/ lowest Tscore = -0.2.Marland KitchenMarland Kitchen ~  10/14: she stopped the VitD 50K weekly Rx on her own; Labs 10/14 showed VitD level = 31 & she is requested to take 2000u OTC supplement daily  ~  Labs 10/15 showed VitD level = 22 and she is rec to take OTC VitD supplement ~5000u daily... ~  Pt reminded to take all meds regularly + OTC VitD supplements... ~  Labs 10/17 ?on VitD 1000u daily showed VitD level = 32... rec to incr to 2000u OTC VitD3 supplement daily...  ANXIETY (ICD-300.00)  Hx of SHINGLES (ICD-053.9) - she presented w/ cryptic left flank pain 12/09 that turned out to be Shingles... treated w/ Valtrex, Pred, Vicodin.  HEALTH MAINTENANCE:   ~  GI:  She has not established w/ a GI for routine screening colonoscopy- reminded again about this important screening procedure! ~  GYN:  GYN = DrRomaine in the past... pt states that she stopped going because she couldn't help her w/ her hot flashes; she is reminded of the need to establish w/ another GYN for PAP, Mammograms, BMD etc... ~  Immuniz:  she refuses Pneumovax & Flu vaccine... She received TDAP 2012 prior to a trip to [place].   Past Surgical History:  Procedure Laterality Date  . breast reductions  2000   in [place]  . left knee arthroscopy  1999   by Dr. Eddie Dibbles     Outpatient Encounter Medications as of 01/27/2018  Medication Sig  . ADVAIR DISKUS 500-50 MCG/DOSE AEPB INHALE ONE PUFF BY MOUTH EVERY TWELVE HOURS  . albuterol (PROVENTIL HFA;VENTOLIN HFA) 108 (90 Base) MCG/ACT inhaler Inhale 2 puffs into the lungs every 6 (six) hours as needed for wheezing.  Francia Greaves THYROID 60 MG tablet TAKE 1 TABLET BY MOUTH ONCE DAILY  . aspirin 81 MG tablet Take 81 mg by mouth daily.   . cetirizine (ZYRTEC) 10 MG tablet Take 10 mg by mouth daily as needed.  . cholecalciferol (VITAMIN D) 1000 units tablet Take 2,000 Units by mouth daily.   . montelukast (SINGULAIR) 10 MG tablet TAKE 1 TABLET (10 MG TOTAL) BY MOUTH AT BEDTIME.  . pantoprazole (PROTONIX) 40 MG tablet TAKE 1 TABLET (40 MG TOTAL) BY MOUTH 2 (TWO) TIMES DAILY.  Marland Kitchen Pseudoephedrine-Ibuprofen (ADVIL COLD & SINUS LIQUI-GELS) 30-200 MG CAPS As needed  . ranitidine (ZANTAC) 75 MG tablet Take 1 tablet (75 mg total) by mouth at bedtime as needed. (Patient taking differently: Take 75 mg by mouth at bedtime as needed. As needed)  . simvastatin (ZOCOR) 40 MG tablet TAKE 1 TABLET (40 MG TOTAL) BY MOUTH AT BEDTIME.  Marland Kitchen XOLAIR 150 MG/ML SOSY INJECT 150 MG SUBCUTANEOUSLY EVERY 4 WEEKS.  . [DISCONTINUED] furosemide (LASIX) 20 MG tablet Take 1 tablet (20 mg total) by mouth  daily as needed for fluid.  . [DISCONTINUED] montelukast (SINGULAIR) 10 MG tablet TAKE 1 TABLET (10 MG TOTAL) BY MOUTH AT BEDTIME.  . [DISCONTINUED] simvastatin (ZOCOR) 40 MG tablet TAKE 1 TABLET (40 MG TOTAL) BY MOUTH AT BEDTIME.  . predniSONE (DELTASONE) 20 MG tablet Take as directed (Patient not taking: Reported on 01/27/2018)   Facility-Administered Encounter Medications as of 01/27/2018  Medication  . omalizumab Arvid Right) injection 150 mg    Allergies  Allergen Reactions  . Azithromycin     zpak does not work for the pt    Immunization History  Administered Date(s) Administered  . Tdap 01/21/2011  Amanda [name] has declined FLU shots and PNEUMONIA  vaccinations...   Current Medications, Allergies, Past Medical History, Past Surgical History, Family History, and Social History were reviewed in [employer] record.    Review of Systems       See HPI - all other systems neg except as noted...      The patient complains of dyspnea on exertion.  The patient denies anorexia, fever, weight loss, weight gain, vision loss, decreased hearing, hoarseness, chest pain, syncope, peripheral edema, prolonged cough, headaches, hemoptysis, abdominal pain, melena, hematochezia, severe indigestion/heartburn, hematuria, incontinence, muscle weakness, suspicious skin lesions, transient blindness, difficulty walking, depression, unusual weight change, abnormal bleeding, enlarged lymph nodes, and angioedema.     Objective:   Physical Exam      WD, overweight, 62 y/o WF in NAD... GENERAL:  Alert & oriented; & cooperative... HEENT:  [place]/AT, EOM-wnl, PERRLA, Fundi-benign, EACs-clear, TMs-wnl, NOSE-clear, THROAT-clear & wnl. NECK:  Supple w/ full ROM; no JVD; normal carotid impulses w/o bruits; no thyromegaly or nodules palpated; no lymphadenopathy. CHEST:  Clear to P & A; decr BS bilat; without wheezes/ rales/ or rhonchi heard... HEART:  Regular Rhythm; without murmurs/ rubs/ or gallops detected... ABDOMEN:  Soft & nontender; normal bowel sounds; no organomegaly or masses palpated... EXT: without deformities, mild arthritic changes; no varicose veins/ venous insuffic/ or edema. NEURO:  CN's intact; no focal neuro deficits... DERM:  No lesions noted; no rash etc...  RADIOLOGY DATA:  Reviewed in the EPIC EMR & discussed w/ the patient...  LABORATORY DATA:  Reviewed in the EPIC EMR & discussed w/ the patient...   Assessment & Plan:    ASTHMA>  Asked to use the Advair Bid regularly & gauge control by NOT having to use the rescue inhaler... IgE= 68 w/ pos RAST to Cats, Dust, Dog, some molds & we tried for Xolair treatment but  unfortunately this could not be arranged due to her insurance...  4/14> try Singulair=> she wants to continue this rx... 4/15> springtime exac due to pollen & treated w/ Depo80, dosepak, Hycodan... 10/15> reminded to take all regular meds REGULARLY! 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 10/16> she presents w/ an exac, only taking Advair Bid & VentolinHFA prn, she stopped the Spiriva & ?if using the Advair every day; we reviewed the need for daily meds- try Advair500Bid & Incruse daily, plus Rx Pred taper... 4/17> still not controlled & Rx hampered by medication compliance; we Rx w/ Medrol taper + her Advair500 & ProairHFA, needs allergen management & antireflux regimen... 6/17> she feels she is back to baseline; has new [facility] insurance & we will try for Arvid Right again => restarted 05/2016 & improved... 10/17> Xitlali's medical issues are improved w/ better med compliance &  it is hoped that her asthma will improve as well using her inhalers + Xolair (if not reponding then she'd be a cand for Nucala w/ incr eos);  Continue current rx & the ArmourThyroid60 + add VitD3 ~2000u daily... Note FBS at 117 & she must get on a low carb/ no seets diet & get wt down to avoid DM meds in her future 01/27/17>  Kora has a long hx of severe difficult to control asthma, she has had a prob w/ medication compliance over the yrs, she has required freq courses of Prednisone in the past;  Since starting on xolair from the manufacturer she has noticed a signif improvement in her asthma control 7 has not had any exac, unfortunately we are unable to get her any more drug w/o her insurance company approval for the Jeff- we are continuing our prior authorization process and letter writing campaign to procure this med on her behalf; in the meanwhile she will continue our chronic regimen as above PLUS  control of cat dander, antireflux regimen, etc.. 07/29/17>  Loriene is overall stable on her Advair, Singulair, Xolair- continue same;  Needs better diet, wt reduction, & exercise program- suggest gym/ [employer]/ etc;  Labs look good- needs low carb wt reducing diet, rec to incr VitD supplement;  She needs GYN follow up w/ mammogram, PAP, BMD;  She needs GI for colonoscopy 01/27/18>   Kambrie is stable & doing very well on the xolair- continue same meds, discussed compliance w/ medical regimen, etc;  We reviewed diet/ exercise/ wt reduction strategies, and plan recheck in 6 months, sooner if needed prn.Marland Kitchen   HYPERLIPID>  On Simva40 & asked to take it nightly to gauge it's effectiveness; FLP improved w/ more regular dosing; Needs better diet & wt loss...  HYPOTHYROID>  On Armor Thyroid which she insists upon (prev from DrDoerr); TSH looks good when she takes it everyday; Clinically euthyroid, Continue same...  HH/ REFLUX>  Asked to be diligent w/ antireflux measures including the Protonix40Bid & ZantacQhs if nec...  Vit D Deif>  she stopped the 50K treatment on her own; VitD level 10/15= 22 & asked to take 5000u daily OTC supplement...  OVERWEIGHT>  We reviewed diet + exercise needed to lose wt...  Other medical issues as noted...                                     She requested a Pred Rx "to keep on hand" Patient's Medications  New Prescriptions   No medications on file  Previous Medications   ADVAIR DISKUS 500-50 MCG/DOSE AEPB    INHALE ONE PUFF BY MOUTH EVERY TWELVE HOURS   ALBUTEROL (PROVENTIL HFA;VENTOLIN HFA) 108 (90 BASE) MCG/ACT INHALER    Inhale 2 puffs into the lungs every 6 (six) hours as needed for wheezing.   ARMOUR THYROID 60 MG TABLET    TAKE 1 TABLET BY MOUTH ONCE DAILY   ASPIRIN 81 MG TABLET    Take 81 mg by mouth daily.    CETIRIZINE (ZYRTEC) 10 MG TABLET    Take 10 mg by mouth daily as needed.   CHOLECALCIFEROL (VITAMIN D) 1000 UNITS TABLET    Take 2,000 Units by mouth daily.     MONTELUKAST (SINGULAIR) 10 MG TABLET    TAKE 1 TABLET (10 MG TOTAL) BY MOUTH AT BEDTIME.   PANTOPRAZOLE (PROTONIX) 40 MG TABLET    TAKE 1 TABLET (40  MG TOTAL) BY MOUTH 2 (TWO) TIMES DAILY.   PREDNISONE (DELTASONE) 20 MG TABLET    Take as directed   PSEUDOEPHEDRINE-IBUPROFEN (ADVIL COLD & SINUS LIQUI-GELS) 30-200 MG CAPS    As needed   RANITIDINE (ZANTAC) 75 MG TABLET    Take 1 tablet (75 mg total) by mouth at bedtime as needed.   SIMVASTATIN (ZOCOR) 40 MG TABLET    TAKE 1 TABLET (40 MG TOTAL) BY MOUTH AT BEDTIME.   XOLAIR 150 MG/ML SOSY    INJECT 150 MG SUBCUTANEOUSLY EVERY 4 WEEKS.  Modified Medications   No medications on file  Discontinued Medications   FUROSEMIDE (LASIX) 20 MG TABLET    Take 1 tablet (20 mg total) by mouth daily as needed for fluid.   MONTELUKAST (SINGULAIR) 10 MG TABLET    TAKE 1 TABLET (10 MG TOTAL) BY MOUTH AT BEDTIME.   SIMVASTATIN (ZOCOR) 40 MG TABLET    TAKE 1 TABLET (40 MG TOTAL) BY MOUTH AT BEDTIME.

## 2018-01-27 NOTE — Patient Instructions (Signed)
Today we updated your med list in our EPIC system...    Continue your current medications the same...  Continue the Xolair shots monthly...  Let me know if you want to see an Orthopedic foot specialist regarding the tendonitis or the cyst o the top of your left foot...  Call for any questions or if we can be of service in any way...  Let's plan a follow up visit in 35mo, sooner if needed for problems.Marland KitchenMarland Kitchen

## 2018-01-28 DIAGNOSIS — J4540 Moderate persistent asthma, uncomplicated: Secondary | ICD-10-CM | POA: Diagnosis not present

## 2018-01-28 NOTE — Telephone Encounter (Signed)
Xolair has been set up for del. 01/29/18. I will leave encounter open to record when xolair comes in.

## 2018-01-28 NOTE — Telephone Encounter (Signed)
Bernika from [facility] calling to set up delivery for Xolair. Cb is [phone]

## 2018-01-29 NOTE — Telephone Encounter (Signed)
Prefilled Syringes: # 150mg  1  #75mg  0 Arrival Date: 01/29/2018 Lot #: 150mg  [id number]      75mg  0 Exp Date: 150mg  09/2018   75mg  0

## 2018-02-02 ENCOUNTER — Ambulatory Visit (INDEPENDENT_AMBULATORY_CARE_PROVIDER_SITE_OTHER): Payer: BLUE CROSS/BLUE SHIELD

## 2018-02-02 DIAGNOSIS — J4541 Moderate persistent asthma with (acute) exacerbation: Secondary | ICD-10-CM

## 2018-02-03 ENCOUNTER — Telehealth: Payer: Self-pay | Admitting: Pulmonary Disease

## 2018-02-03 MED ORDER — OMALIZUMAB 150 MG ~~LOC~~ SOLR
150.0000 mg | SUBCUTANEOUS | Status: DC
  Administered 2018-02-02: 150 mg via SUBCUTANEOUS

## 2018-02-03 MED ORDER — EPINEPHRINE 0.3 MG/0.3ML IJ SOAJ: 0.3000 mg | Freq: Once | INTRAMUSCULAR | 11 refills | Status: DC

## 2018-02-03 NOTE — Telephone Encounter (Signed)
Rx sent 

## 2018-02-03 NOTE — Progress Notes (Signed)
Documentation of medication administration and charges of Xolair have been completed by Brighton Pilley, CMA based on the Xolair documentation sheet completed by Tammy Scott.  

## 2018-02-24 DIAGNOSIS — J4540 Moderate persistent asthma, uncomplicated: Secondary | ICD-10-CM | POA: Diagnosis not present

## 2018-02-25 ENCOUNTER — Telehealth: Payer: Self-pay | Admitting: Pulmonary Disease

## 2018-02-25 NOTE — Telephone Encounter (Signed)
Prefilled Syringes: # 150mg  1  #75mg  0 Arrival Date: 02/25/18 Lot #: 150mg  [id number]      75mg  0 Exp Date: 150mg  09/2018   75mg  0   I was off Monday, Not sure if Caryl Pina ordered it then or if I ordered it Fri. Before I left.

## 2018-03-05 ENCOUNTER — Ambulatory Visit (INDEPENDENT_AMBULATORY_CARE_PROVIDER_SITE_OTHER): Payer: BLUE CROSS/BLUE SHIELD

## 2018-03-05 DIAGNOSIS — J4530 Mild persistent asthma, uncomplicated: Secondary | ICD-10-CM

## 2018-03-06 MED ORDER — OMALIZUMAB 150 MG ~~LOC~~ SOLR
150.0000 mg | Freq: Once | SUBCUTANEOUS | Status: AC
  Administered 2018-03-05: 150 mg via SUBCUTANEOUS

## 2018-04-02 ENCOUNTER — Telehealth: Payer: Self-pay | Admitting: Pulmonary Disease

## 2018-04-02 DIAGNOSIS — J4540 Moderate persistent asthma, uncomplicated: Secondary | ICD-10-CM | POA: Diagnosis not present

## 2018-04-02 NOTE — Telephone Encounter (Signed)
Prefilled Syringe: #150mg  1  #75mg  0 Ordered Date: 04/02/18 Shipping Date: 04/02/18

## 2018-04-03 NOTE — Telephone Encounter (Signed)
Prefilled Syringes: # 150mg  1  #75mg  0 Arrival Date: 04/03/18 Lot #: 150mg  [id number]      75mg 0 Exp Date: 150mg  10/2018   75mg  0

## 2018-04-06 ENCOUNTER — Ambulatory Visit (INDEPENDENT_AMBULATORY_CARE_PROVIDER_SITE_OTHER): Payer: BLUE CROSS/BLUE SHIELD

## 2018-04-06 DIAGNOSIS — J4530 Mild persistent asthma, uncomplicated: Secondary | ICD-10-CM

## 2018-04-07 MED ORDER — OMALIZUMAB 150 MG ~~LOC~~ SOLR
150.0000 mg | Freq: Once | SUBCUTANEOUS | Status: AC
  Administered 2018-04-06: 150 mg via SUBCUTANEOUS

## 2018-04-30 NOTE — Progress Notes (Signed)
[facility] [address], [place], [place] [address] [phone]  Date:  05/04/2018   Name:  Amanda [name]   DOB:  December 22, 1955   MRN:  [id number]  PCP:  Noralee Space, MD    Chief Complaint: New Patient (Initial Visit) (no concerns, est care)   History of Present Illness:  Amanda [name] is a 62 y.o. very pleasant female patient who presents with the following:  Here today to establish care as a new patient- her mom Collie Siad is my patient  She is a pt of Dr. Lenna Gilford for pulmonology- he cares for her persistent asthma It looks like he wanted her to get a general PCP as he is retiring soon  History of asthma, vit D def, hyperlipidemia, hypothyroidism  Mammo:  Last done in 2012 Pap: years ago, she is overdue. Never had an abnl. She is not SA now Colon: done prior to age 38 for benign GI disease.  It was negative per her report.  She thinks that maybe her GF had colon cancer but she is not really sure, no other family history that she is aware of  Hep C screening is due Labs done in October, slightly low vit D at that time   She reports that she did go to [facility] years for her asthma  She takes oral steroids prn but is not taking them now  She is on inhaled advair which has been key to her asthma control Also singulair, prn albuterol maybe once a day on average Allergies do bother her She is on xolair shots monthly- as it wears off she can notice the difference She is also on zyrtec She has multiple asthma triggers- exercise, fumes, allergens, illness, air temp change   Her sister had lung cancer as a non- smoker Her mother had uterine cancer  Heart issues tend to run in her family She will see Lenna Gilford in October, she will be transferred to a new pulmonologist as he is retiring  Pt would prefer to do labs in October with him and then start routine labs with me next time  She declines to do a pap  today  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 Chronic venous insufficiency 07/25/2016  . Pain of left leg 03/19/2016  . LPRD (laryngopharyngeal reflux disease) 01/16/2016  . Vitamin D deficiency 06/22/2009  . Backache 09/05/2008  . Overweight 04/13/2008  . Anxiety state 04/13/2008  . Allergic rhinitis 04/13/2008  . Diaphragmatic hernia 04/13/2008  . Osteoarthritis 04/13/2008  . Hypothyroidism 04/12/2008  . Hyperlipidemia 04/12/2008  . Asthma 04/12/2008    Past Medical History:  Diagnosis Date  . Allergic rhinitis   . Asthma   . Gastroesophageal reflux disease with hiatal hernia   . History of shingles   . Hyperlipidemia   . Hypothyroidism   . Osteoarthritis   . Overweight(278.02)   . Vitamin D deficiency     Past Surgical History:  Procedure Laterality Date  . breast reductions  2000   in [place]  . left knee arthroscopy  1999   by Dr. Eddie Dibbles    Social History   Tobacco Use  . Smoking status: Never Smoker  . Smokeless tobacco: Never Used  Substance Use Topics  . Alcohol use: Yes    Comment: social use  . Drug use: No    History reviewed. No pertinent family history.  Allergies  Allergen Reactions  . Azithromycin     zpak does not work for the pt    Medication list has been reviewed and updated.  Current Outpatient Medications on File Prior to Visit  Medication Sig Dispense Refill  . ADVAIR DISKUS 500-50 MCG/DOSE AEPB INHALE ONE PUFF BY MOUTH EVERY TWELVE HOURS 60 each 9  . albuterol (PROVENTIL HFA;VENTOLIN HFA) 108 (90 Base) MCG/ACT inhaler Inhale 2 puffs into the  lungs every 6 (six) hours as needed for wheezing. 1 Inhaler 11  . ARMOUR THYROID 60 MG tablet TAKE 1 TABLET BY MOUTH ONCE DAILY 30 tablet 9  . aspirin 81 MG tablet Take 81 mg by mouth daily.     . cetirizine (ZYRTEC) 10 MG tablet Take 10 mg by mouth daily as needed.    . cholecalciferol (VITAMIN D) 1000 units tablet Take 2,000 Units by mouth daily.     . montelukast (SINGULAIR) 10 MG tablet TAKE 1 TABLET (10 MG TOTAL) BY MOUTH AT BEDTIME. 30 tablet 9  . Omalizumab (XOLAIR [place]) Inject into the skin.    . pantoprazole (PROTONIX) 40 MG tablet TAKE 1 TABLET (40 MG TOTAL) BY MOUTH 2 (TWO) TIMES DAILY. 60 tablet 9  . Pseudoephedrine-Ibuprofen (ADVIL COLD & SINUS LIQUI-GELS) 30-200 MG CAPS As needed    . ranitidine (ZANTAC) 75 MG tablet Take 1 tablet (75 mg total) by mouth at bedtime as needed. (Patient taking differently: Take 75 mg by mouth at bedtime as needed. As needed) 30 tablet 11  . simvastatin (ZOCOR) 40 MG tablet TAKE 1 TABLET (40 MG TOTAL) BY MOUTH AT BEDTIME. 30 tablet 9   No current facility-administered medications on file prior to visit.     Review of Systems: No fever or chills No CP  No unusual SOB As per HPI- otherwise negative.   Physical Examination: Vitals:   05/04/18 0859  BP: (!) 154/86  Pulse: 69  Resp: 16  SpO2: 96%   Vitals:   05/04/18 0859  Weight: 185 lb 3.2 oz (84 kg)  Height: 5\' 5"  (1.651 m)   Body mass index is 30.82 kg/m. Ideal Body Weight: Weight in (lb) to have BMI = 25: 149.9  GEN: WDWN, NAD, Non-toxic, A & O x 3, obese, looks well  HEENT: Atraumatic, Normocephalic. Neck supple. No masses, No LAD.Bilateral TM wnl, oropharynx normal.  PEERL,EOMI.   Ears and Nose: No external deformity. CV: RRR, No M/G/R. No JVD. No thrill. No extra heart sounds. PULM: CTA B except for minimal bilateral wheezes, crackles, rhonchi. No retractions. No resp. distress. No accessory muscle use. ABD: S, NT, ND, +BS. No rebound. No HSM. EXTR: No c/c/e NEURO Normal gait.   PSYCH: Normally interactive. Conversant. Not depressed or anxious appearing.  Calm demeanor.    Assessment and Plan: Mild persistent asthma without complication  Screening for breast cancer - Plan: MM 3D SCREEN BREAST BILATERAL, CANCELED: MM DIAG BREAST TOMO BILATERAL  Screening for colon cancer  Elevated blood pressure reading  Encounter for medical examination to establish care  Screening declined by  patient  Here as a new patient to establish care She is overdue for several services Ordered a mammogram and she plans to have this done asap Ordered cologuard Encouraged pap and offered to do today but she declines Will do hep C screening and next blood draw with me BP is a bit high today which is not typical for her; she will have checked with her allergy shot later this week Plan to visit next year for a routine check up   Signed Lamar Blinks, MD

## 2018-05-04 ENCOUNTER — Ambulatory Visit: Payer: BLUE CROSS/BLUE SHIELD | Admitting: Family Medicine

## 2018-05-04 ENCOUNTER — Telehealth: Payer: Self-pay | Admitting: Pulmonary Disease

## 2018-05-04 ENCOUNTER — Encounter: Payer: Self-pay | Admitting: Family Medicine

## 2018-05-04 VITALS — BP 145/90 | HR 69 | Resp 16 | Ht 65.0 in | Wt 185.2 lb

## 2018-05-04 DIAGNOSIS — Z0000 Encounter for general adult medical examination without abnormal findings: Secondary | ICD-10-CM

## 2018-05-04 DIAGNOSIS — J4530 Mild persistent asthma, uncomplicated: Secondary | ICD-10-CM

## 2018-05-04 DIAGNOSIS — Z1231 Encounter for screening mammogram for malignant neoplasm of breast: Secondary | ICD-10-CM

## 2018-05-04 DIAGNOSIS — Z1211 Encounter for screening for malignant neoplasm of colon: Secondary | ICD-10-CM

## 2018-05-04 DIAGNOSIS — Z1239 Encounter for other screening for malignant neoplasm of breast: Secondary | ICD-10-CM

## 2018-05-04 DIAGNOSIS — R030 Elevated blood-pressure reading, without diagnosis of hypertension: Secondary | ICD-10-CM | POA: Diagnosis not present

## 2018-05-04 DIAGNOSIS — Z5320 Procedure and treatment not carried out because of patient's decision for unspecified reasons: Secondary | ICD-10-CM

## 2018-05-04 NOTE — Patient Instructions (Addendum)
It was nice to see you today- please say hi to your mom for me We will get you set up for cologuard to screen for colon cancer I would also recommend that you have a mammogram asap, and I am glad to do a pap for you at your convenience Your BP was a bit high today- this is not typical for you Please ask if they can check your BP when you get your shot this Friday- if still higher than 140/90 please alert me  Take care, let's plan to visit about 6 months after you next see Dr. Lenna Gilford

## 2018-05-04 NOTE — Telephone Encounter (Signed)
Pt called [facility] 04/30/18 to order med.Arvid Right will del on 05/06/18, her Arvid Right is thru her [facility], they have to verify her ins. Ea. Time. Once that is done [facility] Will ship [place].  Prefilled Syringe: #150mg  1  #75mg  0 Ordered Date: Pt ordered 04/30/18 and asked for her Xolair to be del 05/06/18. 05/04/18(When I called, the rep told me all of this.) Shipping Date: 05/05/18

## 2018-05-05 DIAGNOSIS — J4540 Moderate persistent asthma, uncomplicated: Secondary | ICD-10-CM | POA: Diagnosis not present

## 2018-05-06 NOTE — Telephone Encounter (Signed)
Prefilled Syringes: # 150mg  1  #75mg  0 Arrival Date: 05/06/18 Lot #: 150mg  [id number]      75mg  0 Exp Date: 150mg  10/2018   75mg  0

## 2018-05-08 ENCOUNTER — Ambulatory Visit (INDEPENDENT_AMBULATORY_CARE_PROVIDER_SITE_OTHER): Payer: BLUE CROSS/BLUE SHIELD

## 2018-05-08 DIAGNOSIS — J4540 Moderate persistent asthma, uncomplicated: Secondary | ICD-10-CM

## 2018-05-12 ENCOUNTER — Ambulatory Visit (HOSPITAL_BASED_OUTPATIENT_CLINIC_OR_DEPARTMENT_OTHER)
Admission: RE | Admit: 2018-05-12 | Discharge: 2018-05-12 | Disposition: A | Discharge status: Home / Self Care | Payer: BLUE CROSS/BLUE SHIELD | Source: Ambulatory Visit | Attending: Family Medicine | Admitting: Family Medicine

## 2018-05-12 ENCOUNTER — Encounter (HOSPITAL_BASED_OUTPATIENT_CLINIC_OR_DEPARTMENT_OTHER): Payer: Self-pay

## 2018-05-12 DIAGNOSIS — Z1239 Encounter for other screening for malignant neoplasm of breast: Secondary | ICD-10-CM

## 2018-05-12 DIAGNOSIS — Z1231 Encounter for screening mammogram for malignant neoplasm of breast: Secondary | ICD-10-CM | POA: Diagnosis not present

## 2018-05-15 MED ORDER — OMALIZUMAB 150 MG ~~LOC~~ SOLR
150.0000 mg | SUBCUTANEOUS | Status: DC
  Administered 2018-05-08: 150 mg via SUBCUTANEOUS

## 2018-05-15 NOTE — Progress Notes (Signed)
Documentation of medication administration and charges of Xolair have been completed by Lindsay Lemons, CMA based on the Xolair documentation sheet completed by Tammy Scott.  

## 2018-05-18 ENCOUNTER — Encounter: Payer: Self-pay | Admitting: Family Medicine

## 2018-05-18 DIAGNOSIS — I10 Essential (primary) hypertension: Secondary | ICD-10-CM

## 2018-05-19 MED ORDER — LISINOPRIL 5 MG PO TABS: 5.0000 mg | ORAL_TABLET | Freq: Every day | ORAL | 9 refills | Status: DC

## 2018-05-19 NOTE — Addendum Note (Signed)
Addended by: Lamar Blinks C on: 05/19/2018 06:53 AM   Modules accepted: Orders

## 2018-05-25 ENCOUNTER — Encounter: Payer: Self-pay | Admitting: Pulmonary Disease

## 2018-05-25 MED ORDER — ALBUTEROL SULFATE HFA 108 (90 BASE) MCG/ACT IN AERS: 2.0000 | INHALATION_SPRAY | Freq: Four times a day (QID) | RESPIRATORY_TRACT | 5 refills | Status: DC | PRN

## 2018-06-02 ENCOUNTER — Encounter: Payer: Self-pay | Admitting: Family Medicine

## 2018-06-02 DIAGNOSIS — Z1211 Encounter for screening for malignant neoplasm of colon: Secondary | ICD-10-CM | POA: Diagnosis not present

## 2018-06-02 DIAGNOSIS — Z1212 Encounter for screening for malignant neoplasm of rectum: Secondary | ICD-10-CM | POA: Diagnosis not present

## 2018-06-02 LAB — COLOGUARD

## 2018-06-03 ENCOUNTER — Telehealth: Payer: Self-pay | Admitting: Pulmonary Disease

## 2018-06-03 NOTE — Telephone Encounter (Signed)
Pt must have called [facility] and ordered xolair. (I didn't) Pending verification of ins. Del is set up for 06/05/18.  Prefilled Syringe: #150mg  1  #75mg  0 Ordered Date: not sure Shipping Date: 06/04/18

## 2018-06-04 DIAGNOSIS — J4540 Moderate persistent asthma, uncomplicated: Secondary | ICD-10-CM | POA: Diagnosis not present

## 2018-06-10 ENCOUNTER — Encounter: Payer: Self-pay | Admitting: Family Medicine

## 2018-06-10 ENCOUNTER — Ambulatory Visit (INDEPENDENT_AMBULATORY_CARE_PROVIDER_SITE_OTHER): Payer: BLUE CROSS/BLUE SHIELD

## 2018-06-10 ENCOUNTER — Telehealth: Payer: Self-pay | Admitting: *Deleted

## 2018-06-10 DIAGNOSIS — J4540 Moderate persistent asthma, uncomplicated: Secondary | ICD-10-CM

## 2018-06-10 MED ORDER — OMALIZUMAB 150 MG ~~LOC~~ SOLR
150.0000 mg | Freq: Once | SUBCUTANEOUS | Status: AC
  Administered 2018-06-10: 150 mg via SUBCUTANEOUS

## 2018-06-10 NOTE — Telephone Encounter (Signed)
Received Cologuard Results; forwarded to provider/SLS 09/04

## 2018-07-02 ENCOUNTER — Telehealth: Payer: Self-pay | Admitting: Pulmonary Disease

## 2018-07-02 NOTE — Telephone Encounter (Signed)
Prefilled Syringe: #150mg  1  #75mg  0 Ordered Date: 07/02/18 Shipping Date: 07/06/18

## 2018-07-03 DIAGNOSIS — J4540 Moderate persistent asthma, uncomplicated: Secondary | ICD-10-CM | POA: Diagnosis not present

## 2018-07-07 NOTE — Telephone Encounter (Signed)
Prefilled Syringes: # 150mg  1  #75mg  0 Arrival Date: 210/1/19 Lot #: 150mg  [id number]      75mg  0 Exp Date: 150mg  12/2018   75mg  0

## 2018-07-09 ENCOUNTER — Ambulatory Visit (INDEPENDENT_AMBULATORY_CARE_PROVIDER_SITE_OTHER): Payer: BLUE CROSS/BLUE SHIELD

## 2018-07-09 DIAGNOSIS — J4530 Mild persistent asthma, uncomplicated: Secondary | ICD-10-CM | POA: Diagnosis not present

## 2018-07-09 MED ORDER — OMALIZUMAB 150 MG ~~LOC~~ SOLR
150.0000 mg | SUBCUTANEOUS | Status: DC
  Administered 2018-07-09: 150 mg via SUBCUTANEOUS

## 2018-07-09 NOTE — Progress Notes (Signed)
Documentation of medication administration and charges of Xolair have been completed by Sabree Nuon, CMA based on the hand written Xolair documentation sheet completed by Tammy Scott, who administered the medication.  

## 2018-07-20 ENCOUNTER — Other Ambulatory Visit: Payer: Self-pay | Admitting: Pulmonary Disease

## 2018-07-22 ENCOUNTER — Other Ambulatory Visit: Payer: Self-pay | Admitting: Pulmonary Disease

## 2018-07-29 DIAGNOSIS — J4540 Moderate persistent asthma, uncomplicated: Secondary | ICD-10-CM | POA: Diagnosis not present

## 2018-08-03 ENCOUNTER — Telehealth: Payer: Self-pay | Admitting: Pulmonary Disease

## 2018-08-03 NOTE — Telephone Encounter (Signed)
Prefilled Syringe: #150mg  1  #75mg  0 Ordered Date: 08/03/18 Shipping Date: 08/03/18

## 2018-08-04 ENCOUNTER — Ambulatory Visit: Payer: BLUE CROSS/BLUE SHIELD | Admitting: Pulmonary Disease

## 2018-08-04 NOTE — Telephone Encounter (Signed)
Prefilled Syringes: # 150mg  1  #75mg  0 Arrival Date: 08/04/18 Lot #: 150mg  [id number]      75mg  0 Exp. Date: 150mg  01/2019   75mg  0

## 2018-08-06 ENCOUNTER — Other Ambulatory Visit (INDEPENDENT_AMBULATORY_CARE_PROVIDER_SITE_OTHER): Payer: BLUE CROSS/BLUE SHIELD

## 2018-08-06 ENCOUNTER — Encounter: Payer: Self-pay | Admitting: Pulmonary Disease

## 2018-08-06 ENCOUNTER — Ambulatory Visit: Payer: BLUE CROSS/BLUE SHIELD | Admitting: Pulmonary Disease

## 2018-08-06 VITALS — BP 126/80 | HR 74 | Temp 98.6°F | Ht 65.0 in | Wt 179.6 lb

## 2018-08-06 DIAGNOSIS — E663 Overweight: Secondary | ICD-10-CM

## 2018-08-06 DIAGNOSIS — E039 Hypothyroidism, unspecified: Secondary | ICD-10-CM

## 2018-08-06 DIAGNOSIS — E782 Mixed hyperlipidemia: Secondary | ICD-10-CM | POA: Diagnosis not present

## 2018-08-06 DIAGNOSIS — Z8659 Personal history of other mental and behavioral disorders: Secondary | ICD-10-CM | POA: Diagnosis not present

## 2018-08-06 DIAGNOSIS — J4530 Mild persistent asthma, uncomplicated: Secondary | ICD-10-CM | POA: Diagnosis not present

## 2018-08-06 DIAGNOSIS — K219 Gastro-esophageal reflux disease without esophagitis: Secondary | ICD-10-CM

## 2018-08-06 DIAGNOSIS — M159 Polyosteoarthritis, unspecified: Secondary | ICD-10-CM

## 2018-08-06 DIAGNOSIS — I872 Venous insufficiency (chronic) (peripheral): Secondary | ICD-10-CM

## 2018-08-06 DIAGNOSIS — J302 Other seasonal allergic rhinitis: Secondary | ICD-10-CM

## 2018-08-06 DIAGNOSIS — E559 Vitamin D deficiency, unspecified: Secondary | ICD-10-CM

## 2018-08-06 DIAGNOSIS — M150 Primary generalized (osteo)arthritis: Secondary | ICD-10-CM

## 2018-08-06 DIAGNOSIS — M8949 Other hypertrophic osteoarthropathy, multiple sites: Secondary | ICD-10-CM

## 2018-08-06 LAB — CBC WITH DIFFERENTIAL/PLATELET
Basophils Absolute: 0.1 10*3/uL (ref 0.0–0.1)
Basophils Relative: 1.3 % (ref 0.0–3.0)
Eosinophils Absolute: 0.4 10*3/uL (ref 0.0–0.7)
Eosinophils Relative: 5.3 % — ABNORMAL HIGH (ref 0.0–5.0)
HCT: 40.2 % (ref 36.0–46.0)
Hemoglobin: 14 g/dL (ref 12.0–15.0)
Lymphocytes Relative: 24.3 % (ref 12.0–46.0)
Lymphs Abs: 2.1 10*3/uL (ref 0.7–4.0)
MCHC: 34.8 g/dL (ref 30.0–36.0)
MCV: 90.6 fl (ref 78.0–100.0)
Monocytes Absolute: 0.5 10*3/uL (ref 0.1–1.0)
Monocytes Relative: 5.9 % (ref 3.0–12.0)
Neutro Abs: 5.4 10*3/uL (ref 1.4–7.7)
Neutrophils Relative %: 63.2 % (ref 43.0–77.0)
Platelets: 219 10*3/uL (ref 150.0–400.0)
RBC: 4.44 Mil/uL (ref 3.87–5.11)
RDW: 13.3 % (ref 11.5–15.5)
WBC: 8.5 10*3/uL (ref 4.0–10.5)

## 2018-08-06 LAB — COMPREHENSIVE METABOLIC PANEL
ALT: 24 U/L (ref 0–35)
AST: 16 U/L (ref 0–37)
Albumin: 4.5 g/dL (ref 3.5–5.2)
Alkaline Phosphatase: 86 U/L (ref 39–117)
BUN: 10 mg/dL (ref 6–23)
CO2: 32 mEq/L (ref 19–32)
Calcium: 9.6 mg/dL (ref 8.4–10.5)
Chloride: 103 mEq/L (ref 96–112)
Creatinine, Ser: 0.77 mg/dL (ref 0.40–1.20)
GFR: 80.81 mL/min (ref >60.00)
Glucose, Bld: 148 mg/dL — ABNORMAL HIGH (ref 70–99)
Potassium: 3.9 mEq/L (ref 3.5–5.1)
Sodium: 140 mEq/L (ref 135–145)
Total Bilirubin: 0.5 mg/dL (ref 0.2–1.2)
Total Protein: 6.9 g/dL (ref 6.0–8.3)

## 2018-08-06 LAB — LIPID PANEL
Cholesterol: 168 mg/dL (ref 0–200)
HDL: 42.7 mg/dL (ref >39.00)
LDL Cholesterol: 94 mg/dL (ref 0–99)
NonHDL: 125.72
Total CHOL/HDL Ratio: 4
Triglycerides: 159 mg/dL — ABNORMAL HIGH (ref 0.0–149.0)
VLDL: 31.8 mg/dL (ref 0.0–40.0)

## 2018-08-06 LAB — TSH: TSH: 2.42 u[IU]/mL (ref 0.35–4.50)

## 2018-08-06 MED ORDER — AMOXICILLIN-POT CLAVULANATE 875-125 MG PO TABS: 1.0000 | ORAL_TABLET | Freq: Two times a day (BID) | ORAL | 0 refills | Status: DC

## 2018-08-06 MED ORDER — MONTELUKAST SODIUM 10 MG PO TABS: 10.0000 mg | ORAL_TABLET | Freq: Every day | ORAL | 9 refills | Status: DC

## 2018-08-06 MED ORDER — PREDNISONE 20 MG PO TABS: ORAL_TABLET | ORAL | 0 refills | Status: DC

## 2018-08-06 MED ORDER — SIMVASTATIN 40 MG PO TABS: 40.0000 mg | ORAL_TABLET | Freq: Every day | ORAL | 9 refills | Status: DC

## 2018-08-06 MED ORDER — THYROID 60 MG PO TABS: 60.0000 mg | ORAL_TABLET | Freq: Every day | ORAL | 9 refills | Status: DC

## 2018-08-06 MED ORDER — FLUTICASONE-SALMETEROL 500-50 MCG/DOSE IN AEPB: INHALATION_SPRAY | RESPIRATORY_TRACT | 9 refills | Status: DC

## 2018-08-06 NOTE — Patient Instructions (Signed)
Today we updated your med list in our EPIC system...    Continue your current medications the same...  Today we checked your FASTING blood work...    We will contact you w/ the results when available...   We decided to treat your upper resp infection with>>    AUGMENTIN 875mg  #14 - one tab twice daily til gone...    PREDNISONE 10mg  tabs as follows:  Start w/ one tab twice daily for 3 days...  Then decrease to one tab each AM for 3 days...  Then decrease to 1/2 tab daily for 3 days...  Then decrease to 1/2 tab every other day til gone (1/2, 0, 1/2, 0, etc)...  Drink plenty of fluids, chicken soup, & hot tea... Consider taking a PROBIOTIC like Align one daily while on the antibiotic medication... You may use a saline nasal mist for your nose if necessary...  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    Sending you my best wishes for good health & much happiness in the years to come!!!

## 2018-08-07 ENCOUNTER — Encounter: Payer: Self-pay | Admitting: Pulmonary Disease

## 2018-08-07 NOTE — Progress Notes (Signed)
Subjective:    Patient ID: Amanda [name], female    DOB: January 24, 1956, 62 y.o.   MRN: [id number]  HPI 62 y/o WF here for a follow up visit... she has multiple medical problems as noted below... she was prev followed by Amanda [name] at [name] for her Asthma, then switched to Amanda [name], but both of them have left... I tried to get her to see Amanda [name] but pt has refused further referrals for her Asthma... the concern has been for the developement of fixed airway obstruction from her Asthma due to poor control and medication non-compliance in the past...  ~  SEE PREV EPIC NOTES FOR OLDER DATA >>    LABS 10/14:  FLP- at goals on Simva40;  Chems- ok x BS=118;  CBC- wnl;  TSH=0.43;  VitD=31 & rec to take 2000u daily...  LABS 10/15:  FLP- not at goal on Simva40 (?compliance);  Chems- ok w/ BS=108;  CBC- wnl x 8%eos;  TSH=6.70 & reminded to take her ArmourThyroid everyday;  VitD=22 & rec OTC VitD5000u daily...  ~  January 13, 2015:  24mo ROV & Amanda [name] reports breathing at baseline but seasonal allergies are kicking in; she also c/o some LBP, thinks it's her bed, offered XRays vs Ortho but she declines, advised heat/ back brace/ Tylenol etc... We reviewed the following medical problems during today's office visit >>     AR, Asthma> on Advair500Bid, ProairHFA prn, & Zyrtek10 (she stopped the Singulair on her own); she is an excellent Xolair cand but they couldn't get it worked out w/ her insurance (copay too $$); hasn't used any Pred in ?40yr...    Her medical management has been repeatedly hampered over the years by medication non-compliance...     Hyperlipid> on Simva40; last FLP 10/15 showed TChol 205, TG 72, HDL 42, LDL 149=> not as good as prev & I suspect compliance issues- asked to take it every eve, + better diet/ exerc/ wt reduction...    Hypothyroid> on ArmourThyroid60-2 tabs/d & she refuses Synthroid Rx; TSH 10/15 = 6.70 & she is clinically euthyroid, again reminded of the need for  100% compliance w/ dosing...    Overweight> wt down to 186# & we reviewed diet, exercise, wt reduction strategies...    HH, Reflux> on Protonix40Bid + Zantac75 as needed; she knows to use max antireflux regimen w/ elev HOB, NPO after dinner, etc...    GYN> prev seen by Amanda [name] but she stopped going when they didn't help her hot flashes; she is again advised to estab w/ another GYN for Pelvic, Pap, Mammography, BMD, etc (she still has not complied w/ this request)...     DJD> hx left knee pain & arthroscopy 1999 by Amanda [name]; she uses OTC analgesics as needed...    VitD defic> intermittently on VitD50K weekly from Amanda [name]; stopped on her own- took OTC VitD supplement but she stopped that too; last VitD level 10/14 = 31 & asked to resume daily vit supplements (never did- asked again)...    Anxiety> offered Alpraz or similar Benzo in past but she declines med rx... 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 PLAN>>  We discussed adding Spiriva Respimat- 2sp daily due to her Spirometry report; continue REGULAR DOSING of all meds including Advair500Bid; she refuses to  take Mucinex, refuses nasal sprays, refuses to sched w/ Gyn...  ~  July 18, 2015:  16mo ROV & Amanda [name] presents w/ recent incr SOB, cough, wheezing & chest tightness which she thinks is related to "weather changes and allergies";  Notes baseline DOE w/ "a bunch of stuff" like housework, taking out the trash, etc; we have discussed need for diet, exercise, wt reduction on mult occais but she has been unable to comply...    Asthma/ AB> on Advair500Bid, VentolinHFA prn (uses thisQid recently); she hasn't had Pred in a long time & stopped Spiriva & Singulair on her own; I have rec that she add INCRUSE one inhalation daily &  Pred20mg - tapering schedule...    HL> supposed to be on Simva40; [place] 10/16 shows TChol 219, TG 86, HDL 51, LDL 151 and a call to her Pharm indicates no recent refills of this med- we discussed taking the stain med every day, regularly, preferrably Qhs...    Hypothyroid> supposed to be on her Armour Thyroid 60-2tabs daily; LABS 07/18/15 showed TSH=10.51 and she is reminded to take it every day so we can determine her optimal dose...    Overweight>  Weight today is 188# up 2# & we reviewed diet/ exercise/ wt reduction...    Vit D defic>  Supposed to be on OTC Vit D supplement daily but she declines to take it regularly; LABS 07/2015 shows VitD level = 17 and asked to restart VitD 50K weekly dosing...  EXAM shows Afeb, VSS, O2sat=96% on RA;  HEENT- neg, mallampati2;  Chest- mild exp wheezing, scat rhonchi, no rales or consolidation;  Heart- RR w/o m/r/g;  Abd- soft, nontender, neg;  Ext- w/o c/c/e;  Neuro- intact...  LABS 10/17/14>  FLP- not at goals but she hasn't filled the Simva40 in several months;  Chems- wnl x BS=112;  CBC- wnl;  TSH=10.51;  VitD=16.6.Marland KitchenMarland Kitchen  IMP/PLAN>>  Amanda [name] has an AB exac & we will treat w/ Pred tapering sched but she needs to take her regular meds daily as maintenance, we discussed adding INCRUSE to her Advair;  Her LABS also reflect poor compliance w/ medications and she PROMISES to do better...  ~  January 16, 2016:  75mo ROV & Amanda [name] reports increased stress as her sister passed away in 2022-11-25 w/ lung cancer Amanda [name]- never smoked, treated in [place]);  She also notes that her asthma has been acting up lately and that her cat is ill- now required to live inside w/ resultant incr asthma symptoms as well; c/o nasal drainage, dry hacking cough, chest tight & wheezing, notes SOB/DOE w/ regular walking, mild cough & min beige sput; she states she had a weird reaction to Doxy prev & refuses to take this antibiotic again...     Allergies & cat now resides indoors>  We reviewed need for  antihist, Flonase, saline, and a good electrostatic air cleaner in her bedroom...    Asthma/ AB>  On Advair500Bid, ProairHFA- using it Bid, not taking the INCRUSE (she says "didn't have success w/ Spiriva or Incruse"), has Tessalon & Hycodan for prn use;       HL>  Says she is doing the simva40 daily now but not fasting today...    Hypothy>  She says she is taking the Armour thyroid60-2 Qam regularly; TSH today 01/16/16 = 0.10 indicating over-replaced when taking 120mg  every day- REC to decr to 60mg  daily...    Anxiety>  Her sister passed away, her cat is ill & living indoors now affecting  her allergies & asthma acting up; offered Alpraz or similar rx but she declines... EXAM shows Afeb, VSS, O2sat=95% on RA;  HEENT- neg, mallampati2;  Chest- mild exp wheezing, scat rhonchi, no rales or consolidation;  Heart- RR w/o m/r/g;  Abd- soft, nontender, neg;  Ext- w/o c/c/e;  Neuro- intact...  CXR 01/16/16>  Norm heart size, clear lungs w/ mild left basilar atx, DJD Tspine, NAD...   Spirometry 01/16/16>  FVC=1.88 (58%), FEV1=1.10 (43%), %1sec=58%, mid-flows reduced at 18% predicted;  This is c/o a mod obstructive ventilatory defect  And GOLD Stage3 COPD...]  LABS 01/16/16>  TSH= 0.10 on the Armour thyroid 60mg - 2/d... This is oversuppressed & needs to back off to 60mg /d. IMP/PLAN>>  Amanda [name] will not take any additional inhalers so we will Rx w/ Medrol 8mg  slow wean + her Advair500Bid & ProairHFA rescue;  She will re-double her efforts at allergy Rx w/ Antihist Qam, Flonase Bid, Saline as needed & get the electrostatic air cleaner for her bedroom;  Finally add vigorous antireflux regimen w/ Protonix40 before dinner, NPO after dinner (she refuses stating "I drink stuff"), Elev HOB 6"... Rov in ~6weeks recheck.  ~  March 19, 2016:  27mo ROV & Amanda [name] notes that her breathing is back to baseline- she notes that she has Amanda insurance now [facility] [employer]) & I feel it would be helpful to re-submit request for [facility] for her  Asthma, she is in agreement as a prev shot seemed to help but her insurance copay was prohibitive in past... Her CC is nocturnal leg pain x several weeks- down left leg & she denies injury, no LBP, & the pain is NOT present when she is on Medrol, OTC Advil w/ sl benefit as well; she notes some swelling as well; we decided to try Etodolac400Bid first- plus low sodium/ elevation/ support hose & consider further eval if not responding...     Allergies & cat now resides indoors>  We reviewed need for Antihist, Flonase, Saline, and a good electrostatic air cleaner in her bedroom...    Asthma/ AB>  On Advair500Bid, Singulair10, ProairHFA- using it Bid, not taking the INCRUSE (she says "didn't have success w/ Spiriva or Incruse"), has Tessalon & Hycodan for prn use;       HL>  Says she is doing the Simva40 daily now but not fasting today...    Hypothy>  She says she is taking the Armour Thyroid60-2 Qam regularly; TSH 01/16/16 = 0.10 indicating over-replaced when taking 120mg  every day- REC to decr to 60mg  daily...    Anxiety>  Her sister passed away 19-Nov-2022- lung cancer), her cat is ill & living indoors now affecting her allergies & asthma acting up; offered Alpraz or similar rx but she declines... EXAM shows Afeb, VSS, O2sat=95% on RA;  HEENT- neg, mallampati2;  Chest- mild exp wheezing, scat rhonchi, no rales or consolidation;  Heart- RR w/o m/r/g;  Abd- soft, nontender, neg;  Ext- w/o c/c/e;  Neuro- intact... IMP/PLAN>>  We decided to try the Etodolac400bid before getting additional w/u for the leg pain; rec to elim sodium/ elev legs/ use support hose of the swelling; we will request her Amanda insurance [employer] to approve XOLAIR for her severe asthma...  ~  July 25, 2016:  45mo ROV & Amanda [name] is here for a pulmonary recheck & general follow up visit>  During her last visit we prescribed Lodine400 to try for her leg pain before considering further eval- pt tells me that the Pharm hand-out said this  drug could cause  cancer so she never filled the Rx & didn't call to discuss or pick an alternative; she fell in June & injured her right ankle- chose to see a podiatrist s/p 4 visits and eventually given Lasix20 prn swelling, states her pain is pretty much gone now... we reviewed the following medical problems during today's office visit >>     Her medical management has been repeatedly hampered over the years by medication non-compliance...     AR> on OTC antihist prn, Singulair10/d; prev RAST testing 10/13 showed IgE=68, Rast panel w/ CAT>>dust>mold>dog; she has cats and asked to exclude them from the bedroom & set up air cleaner etc but she has not done this.    AR, Asthma> on Advair500Bid (but only taking once per day), Singulair10, ProairHFA prn (hasn't needed); she started XOLAIR in Aug2017- monthly injections; she hasn't used any Pred in ?68yr; unfortunately her PFTs showed mod airflow obstruction which I believe is fixed due to her poorly controlled asthma x many yrs; she has refused adding an anticholinergic like Spiriva or Incruse to her regimen...    LPR> on Protonix40 (declined to incr to Bid), Zantac Qhs (not taking); she was asked to elev [facility] & NPO after dinner but she has not complied w/ these requests either...    Hyperlipid> on Simva40; FLP 10/17 shows TChol 159, TG 64, HDL 51, LDL 95=> looks great w/ better Simva40 compliance + better diet/ exerc/ wt reduction...    Hypothyroid> she was on ArmourThyroid60-2 tabs/d & she refuses Synthroid Rx; TSH 4/17 = 0.10 & we rec decr to one 60gr tab daily; f/u TSH 10/17 on 60gr/d shows TSH= 0.54, continue same.    Overweight> wt stable at 188# & BMI=31-2; we reviewed diet, exercise, wt reduction strategies...    HH, Reflux> on Protonix40 + Zantac75 as needed; she knows to use max antireflux regimen w/ elev HOB, NPO after dinner, etc...    GYN> prev seen by [facility] but she stopped going when they didn't help her hot flashes; she is again advised to estab w/ another GYN  for Pelvic, Pap, Mammography, BMD, etc (she still has not complied w/ this request)...     DJD, ven insuffic> hx left knee pain & arthroscopy 1999 by Amanda [name]; she uses OTC analgesics as needed; mild VI & rec to elim salt, elev legs, wear support hose; she has Lasix20 prn edema...    VitD defic> intermittently on VitD50K weekly from [place]; stopped on her own- took OTC VitD supplement but she stopped that too; VitD level 10/17 = 32 & asked to resume daily vit supplements...    Anxiety> offered Alpraz or similar Benzo in past but she declines med rx... EXAM shows Afeb, VSS, O2sat=97% on RA;  HEENT- neg, mallampati2;  Chest- decr BS bilat but clear w/o w/r/r;  Heart- RR w/o m/r/g;  Abd- soft, min epig tender, neg;  Ext- w/o c/c/e;  Neuro- intact...  LABS 07/25/18>  FLP- all parameters at goals on Simva40;  Chems- ok x FBS=117 & we discussed diet/ wt reduction;  CBC- wnl x 8% eos;  TSH=0.54;  VitD=32... IMP/PLAN>> Amanda [name]'s medical issues are improved w/ better med compliance & it is hoped that her asthma will improve as well using her inhalers + Xolair (if not reponding then she'd be a cand for Nucala w/ incr eos);  Continue current rx & the ArmourThyroid60 + add VitD3 ~2000u daily... Note FBS at 117 & she must get on a low  carb/ no seets diet & get wt down to avoid DM meds in her future...  ~  January 27, 2017:  40mo ROV & Amanda [name]'s last Xolair shot was 10/31/16 as we continued to try & get insurance company approval [employer]) for this medication;  She has severe life-long asthma, never smoked, and has developed a component of fixed airway obstruction due to her severe asthma over many yrs;  We were able to try the Xolair & she has reported a fabulous response to this medication w/o exacerbations/ asthma attacks/ etc while on the drug;  Since she has been off she notes her symptoms have worsened w/ cough, sneezing, drainage, watery eyes, small amt of beige sput, no hemoptysis, no CP, but increased SOB-  progressive to dyspneic w/ min activ;  She is reminded to stay on her prescribed regimen of> ADVAIR500Bid, SINGULAIR10/d, ZYRTEK10/d, PROAIR-HFA rescue inhaler prn (she's been using this 4x per day recently), she refuses LAMA rx (we tried Spiriva & Incruse but compliance was poor & she felt benefit was min); Note- last Prednisone usage was >21yr ago she says... We reviewed above problem list & meds...    EXAM shows Afeb, VSS, O2sat=95% on RA;  Wt=187#, BMI=31-2;  HEENT- neg, mallampati2;  Chest- decr BS bilat but clear w/o w/r/r;  Heart- RR w/o m/r/g;  Abd- soft, min epig tender, neg;  Ext- w/o c/c/e;  Neuro- intact...  CXR 01/27/17 (independently reviewed by me in the PACS system) showed norm heart size, clear lungs, NAD... IMP/PLAN>>  Amanda [name] has a long hx of severe difficult to control asthma, she has had a prob w/ medication compliance over the yrs, she has required freq courses of Prednisone in the past;  Since starting on xolair from the manufacturer she has noticed a signif improvement in her asthma control 7 has not had any exac, unfortunately we are unable to get her any more drug w/o her insurance company approval for the [place]- we are continuing our prior authorization process and letter writing campaign to procure this med on her behalf; in the meanwhile she will continue our chronic regimen as above PLUS control of cat dander, antireflux regimen, etc...     NOTE: >50% of this 92min ov was spent in counseling & coordination of care...  ~  July 29, 2017:  60mo ROV & Amanda [name] reports that her breathing is doing well on her [place] Right monthly- she denies cough, sputum, hemoptysis, CP, and notes SOB is stable- does yard & housework w/o change from her baseline; still not exercising! We reviewed the following medical problems during today's office visit>      AR> on OTC antihist prn, Singulair10/d; prev RAST testing 10/13 showed IgE=68, Rast panel w/ CAT>>dust>mold>dog; she has cats and asked to exclude  them from the bedroom & set up air cleaner etc but she has not done this.    AR, Asthma> on Advair500Bid (but only taking once per day), Singulair10, ProairHFA prn (hasn't needed); she started XOLAIR in Aug2017- monthly injections; she hasn't used any Pred in ?13yr; unfortunately her PFTs showed mod airflow obstruction which I believe is fixed due to her poorly controlled asthma x many yrs; she has refused adding an anticholinergic like Spiriva or Incruse to her regimen...    LPR> on Protonix40 (declined to incr to Bid), Zantac Qhs (not taking); she was asked to elev [facility] & NPO after dinner but she has not complied w/ these requests either...    Hyperlipid> on Simva40; [place] 10/18 shows TChol 145,  TG 68, HDL 52, LDL 80=> looks great w/ better Simva40 compliance + better diet/ exerc/ wt reduction...    Hypothyroid> she was on ArmourThyroid60-1 tab/d & she refuses Synthroid Rx; TSH 10/18 = 2.60 & rec to continue the same...    Overweight> wt stable at 188# & BMI=31-2; we reviewed diet, exercise, wt reduction strategies...    HH, Reflux> on Protonix40 + Zantac75 HS as needed; she knows to use max antireflux regimen w/ elev HOB, NPO after dinner, etc... She has refused GI referral for colonoscopy...    GYN> prev seen by [facility] but she stopped going when they didn't help her hot flashes; she is again advised to estab w/ another GYN for Pelvic, Pap, Mammography, BMD, etc (she still has not complied w/ this request)...     DJD, ven insuffic> hx left knee pain & arthroscopy 1999 by Amanda [name]; she uses OTC analgesics as needed; mild VI & rec to elim salt, elev legs, wear support hose; she has Lasix20 prn edema...    VitD defic> intermittently on VitD50K weekly from [place]; stopped on her own- took OTC VitD supplement but she stopped that too; VitD level 10/17 = 32 & asked to resume daily vit supplements...    Anxiety> offered Alpraz or similar Benzo in past but she declines med rx... EXAM shows Afeb,  VSS, O2sat=97% on RA;  Wt=188#, BMI=31-2;  HEENT- neg, mallampati2;  Chest- decr BS bilat but clear w/o w/r/r;  Heart- RR w/o m/r/g;  Abd- soft, min epig tender, neg;  Ext- w/o c/c/e;  Neuro- intact...  LABS 07/29/17>  FLP- all parameters at goals on Simva40;  Chems- wnl x BS=106;  CBC- wnl x eos=7.7%;  TSH=2.60;  VitD=25 (30-100)... IMP/PLAN>>  Amanda [name] is overall stable on her Advair, Singulair, Xolair- continue same;  Needs better diet, wt reduction, & exercise program- suggest gym/ [employer]/ etc;  Labs look good- needs low carb wt reducing diet, rec to incr VitD supplement;  She needs GYN follow up w/ mammogram, PAP, BMD;  She needs GI for colonoscopy...   ~  January 27, 2018:  45mo ROV & when last seen 07/2017 Amanda [name] was stable on her Xolair Qmo, and we stressed diet/ exercise/ wt reduction, and asked her to incr her VitD supplement; In addition she needed GYN check for mammogram, PAP smear, BMD, and a GI eval for colonoscopy;  She really hasn't done any of this (except incr VitD to 2000u daily), and she tells me she signed up w/ PCP- Dr. Janett Billow [facility]land [facility]) w/ an appt sched for July...  Due to an insurance change she went without her xolair for 2 months, but back on now & really helping- states she's not even having any problem w/ her cats at home!  She notes min cough (dry) w/o sput or congestion, notes rare wheeze but denies f/c/s; only notes SOB if she overdoes it "I'm out of shape"; she is caring for her 91 y/o mother who lives with her; denies CP/ palpit/ edema;  Amanda [name] continues to refuse Flu shots and Pneumonia shots...     ROS> she has had some foot pain and saw Podiatrist- [facility], [facility], on 08/21/17-- R foot pain x several months, note reviewed, Dx w/ peroneal tendonitis, XRays done, and rec to take NSAIDs as needed, wear stiff soled shoes, apply ice;  We discussed that appt & I offered pt referral to [facility] for eval if she desires & she will let us know... We  reviewed  the following medical problems during today's office visit>      AR> on OTC antihist prn, Singulair10/d; prev RAST testing 10/13 showed IgE=68, Rast panel w/ CAT>>dust>mold>dog; she has cats and asked to exclude them from the bedroom & set up air cleaner etc but she has not done this.    AR, Asthma> on Advair500Bid (but only taking once per day), Singulair10, ProairHFA prn (hasn't needed); she started [place] in Aug2017- monthly injections & really helping (she hasn't used any Pred in >8yr); unfortunately her PFTs showed mod airflow obstruction which I believe is fixed due to her poorly controlled asthma x many yrs in the past; she has refused adding an anticholinergic like Spiriva or Incruse to her regimen!    LPR> on Protonix40 (declined to incr to Bid), Zantac Qhs (not taking); she was asked to elev [facility] & NPO after dinner but she has not complied w/ these requests either...    Hyperlipid> on Simva40; FLP 10/18 shows TChol 145, TG 68, HDL 52, LDL 80=> looks great w/ better Simva40 compliance + better diet/ exerc/ wt reduction...    Hypothyroid> she is on ArmourThyroid60-1 tab/d & she refuses Synthroid Rx; TSH 10/18 = 2.60 & rec to continue the same...    Overweight> wt stable at 184# & BMI=31; we reviewed diet, exercise, wt reduction strategies...    HH, Reflux> on Protonix40 + Zantac75 HS as needed; she knows to use max antireflux regimen w/ elev HOB, NPO after dinner, etc... She has refused GI referral for colonoscopy...    GYN> prev seen by [facility] but she stopped going when they didn't help her hot flashes; she is again advised to estab w/ another GYN for Pelvic, Pap, Mammography, BMD, etc (she still has not complied w/ this request)...     DJD, ven insuffic> hx left knee pain & arthroscopy 1999 by Amanda [name]; she uses OTC analgesics as needed; mild VI & rec to elim salt, elev legs, wear support hose; she has Lasix20 prn edema...    VitD defic> intermittently on VitD50K weekly from [place]; stopped on her own- took OTC VitD supplement but she stopped that too; VitD level 10/17 = 32 & asked to resume 1000u supplement daily; 10/18 VitD level=25 & asked to incr VitD2000u/d.    Anxiety> offered Alpraz or similar Benzo in past but she declines med rx... EXAM shows Afeb, VSS, O2sat=96% on RA;  Wt=184#, BMI=31-2;  HEENT- neg, mallampati2;  Chest- decr BS bilat but clear w/o w/r/r;  Heart- RR w/o m/r/g;  Abd- soft, min epig tender, neg;  Ext- w/o c/c/e;  Neuro- intact... IMP/PLAN>>  Amanda [name] is stable & doing very well on the xolair- continue same meds, discussed compliance w/ medical regimen, etc;  We reviewed diet/ exercise/ wt reduction strategies, and plan recheck in 6 months, sooner if needed prn...   ~  August 06, 2018:  73mo ROV & pulmonary follow up visit>  She has established PCP w/ Dr. Janett Billow [facility]land at the [facility] office...             Problem List:  ALLERGIC RHINITIS (ICD-477.9)  ++allergy testing by [facility] in the 1980's w/ reactions to grasses, ragweed, dust, molds, cats, dog... prev on shots... ~  10/12:  She reports a Amanda kitten at home, but denies allergy or asthma exac so far... ~  10/13:  I cannot find documentation of IgE/ RAST testing (?done at [facility] in the past, she was in a drug trial 2006 at [name]);  we sent labs today> IgE=  68 & RAST pos for Cat> Dust> Dog> molds...  We decided to apply for [facility] Rx => they could not secure payment from her insurance... ~  10/14:  on Zyrtek, Singulair10, Advair500, AlbutHFA prn; breathing has been good, allergies acting up & she has dermatitis on face- wants Dosepak- ok ~  4/15: presents c/o spring allergy symptoms on Singulair10 & Zyrtek OTC, refuses nasal sprays; Rec Depo80, Pred dosepak, etc... ~  4/16: this spring hasn't been so bad, yet; she stopped the Singulair on her own, won't use any nasal sprays; advised to call if round of Pred needed... ~  4/17: on OTC antihist prn, Singulair10/d; prev RAST  testing 10/13 showed IgE=68, Rast panel w/ CAT>>dust>mold>dog; she has cats and asked to exclude them from the bedroom & set up air cleaner etc but she has not done this.  ASTHMA (ICD-493.90) - on ADVAIR 500Bid, & VENTOLIN HFA as needed... she admits to using Advair once dailly & is encouraged to take meds regularly as perscribed; similarly she overuses the rescue inhaler & admonished to not use it more than Qid... ~  baseline CXR shows sl incr markings, NAD... ~  PFT's 5/05 showed FVC= 2.47 (76%), FEV1= 1.51 (56%), %1sec=61, mid-flows= 22%pred... ~  in 2006 she entered the EXTRA trial at [facility] which was a double blind trial testing XOLAIR... ~  last note from [facility] 6/07 DrCarraway w/ concern for her fixed airway obstruction... pt refused f/u in the [place]... ~  f/u PFT 10/11 showed FVC= 2.52 (71%), FEV1= 1.69 (60%), %1sec=67, mid-flows= 38%pred. ~  CXR 10/11 showed clear lungs, NAD... ~  10/13:  Treated for exac w/ Pred course; rechecked labs including IgE= 68 & RAST pos for Cat> Dust> Dog> molds... We decided to apply for [facility] but they could not arrange payment from her insurance... She states that the 1st shot really helped... ~  10/14: on Zyrtek, Singulair10, Advair500, AlbutHFA prn; breathing has been good, allergies acting up & she has dermatitis on face- wants Dosepak- ok. ~  4/15: on Zyrtek10, Advair500Bid, Singulair10, ProairHFA prn (lately incr to qid); she is an excellent Xolair cand but they couldn't get it worked out w/ her insurance; hasn't used any Pred in >25mo til this visit w/ spring pollen. ~  10/15: on same meds but only doing the Advair once/d & not regular w/ the Singulair; as a result she continues to use the rescue inhaled 1-2/wk she says; we discussed taking meds regularly... 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 ~  05/2016> she has restarted the Xolair due to better coverage from her [facility] [employer] [employer] insurance...  HYPERLIPIDEMIA (ICD-272.4) - prev followed by DrDoerr, Endocrinology in [place]... prev on Zetia, now on SIMVASTATIN 40mg /d but admits to intermittent dosing. ~  4/11: pt presents w/ request to start writing Simva Rx & monitor labs since DrDoerr has left [place]... encouraged to take  med regularly & follow low chol/ low fat diet... ~  FLP 10/11 on Simva40 showed TChol 198, TG 110, HDL 46, LDL 131 ~  FLP 10/12 on Simva40 showed TChol 172, TG 82, HDL 51, LDL 105... Improved, reminded of diet & compliance. ~  FLP 10/13 on Simva40 (not taking daily) showed TChol 229, TG 60, HDL 48, LDL 170==> rec to take med daily & refer to [facility]! ~  FLP 10/14 on Simva40 showed TChol 138, TG 78, HDL 42, LDL 81... Great job, continue daily dosing... ~  [place] 10/15 on Simva40 (?compliance) showed TChol 205, TG 72, HDL 42, LDL 149... Reminded to take it everyday, low fat diet & get the wt down. ~  FLP 10/16 on Simva40 ?compliance showed TChol 219, TG 86, HDL 51, LDL 151... Discussed taking it everyday! ~  [place] 10/17 on simva40 w/ better compliance sowed TChol 159, TG 64, HDL 51, LDL 95 & this is much improved...  HYPOTHYROIDISM (ICD-244.9) - taking ARMOUR THYROID 60mg - 2tabs daily... she was followed by DrDoerr, Endocrine in [place] for thyroid and her hyperlipidemia... she was prev on Levothyroid and had requested change to Armour Thyroid because she believed the Levothy was making her hair fall out... ~   4/11:  pt reqested that we write her Rx for the Armour Thyroid 60mg  since DrDoerr has left [place]... pt is uncouraged to take the med daily. ~  labs 10/11 showed TSH= 4.69 ~  Labs 10/12 on ArmourThyroid60-2/d showed TSH= 2.88... rec to continue same. ~  Labs 10/13 on ArmourThyroid60-2/d (not taking daily) showed TSH= 5.91==> rec to take med every day!!! ~  Labs 10/14 on ArmourThyroid60-2/d showed TSH=0.43 ~  Labs 10/15 on ArmourThyroid60-2/d showed TSH=6.70 and reminded to take all meds every day... ~  Labs 10/16 on Armour Thyroid60-2/d showed TSH=10.51 and we again discussed taking the med every day, 1st thing in AM etc... ~  Labs 4/17 on Armour Thyroid60-2/d & she assures me taking it regularly showed TSH=0.10! Therefore decr to one tab per day... ~  Labs 10/17 on Armour thyroid60/d showed TSH= 0.54 & rec to continue the same...  OVERWEIGHT (ICD-278.02) - we reviewed diet + exercise program required to lose weight... ~  weight 9/10 = 200#,  she is 5\' 5"  tall,  BMI= 34. ~  weight 4/11 = 192#,  this is as good as she's been in several yrs. 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 she continues to treat her reflux as needed w/ the Nexium and OTC Zantac even though we have felt this plays a signif roll in her Asthma... the consultants at [name] agreed, but were no more effective at getting her to take her meds regularly... 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 ~  meds re-written & encouraged to take PROTONIX 40mg /d- 30 min before the 1st meal, and Zantac 75- 1-2 at bedtime... ~  4/14: she requests switch to NEXIUM40  from the Protonix... ~  She has been poorly compliant w/  her antireflux regimen...  GYN = DrRomaine in the past... pt states that she stopped going because she couldn't help her w/ her hot flashes. ~  4/14: reminded of the importance of regular Gyn checks- pelvic, Pap, Mammograms, etc...  OSTEOARTHRITIS (ICD-715.90) - she had a left knee arthroscopy by Amanda [name] in 1999...  VITAMIN D DEFICIENCY (ICD-268.9) - pt was on Vit D 50,000 u weekly from DrDoerr... we don't have prev labs from her but pt indicates that the Vit D level was "really really low" so we will refill the Rx & f/u Vit D level later. ~  labs 10/11 showed Vit D level = 44... OK to switch to 1-2000u daily OTC supplement. ~  4/12:  Pt requested to go back on Vit D 50000u weekly... ~  10/12:  Pt indicated that she stopped filling the Rx for Vit D 50K weekly, ?why? ~  BMD 5/14 was wnl w/ lowest Tscore = -0.2.Marland KitchenMarland Kitchen ~  10/14: she stopped the VitD 50K weekly Rx on her own; Labs 10/14 showed VitD level = 31 & she is requested to take 2000u OTC supplement daily  ~  Labs 10/15 showed VitD level = 22 and she is rec to take OTC VitD supplement ~5000u daily... ~  Pt reminded to take all meds regularly + OTC VitD supplements... ~  Labs 10/17 ?on VitD 1000u daily showed VitD level = 32... rec to incr to 2000u OTC VitD3 supplement daily...  ANXIETY (ICD-300.00)  Hx of SHINGLES (ICD-053.9) - she presented w/ cryptic left flank pain 12/09 that turned out to be Shingles... treated w/ Valtrex, Pred, Vicodin.  HEALTH MAINTENANCE:   ~  GI:  She has not established w/ a GI for routine screening colonoscopy- reminded again about this important screening procedure! ~  GYN:  GYN = DrRomaine in the past... pt states that she stopped going because she couldn't help her w/ her hot flashes; she is reminded of the need to establish w/ another GYN for PAP, Mammograms, BMD etc... ~  Immuniz:  she refuses Pneumovax & Flu vaccine... She received TDAP 2012 prior to a  trip to [place].    Past Surgical History:  Procedure Laterality Date  . breast reductions  2000   [place]  . left knee arthroscopy  1999   by Dr. Eddie Dibbles  . REDUCTION MAMMAPLASTY      Outpatient Encounter Medications as of 08/06/2018  Medication Sig  . aspirin 81 MG tablet Take 81 mg by mouth daily.   . cetirizine (ZYRTEC) 10 MG tablet Take 10 mg by mouth daily as needed.  . cholecalciferol (VITAMIN D) 1000 units tablet Take 2,000 Units by mouth daily.   . Fluticasone-Salmeterol (ADVAIR DISKUS) 500-50 MCG/DOSE AEPB INHALE ONE PUFF BY MOUTH EVERY TWELVE HOURS  . lisinopril (PRINIVIL,ZESTRIL) 5 MG tablet Take 5 mg by mouth daily.  . montelukast (SINGULAIR) 10 MG tablet Take 1 tablet (10 mg total) by mouth at bedtime.  . Omalizumab (XOLAIR [place]) Inject into the skin.  . pantoprazole (PROTONIX) 40 MG tablet TAKE 1 TABLET (40 MG TOTAL) BY MOUTH 2 (TWO) TIMES DAILY. (Patient taking differently: 40 mg daily. )  . ranitidine (ZANTAC) 75 MG tablet Take 1 tablet (75 mg total) by mouth at bedtime as needed. (Patient taking differently: Take 75 mg by mouth at bedtime as needed. As needed)  . simvastatin (ZOCOR) 40 MG tablet Take 1 tablet (40 mg total) by mouth at bedtime.  Marland Kitchen thyroid (ARMOUR THYROID) 60 MG tablet Take 1  tablet (60 mg total) by mouth daily.  . VENTOLIN HFA 108 (90 Base) MCG/ACT inhaler INHALE 2 PUFFS INTO THE LUNGS EVERY 6 (SIX) HOURS AS NEEDED FOR WHEEZING.  Arvid Right 150 MG/ML SOSY INJECT 1 SYRINGE UNDER THE SKIN EVERY 4 WEEKS  . [DISCONTINUED] ADVAIR DISKUS 500-50 MCG/DOSE AEPB INHALE ONE PUFF BY MOUTH EVERY TWELVE HOURS  . [DISCONTINUED] albuterol (PROVENTIL HFA;VENTOLIN HFA) 108 (90 Base) MCG/ACT inhaler Inhale 2 puffs into the lungs every 6 (six) hours as needed for wheezing.  . [DISCONTINUED] ARMOUR THYROID 60 MG tablet TAKE 1 TABLET BY MOUTH ONCE DAILY  . [DISCONTINUED] lisinopril (PRINIVIL,ZESTRIL) 2.5 MG tablet Take 2.5 mg by mouth daily.  . [DISCONTINUED] lisinopril  (PRINIVIL,ZESTRIL) 5 MG tablet Take 1 tablet (5 mg total) by mouth daily.  . [DISCONTINUED] montelukast (SINGULAIR) 10 MG tablet TAKE 1 TABLET (10 MG TOTAL) BY MOUTH AT BEDTIME.  . [DISCONTINUED] Pseudoephedrine-Ibuprofen (ADVIL COLD & SINUS LIQUI-GELS) 30-200 MG CAPS As needed  . [DISCONTINUED] simvastatin (ZOCOR) 40 MG tablet TAKE 1 TABLET (40 MG TOTAL) BY MOUTH AT BEDTIME.  Marland Kitchen amoxicillin-clavulanate (AUGMENTIN) 875-125 MG tablet Take 1 tablet by mouth 2 (two) times daily.  . predniSONE (DELTASONE) 20 MG tablet 1 tab twicedailyx3days,1/2tabx3 days,1/2tabeveryotherdaytilgone   Facility-Administered Encounter Medications as of 08/06/2018  Medication  . omalizumab Arvid Right) injection 150 mg  . omalizumab Arvid Right) injection 150 mg    Allergies  Allergen Reactions  . Azithromycin     zpak does not work for the pt    Immunization History  Administered Date(s) Administered  . Tdap 01/21/2011  Maicy has declined FLU shots and PNEUMONIA vaccinations...   Current Medications, Allergies, Past Medical History, Past Surgical History, Family History, and Social History were reviewed in [employer] record.    Review of Systems       See HPI - all other systems neg except as noted...      The patient complains of dyspnea on exertion.  The patient denies anorexia, fever, weight loss, weight gain, vision loss, decreased hearing, hoarseness, chest pain, syncope, peripheral edema, prolonged cough, headaches, hemoptysis, abdominal pain, melena, hematochezia, severe indigestion/heartburn, hematuria, incontinence, muscle weakness, suspicious skin lesions, transient blindness, difficulty walking, depression, unusual weight change, abnormal bleeding, enlarged lymph nodes, and angioedema.     Objective:   Physical Exam      WD, overweight, 62 y/o WF in NAD... GENERAL:  Alert & oriented; & cooperative... HEENT:  [place]/AT, EOM-wnl, PERRLA, Fundi-benign, EACs-clear, TMs-wnl,  NOSE-clear, THROAT-clear & wnl. NECK:  Supple w/ full ROM; no JVD; normal carotid impulses w/o bruits; no thyromegaly or nodules palpated; no lymphadenopathy. CHEST:  Clear to P & A; decr BS bilat; without wheezes/ rales/ or rhonchi heard... HEART:  Regular Rhythm; without murmurs/ rubs/ or gallops detected... ABDOMEN:  Soft & nontender; normal bowel sounds; no organomegaly or masses palpated... EXT: without deformities, mild arthritic changes; no varicose veins/ venous insuffic/ or edema. NEURO:  CN's intact; no focal neuro deficits... DERM:  No lesions noted; no rash etc...  RADIOLOGY DATA:  Reviewed in the EPIC EMR & discussed w/ the patient...  LABORATORY DATA:  Reviewed in the EPIC EMR & discussed w/ the patient...   Assessment & Plan:    ASTHMA>  Asked to use the Advair Bid regularly & gauge control by NOT having to use the rescue inhaler... IgE= 68 w/ pos RAST to Cats, Dust, Dog, some molds & we tried for Xolair treatment but unfortunately this could not be arranged due to her  insurance...  4/14> try Singulair=> she wants to continue this rx... 4/15> springtime exac due to pollen & treated w/ Depo80, dosepak, Hycodan... 10/15> reminded to take all regular meds REGULARLY! 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 10/16> she presents w/ an exac, only taking Advair Bid & VentolinHFA prn, she stopped the Spiriva & ?if using the Advair every day; we reviewed the need for daily meds- try Advair500Bid & Incruse daily, plus Rx Pred taper... 4/17> still not controlled & Rx hampered by medication compliance; we Rx w/ Medrol taper + her Advair500 & ProairHFA, needs allergen management & antireflux regimen... 6/17> she feels she is back to baseline; has [facility] insurance & we will try for Arvid Right again => restarted 05/2016 & improved... 10/17> Amanda [name]'s medical issues  are improved w/ better med compliance & it is hoped that her asthma will improve as well using her inhalers + Xolair (if not reponding then she'd be a cand for Nucala w/ incr eos);  Continue current rx & the ArmourThyroid60 + add VitD3 ~2000u daily... Note FBS at 117 & she must get on a low carb/ no seets diet & get wt down to avoid DM meds in her future 01/27/17>  Amanda [name] has a long hx of severe difficult to control asthma, she has had a prob w/ medication compliance over the yrs, she has required freq courses of Prednisone in the past;  Since starting on xolair from the manufacturer she has noticed a signif improvement in her asthma control 7 has not had any exac, unfortunately we are unable to get her any more drug w/o her insurance company approval for the [place]- we are continuing our prior authorization process and letter writing campaign to procure this med on her behalf; in the meanwhile she will continue our chronic regimen as above PLUS control of cat dander, antireflux regimen, etc.. 07/29/17>  Amanda [name] is overall stable on her Advair, Singulair, Xolair- continue same;  Needs better diet, wt reduction, & exercise program- suggest gym/ [employer]/ etc;  Labs look good- needs low carb wt reducing diet, rec to incr VitD supplement;  She needs GYN follow up w/ mammogram, PAP, BMD;  She needs GI for colonoscopy 01/27/18>   Amanda [name] is stable & doing very well on the xolair- continue same meds, discussed compliance w/ medical regimen, etc;  We reviewed diet/ exercise/ wt reduction strategies, and plan recheck in 6 months, sooner if needed prn.Marland Kitchen   HYPERLIPID>  On Simva40 & asked to take it nightly to gauge it's effectiveness; FLP improved w/ more regular dosing; Needs better diet & wt loss...  HYPOTHYROID>  On Armor Thyroid which she insists upon (prev from DrDoerr); TSH looks good when she takes it everyday; Clinically euthyroid, Continue same...  HH/ REFLUX>  Asked to be diligent w/ antireflux measures including  the Protonix40Bid & ZantacQhs if nec...  Vit D Deif>  she stopped the 50K treatment on her own; VitD level 10/15= 22 & asked to take 5000u daily OTC supplement...  OVERWEIGHT>  We reviewed diet + exercise needed to lose wt...  Other medical issues as noted...                                      Patient's Medications  Amanda Prescriptions   AMOXICILLIN-CLAVULANATE (AUGMENTIN) 875-125  MG TABLET    Take 1 tablet by mouth 2 (two) times daily.   PREDNISONE (DELTASONE) 20 MG TABLET    1 tab twicedailyx3days,1/2tabx3 days,1/2tabeveryotherdaytilgone  Previous Medications   ASPIRIN 81 MG TABLET    Take 81 mg by mouth daily.    CETIRIZINE (ZYRTEC) 10 MG TABLET    Take 10 mg by mouth daily as needed.   CHOLECALCIFEROL (VITAMIN D) 1000 UNITS TABLET    Take 2,000 Units by mouth daily.    LISINOPRIL (PRINIVIL,ZESTRIL) 5 MG TABLET    Take 5 mg by mouth daily.   OMALIZUMAB (XOLAIR [place])    Inject into the skin.   PANTOPRAZOLE (PROTONIX) 40 MG TABLET    TAKE 1 TABLET (40 MG TOTAL) BY MOUTH 2 (TWO) TIMES DAILY.   RANITIDINE (ZANTAC) 75 MG TABLET    Take 1 tablet (75 mg total) by mouth at bedtime as needed.   VENTOLIN HFA 108 (90 BASE) MCG/ACT INHALER    INHALE 2 PUFFS INTO THE LUNGS EVERY 6 (SIX) HOURS AS NEEDED FOR WHEEZING.   XOLAIR 150 MG/ML SOSY    INJECT 1 SYRINGE UNDER THE SKIN EVERY 4 WEEKS  Modified Medications   Modified Medication Previous Medication   FLUTICASONE-SALMETEROL (ADVAIR DISKUS) 500-50 MCG/DOSE AEPB ADVAIR DISKUS 500-50 MCG/DOSE AEPB      INHALE ONE PUFF BY MOUTH EVERY TWELVE HOURS    INHALE ONE PUFF BY MOUTH EVERY TWELVE HOURS   MONTELUKAST (SINGULAIR) 10 MG TABLET montelukast (SINGULAIR) 10 MG tablet      Take 1 tablet (10 mg total) by mouth at bedtime.    TAKE 1 TABLET (10 MG TOTAL) BY MOUTH AT BEDTIME.   SIMVASTATIN (ZOCOR) 40 MG TABLET simvastatin (ZOCOR) 40 MG tablet      Take 1 tablet (40 mg total) by mouth at bedtime.    TAKE 1 TABLET (40 MG TOTAL) BY MOUTH AT BEDTIME.    THYROID (ARMOUR THYROID) 60 MG TABLET ARMOUR THYROID 60 MG tablet      Take 1 tablet (60 mg total) by mouth daily.    TAKE 1 TABLET BY MOUTH ONCE DAILY  Discontinued Medications   ALBUTEROL (PROVENTIL HFA;VENTOLIN HFA) 108 (90 BASE) MCG/ACT INHALER    Inhale 2 puffs into the lungs every 6 (six) hours as needed for wheezing.   LISINOPRIL (PRINIVIL,ZESTRIL) 2.5 MG TABLET    Take 2.5 mg by mouth daily.   LISINOPRIL (PRINIVIL,ZESTRIL) 5 MG TABLET    Take 1 tablet (5 mg total) by mouth daily.   PSEUDOEPHEDRINE-IBUPROFEN (ADVIL COLD & SINUS LIQUI-GELS) 30-200 MG CAPS    As needed

## 2018-08-07 NOTE — Progress Notes (Signed)
Called pt She was busy and could not talk and said she would call back on 08/10/18 to discuss labs

## 2018-08-09 LAB — VITAMIN D 1,25 DIHYDROXY
Vitamin D 1, 25 (OH)2 Total: 64 pg/mL (ref 18–72)
Vitamin D2 1, 25 (OH)2: <8 pg/mL
Vitamin D3 1, 25 (OH)2: 64 pg/mL

## 2018-08-10 ENCOUNTER — Ambulatory Visit (INDEPENDENT_AMBULATORY_CARE_PROVIDER_SITE_OTHER): Payer: BLUE CROSS/BLUE SHIELD

## 2018-08-10 DIAGNOSIS — J4530 Mild persistent asthma, uncomplicated: Secondary | ICD-10-CM | POA: Diagnosis not present

## 2018-08-10 MED ORDER — OMALIZUMAB 150 MG ~~LOC~~ SOLR
150.0000 mg | SUBCUTANEOUS | Status: DC
  Administered 2018-08-10: 150 mg via SUBCUTANEOUS

## 2018-08-10 NOTE — Progress Notes (Signed)
Documentation of medication administration and charges of Xolair have been completed by Lindsay Lemons, CMA based on the hand written Xolair documentation sheet completed by Tammy Scott, who administered the medication.  

## 2018-08-12 ENCOUNTER — Telehealth: Payer: Self-pay | Admitting: Pulmonary Disease

## 2018-08-12 NOTE — Telephone Encounter (Signed)
Called patient unable to reach left message to give us a call back.

## 2018-08-14 NOTE — Telephone Encounter (Signed)
Attempted to contact pt. I did not receive an answer. I have left a message for pt to return our call.  

## 2018-08-18 NOTE — Telephone Encounter (Signed)
Per Salome Arnt, LPN's documentation, she has already spoken with the pt regarding her results. Message will be closed.

## 2018-08-27 DIAGNOSIS — J4540 Moderate persistent asthma, uncomplicated: Secondary | ICD-10-CM | POA: Diagnosis not present

## 2018-09-02 ENCOUNTER — Telehealth: Payer: Self-pay | Admitting: Pulmonary Disease

## 2018-09-02 NOTE — Telephone Encounter (Signed)
Prefilled Syringes: # 150mg  1  #75mg  0 Arrival Date: 09/02/18 Lot #: 150mg  [id number]      75mg  0 Exp Date: 150mg  02/2019   75mg  0

## 2018-09-03 IMAGING — MG DIGITAL SCREENING BILATERAL MAMMOGRAM WITH TOMO AND CAD
8 series · 8 of 24 positions shown · non-contrast
Comparison: Previous exam(s).

ACR Breast Density Category a: The breast tissue is almost entirely
fatty.

CLINICAL DATA: Screening.

EXAM:
DIGITAL SCREENING BILATERAL MAMMOGRAM WITH TOMO AND CAD

[R MLO synth-2D]
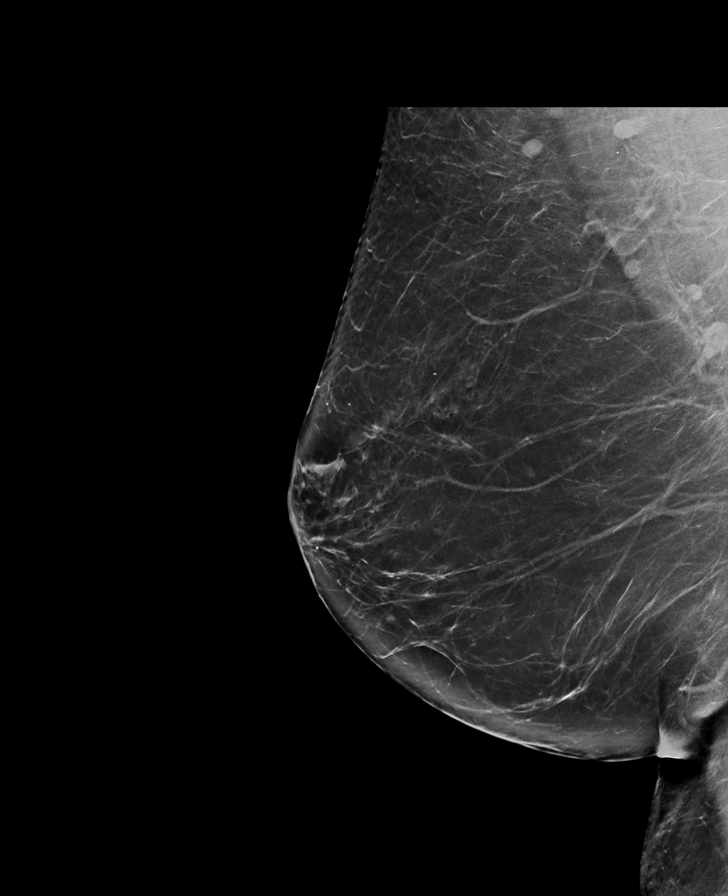

[R CC synth-2D]
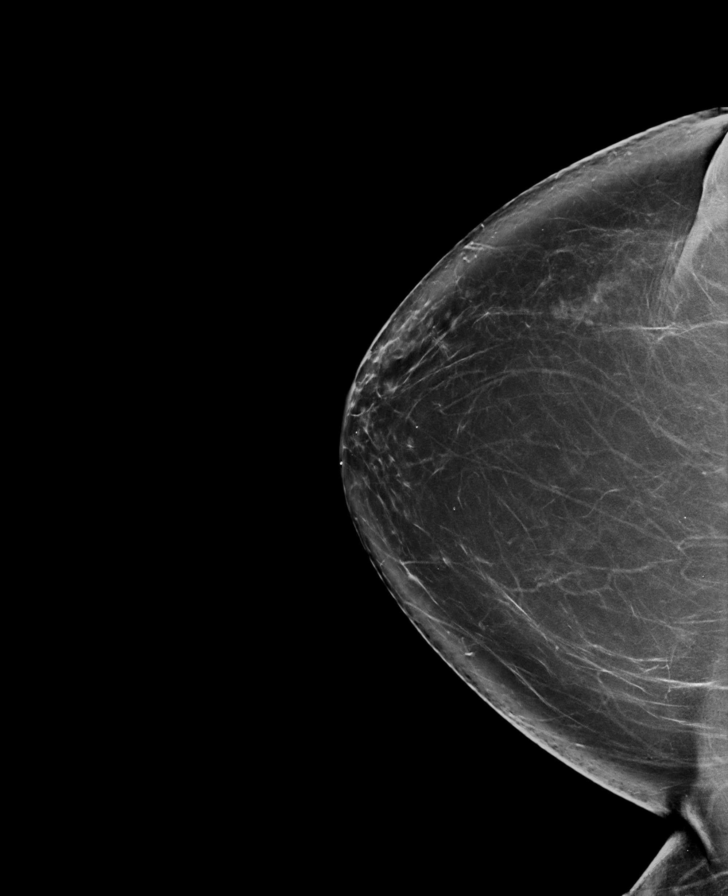

[L CC synth-2D]
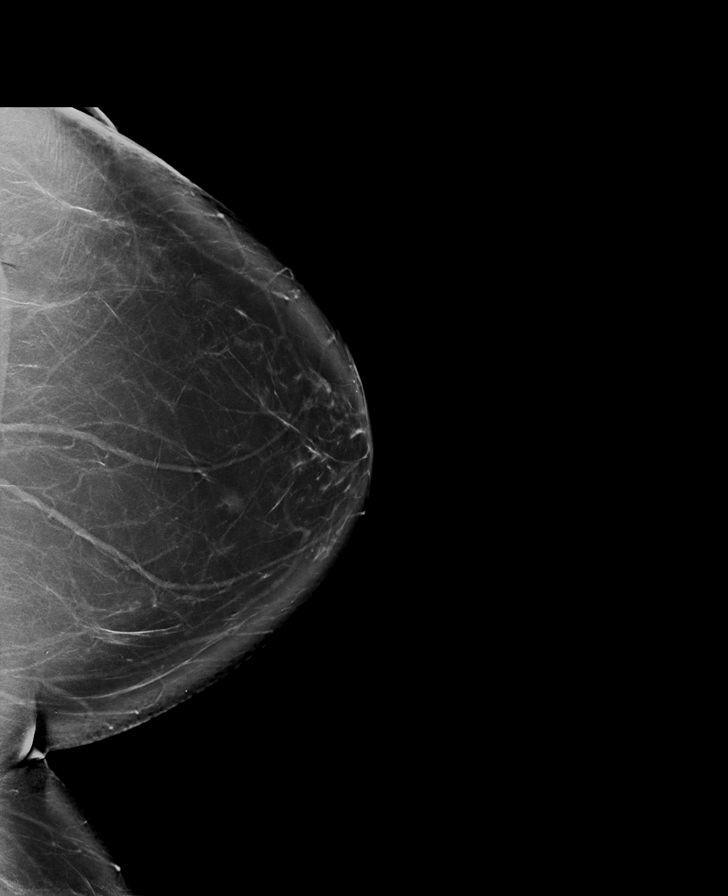

[L MLO synth-2D]
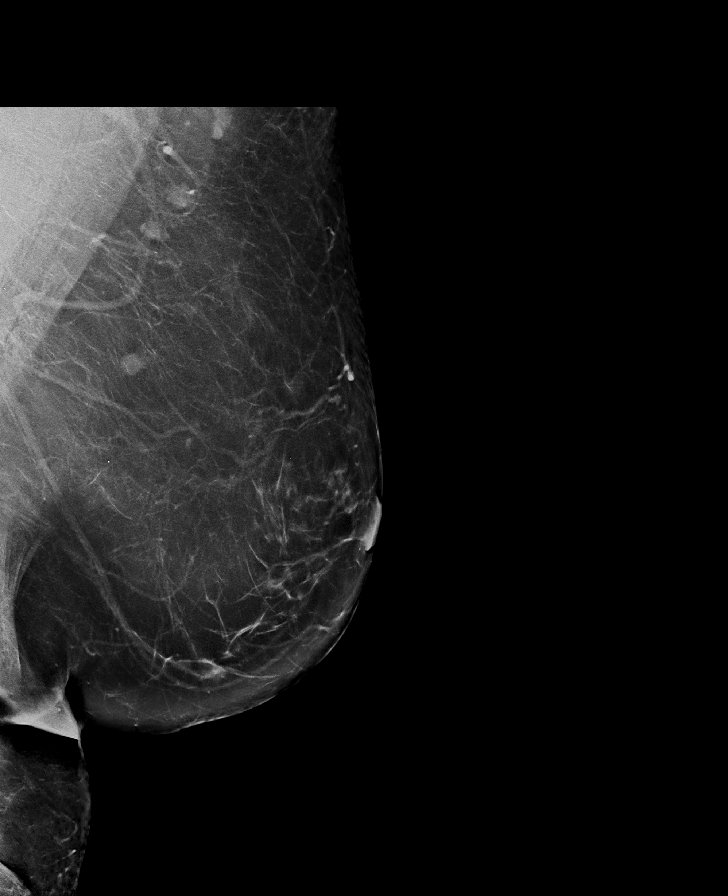

[R CC tomo · tomo slice 45/89.0]
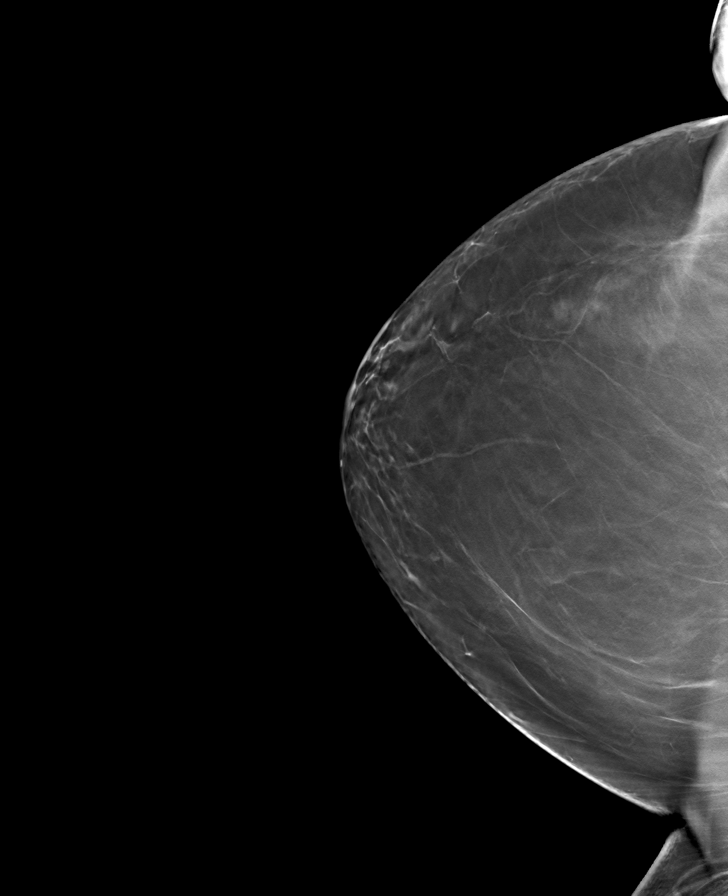

[L CC tomo · tomo slice 50/99.0]
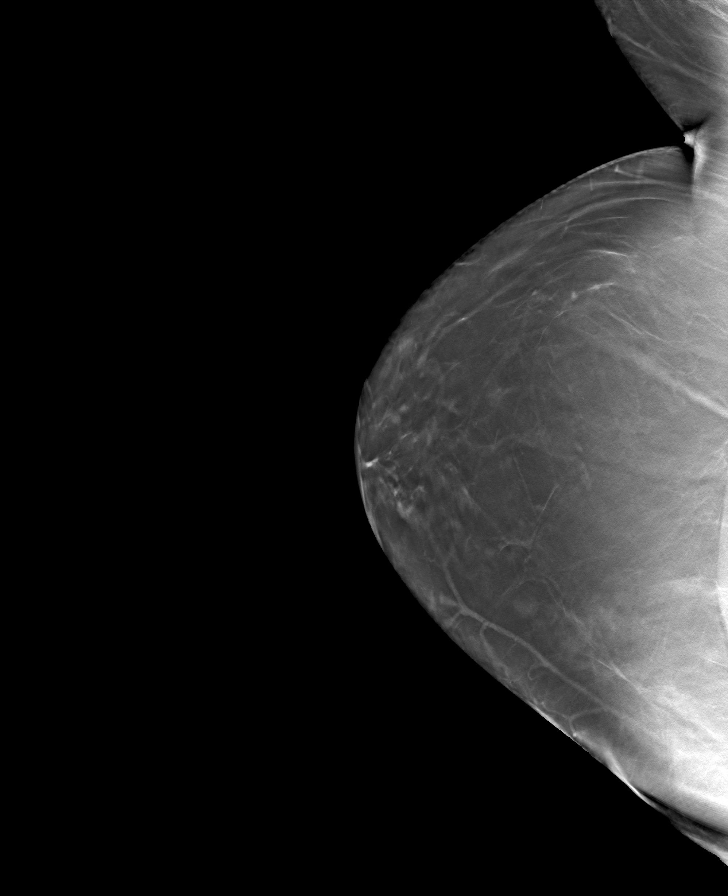

[L MLO tomo · tomo slice 47/94.0]
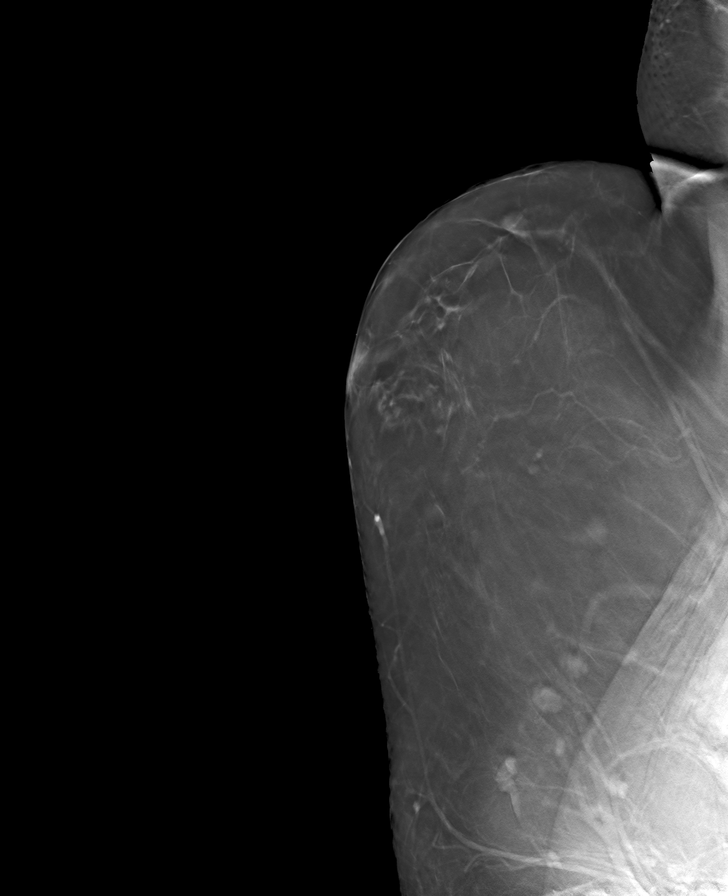

[R MLO tomo · tomo slice 43/86.0]
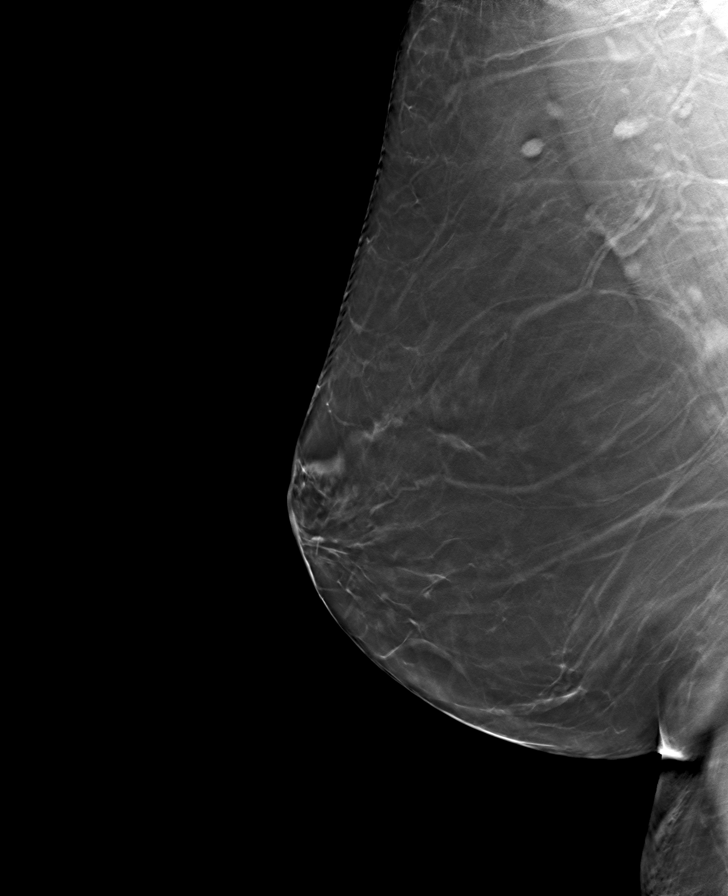

[8 of 24 positions shown; findings below may reference images not displayed]

FINDINGS: There are no findings suspicious for malignancy. Images were
processed with CAD.
IMPRESSION: No mammographic evidence of malignancy. A result letter of this
screening mammogram will be mailed directly to the patient.

RECOMMENDATION:
Screening mammogram in one year. (Code:[id number])

BI-RADS CATEGORY  1: Negative.

## 2018-09-10 ENCOUNTER — Ambulatory Visit (INDEPENDENT_AMBULATORY_CARE_PROVIDER_SITE_OTHER): Payer: BLUE CROSS/BLUE SHIELD

## 2018-09-10 DIAGNOSIS — J4530 Mild persistent asthma, uncomplicated: Secondary | ICD-10-CM

## 2018-09-10 MED ORDER — OMALIZUMAB 150 MG ~~LOC~~ SOLR
150.0000 mg | Freq: Once | SUBCUTANEOUS | Status: AC
  Administered 2018-09-10: 150 mg via SUBCUTANEOUS

## 2018-10-02 DIAGNOSIS — J4540 Moderate persistent asthma, uncomplicated: Secondary | ICD-10-CM | POA: Diagnosis not present

## 2018-10-06 ENCOUNTER — Telehealth: Payer: Self-pay | Admitting: Pulmonary Disease

## 2018-10-06 NOTE — Telephone Encounter (Signed)
Prefilled Syringe: #150mg  1  #75mg  0 Ordered Date: 10/06/18 Shipping Date: 10/09/18 I also called the pt and asked her if we could rsc her appt. Her Xolair will be here Friday just not at 8:45. I rsc her appt for 3:15.

## 2018-10-09 ENCOUNTER — Ambulatory Visit: Payer: BLUE CROSS/BLUE SHIELD

## 2018-10-09 NOTE — Telephone Encounter (Signed)
Called pt to let her know her Xolair wouldn't be here today. Now [facility] Is saying it won't be here til 10/12/2018 or Tues.Marland Kitchen

## 2018-10-12 NOTE — Telephone Encounter (Signed)
Pt had called [facility] B/c she received a weird ph[facility] from [facility] saying she lived [facility] far [facility] for them to ship her Xolair. What, I have not hear that until today. The rep she spoke to said an emp. With the last name Steel signed for it. Pt's Xolair did come in on the 10/09/2018. [employer] delivered it to [facility] the practice beside of us. Their supervisor tried to bring it to us and we had just locked our doors. I met her at the back door and puicked up the pkg. Today. Pt is coming in 10/13/2018 at 3:30 to get her Xolair shot. She is feeling the effects of not getting her injection.  Prefilled Syringes: # 150mg 1  #75mg 0 Arrival Date: 10/12/2018 Lot #: 150mg [id number]      75mg 0 Exp Date: 150mg 03/2019   75mg 0 Nothing further needed. 

## 2018-10-12 NOTE — Telephone Encounter (Signed)
Prefilled Syringe: #150mg  1  #75mg  0 Ordered Date: 10/09/2018 Shipping Date: 10/12/2018  I didn't get a chance to put this in 10/09/2018.

## 2018-10-12 NOTE — Telephone Encounter (Signed)
Patient calling for Tammy S. Regarding her Xolair, CB is [phone].

## 2018-10-13 ENCOUNTER — Ambulatory Visit (INDEPENDENT_AMBULATORY_CARE_PROVIDER_SITE_OTHER): Payer: BLUE CROSS/BLUE SHIELD

## 2018-10-13 DIAGNOSIS — J4530 Mild persistent asthma, uncomplicated: Secondary | ICD-10-CM | POA: Diagnosis not present

## 2018-10-13 MED ORDER — OMALIZUMAB 150 MG ~~LOC~~ SOLR
150.0000 mg | SUBCUTANEOUS | Status: DC
  Administered 2018-10-13: 150 mg via SUBCUTANEOUS

## 2018-10-13 NOTE — Progress Notes (Signed)
Documentation of medication administration and charges of Xolair have been completed by Judianne Seiple, CMA based on the hand written Xolair documentation sheet completed by Tammy Scott, who administered the medication.  

## 2018-11-04 ENCOUNTER — Telehealth: Payer: Self-pay | Admitting: Pulmonary Disease

## 2018-11-04 NOTE — Telephone Encounter (Addendum)
Pt sent me a My chart message. She hadn't heard from the pharm.,so she called to see what was going on. After 45 mins. she ordered her Xolair. I messaged pt back asking her to let me know what she found out, so if I needed to call the pharm. And do something before med could be ordered.  Prefilled Syringe: #150mg  2 Qty should be 1.  #75mg  0 Ordered Date: 11/03/2018  Ordered by pt.. Shipping Date: 11/09/2018

## 2018-11-10 ENCOUNTER — Telehealth: Payer: Self-pay | Admitting: Family Medicine

## 2018-11-10 NOTE — Telephone Encounter (Signed)
Copied from [place] [id number]. Topic: Quick Communication - Rx Refill/Question >> Nov 10, 2018  2:31 PM Alanda Slim E wrote: Medication: omalizumab Arvid Right) injection 150 mg - Medication needs new PA- PA expired 01.23.2020   Preferred Pharmacy (with phone number or street name): [place], [place] [phone] (Phone) [phone] (Fax)

## 2018-11-10 NOTE — Telephone Encounter (Signed)
This is prescribed by pulmonary.

## 2018-11-13 ENCOUNTER — Ambulatory Visit: Payer: BLUE CROSS/BLUE SHIELD

## 2018-11-17 NOTE — Telephone Encounter (Signed)
Please advise on status of patient's Xolair.  I see in her chart where it was ordered on 1/28 but I do not see any documentation of it arriving to clinic.  Thanks!

## 2018-11-19 NOTE — Telephone Encounter (Signed)
CJ, [facility] is calling for an update about the PA for Xolair medication. Requesting a call back with an update. Cb is [phone].

## 2018-11-23 ENCOUNTER — Ambulatory Visit (INDEPENDENT_AMBULATORY_CARE_PROVIDER_SITE_OTHER): Payer: BLUE CROSS/BLUE SHIELD

## 2018-11-23 DIAGNOSIS — J4540 Moderate persistent asthma, uncomplicated: Secondary | ICD-10-CM

## 2018-11-24 MED ORDER — OMALIZUMAB 150 MG ~~LOC~~ SOLR
150.0000 mg | SUBCUTANEOUS | Status: DC
  Administered 2018-11-23: 150 mg via SUBCUTANEOUS

## 2018-11-24 NOTE — Progress Notes (Signed)
Xolair injection documentation and charges entered by Shakisha Abend, RMA, based on injection sheet filled out by Tammy Scott during preparation and administration. This documentation process is due to office requirements.   

## 2018-11-25 ENCOUNTER — Telehealth: Payer: Self-pay | Admitting: Pulmonary Disease

## 2018-11-25 NOTE — Telephone Encounter (Signed)
Xolair 150 mg PFS given subQ every 4 wks., Provider: Loanne Drilling.  Called in script asked for 11 refills. Nothing further needed.

## 2018-12-01 NOTE — Telephone Encounter (Signed)
[facility] not [facility] pt's ins.. ([place]) [facility] is doing the continuation P/A. It's been approved, their still verifying pt's ins.. [facility] has been verifying ins. Since 11/03/2018. Rep sent another e-mail to expedite process. Rep also said to call back this afternoon.

## 2018-12-03 ENCOUNTER — Ambulatory Visit: Payer: BLUE CROSS/BLUE SHIELD | Admitting: Family Medicine

## 2018-12-03 ENCOUNTER — Telehealth: Payer: Self-pay

## 2018-12-03 ENCOUNTER — Encounter: Payer: Self-pay | Admitting: Family Medicine

## 2018-12-03 VITALS — BP 122/80 | HR 87 | Temp 99.3°F | Resp 16 | Ht 65.0 in | Wt 183.0 lb

## 2018-12-03 DIAGNOSIS — R05 Cough: Secondary | ICD-10-CM

## 2018-12-03 DIAGNOSIS — R059 Cough, unspecified: Secondary | ICD-10-CM

## 2018-12-03 MED ORDER — DOXYCYCLINE HYCLATE 100 MG PO TABS: 100.0000 mg | ORAL_TABLET | Freq: Two times a day (BID) | ORAL | 0 refills | Status: DC

## 2018-12-03 MED ORDER — HYDROCODONE-HOMATROPINE 5-1.5 MG/5ML PO SYRP: 5.0000 mL | ORAL_SOLUTION | Freq: Three times a day (TID) | ORAL | 0 refills | Status: DC | PRN

## 2018-12-03 NOTE — Patient Instructions (Signed)
We will treat you with doxycycline antibiotic twice a day for 10 days Also hycodan cough syrup as needed; this will cause drowsiness, do not use when you need to drive Please let us know if you are not getting better in the next few days- Sooner if worse.

## 2018-12-03 NOTE — Telephone Encounter (Signed)
Appt scheduled w/ PCP 12/03/2018.

## 2018-12-03 NOTE — Telephone Encounter (Signed)
Copied from [place] [id number]. Topic: General - Other >> Dec 03, 2018  9:12 AM Oneta Rack wrote: Relation to pt: self  Call back number: [phone] [facility] [address] [phone] (Phone) [phone] (Fax)  Reason for call:  Patient experiencing coughing, chills, stuffy nose 2x days, patient PCP has no availability, offered another physician patient declined. Patient requesting Rx (No ZPack), please advise

## 2018-12-03 NOTE — Progress Notes (Signed)
[place] at [facility] [place], [place], [place] [address] [phone]  Date:  12/03/2018   Name:  Amanda [name]   DOB:  1956/01/19   MRN:  [id number]  PCP:  Darreld Mclean, MD    Chief Complaint: URI (headache, cough, non productive mostly, sore throat, congestion, no fever)   History of Present Illness:  Amanda [name] is a 63 y.o. very pleasant female patient who presents with the following:  Here today for a sick visit- started yesterday with ST She then noted sx of a cold- chills yesterday but none today She felt feverish yesterday but not today Did not check her temp  She notes that she has had 3 colds since October  History of mild asthma  No vomiting or diarrhea Her appetite is not great but no anorexia either   She is on adviar, singulair, xolair injection She is using albuterol daily now since she has been sick   She is upset that she had to come in today- "my last doctor would just call something in for me"   Patient Active Problem List   Diagnosis Date Noted  . Chronic venous insufficiency 07/25/2016  . Pain of left leg 03/19/2016  . LPRD (laryngopharyngeal reflux disease) 01/16/2016  . Vitamin D deficiency 06/22/2009  . Backache 09/05/2008  . Overweight 04/13/2008  . Anxiety state 04/13/2008  . Allergic rhinitis 04/13/2008  . Diaphragmatic hernia 04/13/2008  . Osteoarthritis 04/13/2008  . Hypothyroidism 04/12/2008  . Hyperlipidemia 04/12/2008  . Asthma 04/12/2008    Past Medical History:  Diagnosis Date  . Allergic rhinitis   . Asthma   . Gastroesophageal reflux disease with hiatal hernia   . History of shingles   . Hyperlipidemia   . Hypothyroidism   . Osteoarthritis   . Overweight(278.02)   . Vitamin D deficiency     Past Surgical History:  Procedure Laterality Date  . breast reductions  2000   in [place]  . left knee arthroscopy  1999   by Dr. Eddie Dibbles  .  REDUCTION MAMMAPLASTY      Social History   Tobacco Use  . Smoking status: Never Smoker  . Smokeless tobacco: Never Used  Substance Use Topics  . Alcohol use: Yes    Comment: social use  . Drug use: No    No family history on file.  Allergies  Allergen Reactions  . Azithromycin     zpak does not work for the pt    Medication list has been reviewed and updated.  Current Outpatient Medications on File Prior to Visit  Medication Sig Dispense Refill  . aspirin 81 MG tablet Take 81 mg by mouth daily.     . cetirizine (ZYRTEC) 10 MG tablet Take 10 mg by mouth daily as needed.    . cholecalciferol (VITAMIN D) 1000 units tablet Take 2,000 Units by mouth daily.     . Fluticasone-Salmeterol (ADVAIR DISKUS) 500-50 MCG/DOSE AEPB INHALE ONE PUFF BY MOUTH EVERY TWELVE HOURS 60 each 9  . lisinopril (PRINIVIL,ZESTRIL) 5 MG tablet Take 5 mg by mouth daily.    . montelukast (SINGULAIR) 10 MG tablet Take 1 tablet (10 mg total) by mouth at bedtime. 30 tablet 9  . pantoprazole (PROTONIX) 40 MG tablet TAKE 1 TABLET (40 MG TOTAL) BY MOUTH 2 (TWO) TIMES DAILY. (Patient taking differently: 40 mg daily. ) 60 tablet 9  . predniSONE (DELTASONE) 20 MG tablet 1 tab  twicedailyx3days,1/2tabx3 days,1/2tabeveryotherdaytilgone 12 tablet 0  . ranitidine (ZANTAC) 75 MG tablet Take 1 tablet (75 mg total) by mouth at bedtime as needed. (Patient taking differently: Take 75 mg by mouth at bedtime as needed. As needed) 30 tablet 11  . simvastatin (ZOCOR) 40 MG tablet Take 1 tablet (40 mg total) by mouth at bedtime. 30 tablet 9  . thyroid (ARMOUR THYROID) 60 MG tablet Take 1 tablet (60 mg total) by mouth daily. 30 tablet 9  . VENTOLIN HFA 108 (90 Base) MCG/ACT inhaler INHALE 2 PUFFS INTO THE LUNGS EVERY 6 (SIX) HOURS AS NEEDED FOR WHEEZING. 18 Inhaler 11  . XOLAIR 150 MG/ML SOSY INJECT 1 SYRINGE UNDER THE SKIN EVERY 4 WEEKS 1 Syringe 5   Current Facility-Administered Medications on File Prior to Visit  Medication Dose  Route Frequency Provider Last Rate Last Dose  . omalizumab Arvid Right) injection 150 mg  150 mg Subcutaneous Q14 Days Margaretha Seeds, MD   150 mg at 10/13/18 1603  . omalizumab Arvid Right) injection 150 mg  150 mg Subcutaneous Q14 Days Margaretha Seeds, MD   150 mg at 11/23/18 1646    Review of Systems:  As per HPI- otherwise negative. No vomiting or diarrhea   Physical Examination: Vitals:   12/03/18 1439  BP: 122/80  Pulse: 87  Resp: 16  Temp: 99.3 F (37.4 C)  SpO2: 98%   Vitals:   12/03/18 1439  Weight: 183 lb (83 kg)  Height: 5\' 5"  (1.651 m)   Body mass index is 30.45 kg/m. Ideal Body Weight: Weight in (lb) to have BMI = 25: 149.9  GEN: WDWN, NAD, Non-toxic, A & O x 3, obese, looks well    HEENT: Atraumatic, Normocephalic. Neck supple. No masses, No LAD.  Bilateral TM wnl, oropharynx normal.  PEERL,EOMI.   Ears and Nose: No external deformity. CV: RRR, No M/G/R. No JVD. No thrill. No extra heart sounds. PULM: CTA B, no wheezes, crackles, rhonchi. No retractions. No resp. distress. No accessory muscle use. EXTR: No c/c/e NEURO Normal gait.  PSYCH: Normally interactive. Conversant. Not depressed or anxious appearing.  Calm demeanor.   Refused a flu test  Assessment and Plan: Cough - Plan: doxycycline (VIBRA-TABS) 100 MG tablet, HYDROcodone-homatropine (HYCODAN) 5-1.5 MG/5ML syrup, DISCONTINUED: HYDROcodone-homatropine (HYCODAN) 5-1.5 MG/5ML syrup  Here today with URI sx.  Pt is insistent that she needs abx and declines to let me test her for the flu rx for doxycycline given Also rx for hycodan, advised that this can cause sedation  Meds ordered this encounter  Medications  . doxycycline (VIBRA-TABS) 100 MG tablet    Sig: Take 1 tablet (100 mg total) by mouth 2 (two) times daily.    Dispense:  20 tablet    Refill:  0  . DISCONTD: HYDROcodone-homatropine (HYCODAN) 5-1.5 MG/5ML syrup    Sig: Take 5 mLs by mouth every 8 (eight) hours as needed for cough.     Dispense:  60 mL    Refill:  0  . HYDROcodone-homatropine (HYCODAN) 5-1.5 MG/5ML syrup    Sig: Take 5 mLs by mouth every 8 (eight) hours as needed for cough.    Dispense:  60 mL    Refill:  0     Signed Lamar Blinks, MD

## 2018-12-03 NOTE — Telephone Encounter (Signed)
Patient coming in for appt today with pcp.

## 2018-12-10 ENCOUNTER — Telehealth: Payer: Self-pay | Admitting: Pulmonary Disease

## 2018-12-10 NOTE — Telephone Encounter (Signed)
Prefilled Syringe: #150mg  1  #75mg  0 Ordered Date: 12/10/2018 Shipping Date: 12/16/2018   [facility] Rep is checking now. Now rep is calling [employer] PA dept., to find out if [facility] is her spec[facility] Pharm. Now. Ins. Co. Is closed, their going to call them back 12/11/2018.

## 2018-12-10 NOTE — Telephone Encounter (Signed)
I called the number given and held on the line for over 8 mins. Will try again later.

## 2018-12-11 ENCOUNTER — Telehealth: Payer: Self-pay | Admitting: Pulmonary Disease

## 2018-12-11 NOTE — Telephone Encounter (Signed)
[facility] stating there is a conflict with insurance and pharmacy have rights to dispense. Otila Kluver from pharmacy states she has been contacting [employer].   I will route to injection pull for follow up on this patient. Otila Kluver at Specialty pharmacy is [phone] ext [phone]

## 2018-12-11 NOTE — Telephone Encounter (Signed)
Tammy please advise if we can close encounter.

## 2018-12-15 NOTE — Telephone Encounter (Signed)
Encounter will be left open.

## 2018-12-15 NOTE — Telephone Encounter (Signed)
No, waiting for Xolair to come in so I can put it in [place] text. Leave Encounter Open Until Xolair Comes In.

## 2018-12-17 NOTE — Telephone Encounter (Signed)
message routed to [facility] injection, to follow up

## 2018-12-17 NOTE — Telephone Encounter (Signed)
Please advise if there is an update on this for pt. Thanks!

## 2018-12-18 NOTE — Telephone Encounter (Signed)
Please see telephone encounter 12/11/2018.

## 2018-12-18 NOTE — Telephone Encounter (Addendum)
Called [employer] and held for 20 min  Spoke with rep to alter the PA to state [facility] will be dispensing pt's Xolair  She was experiencing some technical difficulties and states will call back after she gets her computer back up  She mentioned that a new PA may be needed  Reference number [id number] Pt ID number [id number]

## 2018-12-18 NOTE — Telephone Encounter (Signed)
See encounter dated 12/11/2018

## 2018-12-18 NOTE — Telephone Encounter (Signed)
Called [facility] and spoke with Tim. He states that they are not the dispensing pharmacy for the pt's [place].  Ama at [phone]. They state that we need to contact the pt's insurance company and alter the pt's PA to reflect that they will be the dispensing pharmacy for [place].  Contacted [employer] at [phone]. Was on a 25+ min hold with no one coming to the line. I was prompted that I could leave a message but once the voicemail picked up I was told that the voicemail was full. Will try back.

## 2018-12-23 DIAGNOSIS — J4550 Severe persistent asthma, uncomplicated: Secondary | ICD-10-CM | POA: Diagnosis not present

## 2018-12-23 NOTE — Telephone Encounter (Signed)
Received the following message from the pt via MyChart:  just an FYI...Marland Kitchenreceived a phone call from [facility] worker) saying they are calling [facility] to schedule the delivery of the Xolair. i do not currently have an appointment to have this injection. when [facility] has contacted you and given a specific delivery date.Marland KitchenMarland KitchenMarland KitchenMarland Kitchenplease email/call me and i will set up the appointment. Thank you. Amanda [name] [phone].  Tammy - please contact [place] to set up this shipment. Thanks.

## 2018-12-23 NOTE — Telephone Encounter (Signed)
Amanda [name],  Patient sent this message for you.  Sounds good. whatever you have available around 8:45 to 9:15 am would be helpful to me.  thanks so much.  [name]

## 2018-12-23 NOTE — Telephone Encounter (Addendum)
Sent pt a my chart message. Pt's Xolair has been ordered and will be in our office 12/24/2018 (12/25/2018 Fri. Not Thurs.Marland Kitchen) for pt's appt. which is her next inj. Date. (12/28/2018) Will call pt. To set up her appt..  Prefilled Syringe: #150mg  1  #75mg  0 Ordered Date: 12/23/2018 Shipping Date: 12/24/2018  Prefilled Syringes: # 150mg  1  #75mg  0 Arrival Date: 12/25/2018 Lot #: [id number]  [id number]      75mg  0 Exp Date: 150mg  11/2019   75mg  0

## 2018-12-25 NOTE — Telephone Encounter (Signed)
PA was approved. Xolair was ordered and delivered. Pt's appt. Has been made. Nothing further needed.

## 2018-12-25 NOTE — Telephone Encounter (Signed)
Called pt and made her appt. For Mon. At 9:15. Nothing further needed.

## 2018-12-25 NOTE — Telephone Encounter (Signed)
Pt has been able to get a Xolair injection appt scheduled Monday, 3/23. Nothing further needed.

## 2018-12-25 NOTE — Telephone Encounter (Signed)
Pt. Has an appt. For Mon.. Nothing further needed.

## 2018-12-28 ENCOUNTER — Ambulatory Visit (INDEPENDENT_AMBULATORY_CARE_PROVIDER_SITE_OTHER): Payer: BLUE CROSS/BLUE SHIELD

## 2018-12-28 ENCOUNTER — Other Ambulatory Visit: Payer: Self-pay

## 2018-12-28 DIAGNOSIS — J4540 Moderate persistent asthma, uncomplicated: Secondary | ICD-10-CM

## 2018-12-28 MED ORDER — OMALIZUMAB 150 MG ~~LOC~~ SOLR
150.0000 mg | Freq: Once | SUBCUTANEOUS | Status: AC
  Administered 2018-12-28: 150 mg via SUBCUTANEOUS

## 2018-12-29 MED ORDER — OMALIZUMAB 150 MG ~~LOC~~ SOLR: 150.0000 mg | Freq: Once | SUBCUTANEOUS | Status: DC

## 2018-12-29 NOTE — Progress Notes (Signed)
I put the charge in yesterday. When I went to wrap up it only had a charge for the inj.. So I entered the charge again today, the charge was completed with med.. There was an X by the 2nd charge, I clicked on it and it prompted me to put in ordering provider, then discontinued came up. That should do it. If not let me know. It didn't d/c xolair for next time? I hope not anyway.

## 2018-12-31 ENCOUNTER — Encounter: Payer: Self-pay | Admitting: Family Medicine

## 2019-01-05 NOTE — Progress Notes (Signed)
[place] at [facility] [address] [place], [place] [address] [phone]  Date:  01/06/2019   Name:  Amanda [name]   DOB:  [birth year]   MRN:  [id number]  PCP:  Darreld Mclean, MD    Chief Complaint: No chief complaint on file.   History of Present Illness:  Amanda [name] is a 63 y.o. very pleasant female patient who presents with the following:   Virtual visit today due to current COVID-19 outbreak Patient be confirmed by name, date of birth, and personal parents Amanda [name] is here for 35-month follow-up She has history of hyperlipidemia, osteoarthritis, hypothyroidism, reflux, anxiety, asthma Her pulmonologist is Dr. Loanne Drilling  I last saw her in the office towards the end of February, for sick visit We treated her with a course of doxycycline at that time She notes that she continues to have a dry cough but it otherwise feeling well She is not having much wheezing  She is not going to see pulmonology until July-her appointment got moved back due to current crisis She is still going in to get her monthly xolair injection  No fever She has felt well in general She has been stressed due to the current situation but feels that she is doing okay She does have some aches and pains in general, which she attributes to her age She is working from home and also going in to her office   Aspirin 81 Zyrtec Vitamin D OTC Advair inhaler Albuterol as needed Lisinopril 5 Singular 10 Protonix Zantac as needed Simvastatin 40 Armour Thyroid 60 xolair injections per pulmonology  Lab Results  Component Value Date   TSH 2.42 08/06/2018   Last labs were drawn in October.  Can schedule for a lab visit if she would like- she prefers to wait until October.  She has typically had her labs drawn just annually  Patient Active Problem List   Diagnosis Date Noted  . Chronic venous insufficiency 07/25/2016  . Pain of left leg  03/19/2016  . LPRD (laryngopharyngeal reflux disease) 01/16/2016  . Vitamin D deficiency 06/22/2009  . Backache 09/05/2008  . Overweight 04/13/2008  . Anxiety state 04/13/2008  . Allergic rhinitis 04/13/2008  . Diaphragmatic hernia 04/13/2008  . Osteoarthritis 04/13/2008  . Hypothyroidism 04/12/2008  . Hyperlipidemia 04/12/2008  . Asthma 04/12/2008    Past Medical History:  Diagnosis Date  . Allergic rhinitis   . Asthma   . Gastroesophageal reflux disease with hiatal hernia   . History of shingles   . Hyperlipidemia   . Hypothyroidism   . Osteoarthritis   . Overweight(278.02)   . Vitamin D deficiency     Past Surgical History:  Procedure Laterality Date  . breast reductions  2000   in [place]  . left knee arthroscopy  1999   by Dr. Eddie Dibbles  . REDUCTION MAMMAPLASTY      Social History   Tobacco Use  . Smoking status: Never Smoker  . Smokeless tobacco: Never Used  Substance Use Topics  . Alcohol use: Yes    Comment: social use  . Drug use: No    No family history on file.  Allergies  Allergen Reactions  . Azithromycin     zpak does not work for the pt    Medication list has been reviewed and updated.  Current Outpatient Medications on File Prior to Visit  Medication Sig Dispense Refill  . aspirin 81 MG tablet Take  81 mg by mouth daily.     . cetirizine (ZYRTEC) 10 MG tablet Take 10 mg by mouth daily as needed.    . cholecalciferol (VITAMIN D) 1000 units tablet Take 2,000 Units by mouth daily.     Marland Kitchen doxycycline (VIBRA-TABS) 100 MG tablet Take 1 tablet (100 mg total) by mouth 2 (two) times daily. 20 tablet 0  . Fluticasone-Salmeterol (ADVAIR DISKUS) 500-50 MCG/DOSE AEPB INHALE ONE PUFF BY MOUTH EVERY TWELVE HOURS 60 each 9  . HYDROcodone-homatropine (HYCODAN) 5-1.5 MG/5ML syrup Take 5 mLs by mouth every 8 (eight) hours as needed for cough. 60 mL 0  . lisinopril (PRINIVIL,ZESTRIL) 5 MG tablet Take 5 mg by mouth daily.    . montelukast (SINGULAIR) 10 MG  tablet Take 1 tablet (10 mg total) by mouth at bedtime. 30 tablet 9  . pantoprazole (PROTONIX) 40 MG tablet TAKE 1 TABLET (40 MG TOTAL) BY MOUTH 2 (TWO) TIMES DAILY. (Patient taking differently: 40 mg daily. ) 60 tablet 9  . ranitidine (ZANTAC) 75 MG tablet Take 1 tablet (75 mg total) by mouth at bedtime as needed. (Patient taking differently: Take 75 mg by mouth at bedtime as needed. As needed) 30 tablet 11  . simvastatin (ZOCOR) 40 MG tablet Take 1 tablet (40 mg total) by mouth at bedtime. 30 tablet 9  . thyroid (ARMOUR THYROID) 60 MG tablet Take 1 tablet (60 mg total) by mouth daily. 30 tablet 9  . VENTOLIN HFA 108 (90 Base) MCG/ACT inhaler INHALE 2 PUFFS INTO THE LUNGS EVERY 6 (SIX) HOURS AS NEEDED FOR WHEEZING. 18 Inhaler 11  . XOLAIR 150 MG/ML SOSY INJECT 1 SYRINGE UNDER THE SKIN EVERY 4 WEEKS 1 Syringe 5   Current Facility-Administered Medications on File Prior to Visit  Medication Dose Route Frequency Provider Last Rate Last Dose  . omalizumab Arvid Right) injection 150 mg  150 mg Subcutaneous Q14 Days Margaretha Seeds, MD   150 mg at 10/13/18 1603  . omalizumab Arvid Right) injection 150 mg  150 mg Subcutaneous Q14 Days Margaretha Seeds, MD   150 mg at 11/23/18 1646    Review of Systems:  As per HPI- otherwise negative. No fever chills, no unusual shortness of breath  Physical Examination: There were no vitals filed for this visit. There were no vitals filed for this visit. There is no height or weight on file to calculate BMI. Ideal Body Weight:     She is not checking her BP at home   Patient appears well on video.  No tachypnea or distress noted.  No rash   Assessment and Plan: Mild persistent asthma without complication  Mild hypertension  Hypothyroidism, unspecified type  Following up today via video visit.  [name] is currently doing well.  She continues to see pulmonology and get Xolair injections for her asthma.  She notes that her symptoms are under control  currently. She is not checking her blood pressure, last her to start doing so Plan to get her labs and check thyroid in October Message to patient via [place], with visit summary  Signed Lamar Blinks, MD

## 2019-01-06 ENCOUNTER — Encounter: Payer: Self-pay | Admitting: Family Medicine

## 2019-01-06 ENCOUNTER — Ambulatory Visit (INDEPENDENT_AMBULATORY_CARE_PROVIDER_SITE_OTHER): Payer: BLUE CROSS/BLUE SHIELD | Admitting: Family Medicine

## 2019-01-06 ENCOUNTER — Other Ambulatory Visit: Payer: Self-pay

## 2019-01-06 DIAGNOSIS — J4530 Mild persistent asthma, uncomplicated: Secondary | ICD-10-CM

## 2019-01-06 DIAGNOSIS — I10 Essential (primary) hypertension: Secondary | ICD-10-CM | POA: Diagnosis not present

## 2019-01-06 DIAGNOSIS — E039 Hypothyroidism, unspecified: Secondary | ICD-10-CM

## 2019-01-06 NOTE — Patient Instructions (Addendum)
It was great to talk to you today, please stay safe Let us plan to visit in October as per usual routine.  We can do complete labs/physical exam at that time If you are able, please do monitor your blood pressure at home a couple times a week. Blood pressure goal is less than 135/85, on average Please let me know if I can help you during this difficult time

## 2019-01-21 ENCOUNTER — Telehealth: Payer: Self-pay | Admitting: Pulmonary Disease

## 2019-01-21 DIAGNOSIS — J4550 Severe persistent asthma, uncomplicated: Secondary | ICD-10-CM | POA: Diagnosis not present

## 2019-01-21 NOTE — Telephone Encounter (Signed)
Prefilled Syringe Xolair Order: 150mg  Prefilled Syringe:  #1 75mg  Prefilled Syringe: N/A Ordered Date: 01/21/2019 Expected shipping date: 01/26/2019 Ordered by: Desmond Dike, [place]

## 2019-01-26 NOTE — Telephone Encounter (Signed)
Xolair Prefilled Syringe Received:  150mg  Prefilled Syringe >> quantity 1, lot # [id number], exp date 11/2019 75mg  Prefilled Syringe >> N/A Medication arrival date: 01/26/2019 Received by: Desmond Dike, [place]

## 2019-01-29 ENCOUNTER — Ambulatory Visit (INDEPENDENT_AMBULATORY_CARE_PROVIDER_SITE_OTHER): Payer: BLUE CROSS/BLUE SHIELD

## 2019-01-29 ENCOUNTER — Other Ambulatory Visit: Payer: Self-pay

## 2019-01-29 DIAGNOSIS — J4540 Moderate persistent asthma, uncomplicated: Secondary | ICD-10-CM | POA: Diagnosis not present

## 2019-01-29 MED ORDER — OMALIZUMAB 150 MG/ML ~~LOC~~ SOSY
150.0000 mg | PREFILLED_SYRINGE | Freq: Once | SUBCUTANEOUS | Status: AC
  Administered 2019-01-29: 10:00:00 150 mg via SUBCUTANEOUS

## 2019-01-29 NOTE — Progress Notes (Signed)
Have you been hospitalized within the last 10 days?  No Do you have a fever?  No Do you have a cough?  Yes dry allergy cough Do you have a headache or sore throat? No

## 2019-02-03 ENCOUNTER — Ambulatory Visit: Payer: BLUE CROSS/BLUE SHIELD | Admitting: Pulmonary Disease

## 2019-02-09 ENCOUNTER — Other Ambulatory Visit: Payer: Self-pay | Admitting: Pulmonary Disease

## 2019-02-22 ENCOUNTER — Telehealth: Payer: Self-pay | Admitting: Pulmonary Disease

## 2019-02-22 NOTE — Telephone Encounter (Addendum)
Xolair Prefilled Syringe Order: 150mg  Prefilled Syringe:  #1 75mg  Prefilled Syringe: N/A Ordered Date: 02/22/2019 Expected date of arrival: TBD Ordered by: Desmond Dike, [place]: [facility]   When speaking with [facility] with [facility], I was advised that the pharmacy is needing to verify coverage with the pt's insurance. Once this is handled they will contact us back to schedule the shipment.

## 2019-02-23 DIAGNOSIS — J4550 Severe persistent asthma, uncomplicated: Secondary | ICD-10-CM | POA: Diagnosis not present

## 2019-02-25 NOTE — Telephone Encounter (Signed)
I called [facility] to check the status of pt's Xolair. Med will be coming in 02/26/2019. Pt. appt is 12/02/2018. Will wait for xolair to come in.

## 2019-02-26 NOTE — Telephone Encounter (Signed)
Xolair Prefilled Syringe Received:  150mg  Prefilled Syringe >> quantity 1, lot # [id number], exp date 11/2019 Medication arrival date: 02/26/2019 Received by: [facility]

## 2019-03-02 ENCOUNTER — Other Ambulatory Visit: Payer: Self-pay

## 2019-03-02 ENCOUNTER — Ambulatory Visit (INDEPENDENT_AMBULATORY_CARE_PROVIDER_SITE_OTHER): Payer: BLUE CROSS/BLUE SHIELD

## 2019-03-02 DIAGNOSIS — J4540 Moderate persistent asthma, uncomplicated: Secondary | ICD-10-CM

## 2019-03-02 MED ORDER — OMALIZUMAB 150 MG/ML ~~LOC~~ SOSY
150.0000 mg | PREFILLED_SYRINGE | Freq: Once | SUBCUTANEOUS | Status: AC
  Administered 2019-03-02: 10:00:00 150 mg via SUBCUTANEOUS

## 2019-03-02 NOTE — Progress Notes (Signed)
Have you been hospitalized within the last 10 days?  No Do you have a fever?  No Do you have a cough?  No Do you have a headache or sore throat? No  

## 2019-03-22 ENCOUNTER — Telehealth: Payer: Self-pay | Admitting: Pulmonary Disease

## 2019-03-22 DIAGNOSIS — J4550 Severe persistent asthma, uncomplicated: Secondary | ICD-10-CM | POA: Diagnosis not present

## 2019-03-22 NOTE — Telephone Encounter (Signed)
Xolair Prefilled Syringe Order: 150mg  Prefilled Syringe:  #1 75mg  Prefilled Syringe: N/A Ordered Date: 03/22/2019 Expected date of arrival: 03/23/2019 Ordered by: Desmond Dike, [place] Pharmacy: [facility]

## 2019-03-23 NOTE — Telephone Encounter (Signed)
Xolair Prefilled Syringe Received:  150mg  Prefilled Syringe >> quantity 1, lot # [id number], exp date 12/2019 75mg  Prefilled Syringe >> N/A Medication arrival date: 03/23/2019 Received by: Desmond Dike, [place]

## 2019-03-29 ENCOUNTER — Encounter: Payer: Self-pay | Admitting: Family Medicine

## 2019-03-30 ENCOUNTER — Ambulatory Visit (INDEPENDENT_AMBULATORY_CARE_PROVIDER_SITE_OTHER): Payer: BC Managed Care – PPO

## 2019-03-30 ENCOUNTER — Other Ambulatory Visit: Payer: Self-pay

## 2019-03-30 DIAGNOSIS — J4540 Moderate persistent asthma, uncomplicated: Secondary | ICD-10-CM | POA: Diagnosis not present

## 2019-03-30 MED ORDER — OMALIZUMAB 150 MG/ML ~~LOC~~ SOSY
150.0000 mg | PREFILLED_SYRINGE | Freq: Once | SUBCUTANEOUS | Status: AC
  Administered 2019-03-30: 150 mg via SUBCUTANEOUS

## 2019-03-30 NOTE — Progress Notes (Signed)
Have you been hospitalized within the last 10 days?  No Do you have a fever?  No Do you have a cough?  Yes Allergy cough Do you have a headache or sore throat? No

## 2019-03-30 NOTE — Progress Notes (Signed)
[facility] [address] [place], [place] [address] [phone]  Date:  03/31/2019   Name:  Amanda [name]   DOB:  1956-08-03   MRN:  [id number]  PCP:  Darreld Mclean, MD    Chief Complaint: Insect Bite (noticied a week ago,red ring right under arm, no fever or body aches, not itching)   History of Present Illness:  Amanda [name] is a 63 y.o. very pleasant female patient who presents with the following:  Amanda [name] is here today with concern of a possible tick bite on her right chest She has history of hyperlipidemia, osteoarthritis, hypothyroidism, reflux, anxiety, asthma. She is under pulmonology care with Dr. Rodman Pickle  She has noted a red spot on her right chest for about 1 week.  It is not itchy or painful.  She has not pulled off any ticks.  No other skin lesions, otherwise feeling well.  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   Most recent labs in October, can certainly do labs today if she would like-she declines any labs today  Patient Active Problem List   Diagnosis Date Noted  . Chronic venous insufficiency 07/25/2016  . Pain of left leg 03/19/2016  . LPRD (laryngopharyngeal reflux disease) 01/16/2016  . Vitamin D deficiency 06/22/2009  . Backache 09/05/2008  . Overweight 04/13/2008  . Anxiety state 04/13/2008  . Allergic rhinitis 04/13/2008  . Diaphragmatic hernia 04/13/2008  . Osteoarthritis 04/13/2008  . Hypothyroidism 04/12/2008  . Hyperlipidemia 04/12/2008  . Asthma 04/12/2008    Past Medical History:  Diagnosis Date  . Allergic rhinitis   . Asthma   . Gastroesophageal reflux disease with hiatal hernia   . History of shingles   . Hyperlipidemia   . Hypothyroidism   . Osteoarthritis   . Overweight(278.02)   . Vitamin D deficiency     Past Surgical History:  Procedure Laterality Date  . breast reductions  2000   in [place]   . left knee arthroscopy  1999   by Dr. Eddie Dibbles  . REDUCTION MAMMAPLASTY      Social History   Tobacco Use  . Smoking status: Never Smoker  . Smokeless tobacco: Never Used  Substance Use Topics  . Alcohol use: Yes    Comment: social use  . Drug use: No    No family history on file.  Allergies  Allergen Reactions  . Azithromycin     zpak does not work for the pt    Medication list has been reviewed and updated.  Current Outpatient Medications on File Prior to Visit  Medication Sig Dispense Refill  . aspirin 81 MG tablet Take 81 mg by mouth daily.     . cetirizine (ZYRTEC) 10 MG tablet Take 10 mg by mouth daily as needed.    . cholecalciferol (VITAMIN D) 1000 units tablet Take 2,000 Units by mouth daily.     . Fluticasone-Salmeterol (ADVAIR DISKUS) 500-50 MCG/DOSE AEPB INHALE ONE PUFF BY MOUTH EVERY TWELVE HOURS 60 each 9  . HYDROcodone-homatropine (HYCODAN) 5-1.5 MG/5ML syrup Take 5 mLs by mouth every 8 (eight) hours as needed for cough. 60 mL 0  . lisinopril (ZESTRIL) 5 MG tablet TAKE 1 TABLET BY MOUTH EVERY DAY 90 tablet 0  . montelukast (SINGULAIR) 10 MG tablet Take 1 tablet (10 mg total) by mouth at bedtime. 30 tablet 9  . pantoprazole (PROTONIX) 40 MG tablet TAKE  1 TABLET (40 MG TOTAL) BY MOUTH 2 (TWO) TIMES DAILY. (Patient taking differently: 40 mg daily. ) 60 tablet 9  . ranitidine (ZANTAC) 75 MG tablet Take 1 tablet (75 mg total) by mouth at bedtime as needed. (Patient taking differently: Take 75 mg by mouth at bedtime as needed. As needed) 30 tablet 11  . simvastatin (ZOCOR) 40 MG tablet Take 1 tablet (40 mg total) by mouth at bedtime. 30 tablet 9  . thyroid (ARMOUR THYROID) 60 MG tablet Take 1 tablet (60 mg total) by mouth daily. 30 tablet 9  . VENTOLIN HFA 108 (90 Base) MCG/ACT inhaler INHALE 2 PUFFS INTO THE LUNGS EVERY 6 (SIX) HOURS AS NEEDED FOR WHEEZING. 18 Inhaler 11  . XOLAIR 150 MG/ML SOSY INJECT 1 SYRINGE UNDER THE SKIN EVERY 4 WEEKS 1 Syringe 5   Current  Facility-Administered Medications on File Prior to Visit  Medication Dose Route Frequency Provider Last Rate Last Dose  . omalizumab Arvid Right) injection 150 mg  150 mg Subcutaneous Q14 Days Margaretha Seeds, MD   150 mg at 10/13/18 1603  . omalizumab Arvid Right) injection 150 mg  150 mg Subcutaneous Q14 Days Margaretha Seeds, MD   150 mg at 11/23/18 1646    Review of Systems:  As per HPI- otherwise negative.   Physical Examination: Vitals:   03/31/19 1352  BP: 138/80  Pulse: 66  Resp: 16  Temp: 98 F (36.7 C)  SpO2: 98%   Vitals:   03/31/19 1352  Weight: 177 lb (80.3 kg)  Height: 5\' 5"  (1.651 m)   Body mass index is 29.45 kg/m. Ideal Body Weight: Weight in (lb) to have BMI = 25: 149.9  GEN: WDWN, NAD, Non-toxic, A & O x 3, overweight, looks well HEENT: Atraumatic, Normocephalic. Neck supple. No masses, No LAD. Ears and Nose: No external deformity. CV: RRR, No M/G/R. No JVD. No thrill. No extra heart sounds. PULM: CTA B, no wheezes, crackles, rhonchi. No retractions. No resp. distress. No accessory muscle use. EXTR: No c/c/e NEURO Normal gait.  PSYCH: Normally interactive. Conversant. Not depressed or anxious appearing.  Calm demeanor.   Her right upper chest/ axilla shows a nickel sized skin lesion c/w ring worm  No other skin lesions noted She is still worried that she could have lyme disease    Assessment and Plan:   ICD-10-CM   1. Skin lesion  L98.9 doxycycline (VIBRAMYCIN) 100 MG capsule   Here today with a skin lesion which likely represents ringworm.  Suspect she caught it from the cats she is taken care of.  We will have her use a topical antifungal cream.  Amanda [name] is still fairly worried that this could also be Lyme disease, she would feel more comfortable with a prescription for doxycycline.  This is unlikely to harm her, so I did provide a prescription today  She will follow-up if her skin lesion does not clear up, or if any other concerns  Follow-up: No  follow-ups on file.  Meds ordered this encounter  Medications  . doxycycline (VIBRAMYCIN) 100 MG capsule    Sig: Take 1 capsule (100 mg total) by mouth 2 (two) times daily.    Dispense:  20 capsule    Refill:  0   No orders of the defined types were placed in this encounter.   @SIGN @  Outpatient Encounter Medications as of 03/31/2019  Medication Sig  . aspirin 81 MG tablet Take 81 mg by mouth daily.   . cetirizine (ZYRTEC) 10 MG  tablet Take 10 mg by mouth daily as needed.  . cholecalciferol (VITAMIN D) 1000 units tablet Take 2,000 Units by mouth daily.   . Fluticasone-Salmeterol (ADVAIR DISKUS) 500-50 MCG/DOSE AEPB INHALE ONE PUFF BY MOUTH EVERY TWELVE HOURS  . HYDROcodone-homatropine (HYCODAN) 5-1.5 MG/5ML syrup Take 5 mLs by mouth every 8 (eight) hours as needed for cough.  Marland Kitchen lisinopril (ZESTRIL) 5 MG tablet TAKE 1 TABLET BY MOUTH EVERY DAY  . montelukast (SINGULAIR) 10 MG tablet Take 1 tablet (10 mg total) by mouth at bedtime.  . pantoprazole (PROTONIX) 40 MG tablet TAKE 1 TABLET (40 MG TOTAL) BY MOUTH 2 (TWO) TIMES DAILY. (Patient taking differently: 40 mg daily. )  . ranitidine (ZANTAC) 75 MG tablet Take 1 tablet (75 mg total) by mouth at bedtime as needed. (Patient taking differently: Take 75 mg by mouth at bedtime as needed. As needed)  . simvastatin (ZOCOR) 40 MG tablet Take 1 tablet (40 mg total) by mouth at bedtime.  Marland Kitchen thyroid (ARMOUR THYROID) 60 MG tablet Take 1 tablet (60 mg total) by mouth daily.  . VENTOLIN HFA 108 (90 Base) MCG/ACT inhaler INHALE 2 PUFFS INTO THE LUNGS EVERY 6 (SIX) HOURS AS NEEDED FOR WHEEZING.  Marland Kitchen doxycycline (VIBRAMYCIN) 100 MG capsule Take 1 capsule (100 mg total) by mouth 2 (two) times daily.  Arvid Right 150 MG/ML SOSY INJECT 1 SYRINGE UNDER THE SKIN EVERY 4 WEEKS   Facility-Administered Encounter Medications as of 03/31/2019  Medication  . omalizumab Arvid Right) injection 150 mg  . omalizumab Arvid Right) injection 150 mg     Signed Lamar Blinks,  MD

## 2019-03-31 ENCOUNTER — Other Ambulatory Visit: Payer: Self-pay

## 2019-03-31 ENCOUNTER — Ambulatory Visit (INDEPENDENT_AMBULATORY_CARE_PROVIDER_SITE_OTHER): Payer: BC Managed Care – PPO | Admitting: Family Medicine

## 2019-03-31 ENCOUNTER — Encounter: Payer: Self-pay | Admitting: Family Medicine

## 2019-03-31 VITALS — BP 138/80 | HR 66 | Temp 98.0°F | Resp 16 | Ht 65.0 in | Wt 177.0 lb

## 2019-03-31 DIAGNOSIS — L989 Disorder of the skin and subcutaneous tissue, unspecified: Secondary | ICD-10-CM | POA: Diagnosis not present

## 2019-03-31 MED ORDER — DOXYCYCLINE HYCLATE 100 MG PO CAPS: 100.0000 mg | ORAL_CAPSULE | Freq: Two times a day (BID) | ORAL | 0 refills | Status: DC

## 2019-03-31 NOTE — Patient Instructions (Signed)
Try some OTC clotrimazole or terbinafine cream on your rash -use according to directions for ring worm. I suspect you may have gotten this from your cat- perhaps ask your vet if any treatment is needed for your animals I will give you an rx for doxycycline just in case this could be Lyme- take twice a day for 10 days with food and water Let me know if any other concerns

## 2019-04-16 ENCOUNTER — Telehealth: Payer: Self-pay | Admitting: Pulmonary Disease

## 2019-04-16 DIAGNOSIS — J4550 Severe persistent asthma, uncomplicated: Secondary | ICD-10-CM | POA: Diagnosis not present

## 2019-04-16 NOTE — Telephone Encounter (Signed)
Xolair Prefilled Syringe Order: 150mg  Prefilled Syringe:  #1 75mg  Prefilled Syringe: N/A Ordered Date: 04/16/2019 Expected date of arrival: 04/20/2019 Ordered by: Desmond Dike, [place] Pharmacy: [facility]

## 2019-04-20 NOTE — Telephone Encounter (Signed)
Xolair Prefilled Syringe Received:  150mg  Prefilled Syringe >> quantity 1, lot # [id number], exp date 01/2020 Medication arrival date: 04/20/2019 Received by: TBS

## 2019-04-27 ENCOUNTER — Ambulatory Visit (INDEPENDENT_AMBULATORY_CARE_PROVIDER_SITE_OTHER): Payer: BC Managed Care – PPO

## 2019-04-27 ENCOUNTER — Other Ambulatory Visit: Payer: Self-pay

## 2019-04-27 DIAGNOSIS — J4540 Moderate persistent asthma, uncomplicated: Secondary | ICD-10-CM | POA: Diagnosis not present

## 2019-04-27 MED ORDER — OMALIZUMAB 150 MG ~~LOC~~ SOLR
150.0000 mg | SUBCUTANEOUS | Status: DC
  Administered 2019-04-27: 150 mg via SUBCUTANEOUS

## 2019-04-27 NOTE — Progress Notes (Signed)
All questions were answered by the patient before medication was administered. Have you been hospitalized in the last 10 days? No Do you have a fever? No Do you have a cough? No Do you have a headache or sore throat? No   NCD# was not in look up box so typed in comments  Patient requests Left Arm injection 'always' she states

## 2019-05-03 ENCOUNTER — Other Ambulatory Visit: Payer: Self-pay | Admitting: Pulmonary Disease

## 2019-05-03 ENCOUNTER — Other Ambulatory Visit: Payer: Self-pay | Admitting: Family Medicine

## 2019-05-06 ENCOUNTER — Encounter: Payer: Self-pay | Admitting: Pulmonary Disease

## 2019-05-06 ENCOUNTER — Ambulatory Visit: Payer: BC Managed Care – PPO | Admitting: Pulmonary Disease

## 2019-05-06 ENCOUNTER — Other Ambulatory Visit: Payer: Self-pay

## 2019-05-06 VITALS — BP 136/74 | HR 80 | Temp 98.2°F | Ht 65.0 in | Wt 175.0 lb

## 2019-05-06 DIAGNOSIS — J4550 Severe persistent asthma, uncomplicated: Secondary | ICD-10-CM | POA: Diagnosis not present

## 2019-05-06 DIAGNOSIS — Z7952 Long term (current) use of systemic steroids: Secondary | ICD-10-CM | POA: Diagnosis not present

## 2019-05-06 NOTE — Progress Notes (Signed)
Subjective:   PATIENT ID: Amanda [name] GENDER: female DOB: 01-Jun-1956, MRN: [id number]   HPI  Chief Complaint  Patient presents with  . Follow-up    asthma    Reason for Visit: Follow-up asthma  Amanda [name] is a 63 year old never smoker with severe persistent asthma with steroid dependence currently on Xolair, seasonal allergic rhinitis who present for follow-up for asthma  She was previously a patient of Dr. Lenna Gilford, whom she followed for many years. Last seen in October 2019 with stable asthma symptoms during that visit.   She has continued to receive her regular Xolair injections with the last one received on 7/14. She has been on Xolair since August 2017 and has not used steroids in many years. She is compliant with her Advair 500-50 mcg at least daily and occasionally twice a day when she feels shortness of breath, which only occurs twice a month. On average, she has shortness of breath 1-2 times a week with heavy exertion such as yardwork. Can be associated with cough or wheezing. Uses her inhaler on average once a week during spring and fall. Symptoms are triggered by exertion, illness, strong odors and allergies. Denies nocturnal symptoms. No fevers, chills, chest pain.  She endorses congestionShe takes Zyertec and feels this helps with her allergies.   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  Past Medical History:  Diagnosis Date  . Allergic rhinitis   . Asthma   . Gastroesophageal reflux disease with hiatal hernia   . History of shingles   . Hyperlipidemia   . Hypothyroidism   . Osteoarthritis   . Overweight(278.02)   . Vitamin D deficiency      No family history on file.   Social History   Occupational History  . Not on file  Tobacco Use  . Smoking status: Never Smoker  .  Smokeless tobacco: Never Used  Substance and Sexual Activity  . Alcohol use: Yes    Comment: social use  . Drug use: No  . Sexual activity: Not on file    Allergies  Allergen Reactions  . Azithromycin     zpak does not work for the pt     Outpatient Medications Prior to Visit  Medication Sig Dispense Refill  . aspirin 81 MG tablet Take 81 mg by mouth daily.     . cetirizine (ZYRTEC) 10 MG tablet Take 10 mg by mouth daily as needed.    . cholecalciferol (VITAMIN D) 1000 units tablet Take 2,000 Units by mouth daily.     Marland Kitchen doxycycline (VIBRAMYCIN) 100 MG capsule Take 1 capsule (100 mg total) by mouth 2 (two) times daily. 20 capsule 0  . Fluticasone-Salmeterol (ADVAIR DISKUS) 500-50 MCG/DOSE AEPB INHALE ONE PUFF BY MOUTH EVERY TWELVE HOURS 60 each 9  . HYDROcodone-homatropine (HYCODAN) 5-1.5 MG/5ML syrup Take 5 mLs by mouth every 8 (eight) hours as needed for cough. 60 mL 0  . lisinopril (ZESTRIL) 5 MG tablet TAKE 1 TABLET BY MOUTH EVERY DAY 90 tablet 1  . montelukast (SINGULAIR) 10 MG tablet Take 1 tablet (10 mg total) by mouth at bedtime. 30 tablet 9  . pantoprazole (PROTONIX) 40 MG tablet TAKE 1 TABLET (40 MG TOTAL) BY MOUTH 2 (TWO) TIMES DAILY. (Patient taking differently: 40 mg daily. ) 60 tablet 9  . ranitidine (ZANTAC) 75 MG tablet Take 1 tablet (75 mg total)  by mouth at bedtime as needed. (Patient taking differently: Take 75 mg by mouth at bedtime as needed. As needed) 30 tablet 11  . simvastatin (ZOCOR) 40 MG tablet Take 1 tablet (40 mg total) by mouth at bedtime. 30 tablet 9  . thyroid (ARMOUR THYROID) 60 MG tablet Take 1 tablet (60 mg total) by mouth daily. 30 tablet 9  . VENTOLIN HFA 108 (90 Base) MCG/ACT inhaler INHALE 2 PUFFS INTO THE LUNGS EVERY 6 (SIX) HOURS AS NEEDED FOR WHEEZING. 18 Inhaler 11  . XOLAIR 150 MG/ML SOSY INJECT 1 SYRINGE UNDER THE SKIN EVERY 4 WEEKS 1 Syringe 5   Facility-Administered Medications Prior to Visit  Medication Dose Route Frequency Provider Last  Rate Last Dose  . omalizumab Arvid Right) injection 150 mg  150 mg Subcutaneous Q14 Days Margaretha Seeds, MD   150 mg at 10/13/18 1603  . omalizumab Arvid Right) injection 150 mg  150 mg Subcutaneous Q14 Days Margaretha Seeds, MD   150 mg at 11/23/18 1646  . omalizumab Arvid Right) injection 150 mg  150 mg Subcutaneous Q14 Days Margaretha Seeds, MD   150 mg at 04/27/19 4944    Review of Systems  Constitutional: Positive for diaphoresis and malaise/fatigue. Negative for chills, fever and weight loss.  HENT: Positive for congestion. Negative for ear pain and sore throat.   Respiratory: Positive for cough, sputum production, shortness of breath and wheezing. Negative for hemoptysis.   Cardiovascular: Negative for chest pain, palpitations and leg swelling.  Gastrointestinal: Positive for heartburn. Negative for abdominal pain.  Genitourinary: Negative for frequency.  Musculoskeletal: Positive for joint pain and myalgias.  Skin: Positive for itching and rash.  Neurological: Positive for headaches. Negative for dizziness and weakness.  Endo/Heme/Allergies: Does not bruise/bleed easily.  Psychiatric/Behavioral: Negative for depression. The patient is not nervous/anxious.      Objective:   Vitals:   05/06/19 0919  BP: 136/74  Pulse: 80  Temp: 98.2 F (36.8 C)  TempSrc: Oral  SpO2: 98%  Weight: 175 lb (79.4 kg)  Height: 5\' 5"  (1.651 m)      Physical Exam: General: Well-appearing, no acute distress HENT: [place], AT Eyes: EOMI, no scleral icterus Respiratory: Clear to auscultation bilaterally.  No crackles, wheezing or rales Cardiovascular: RRR, -M/R/G, no JVD GI: BS+, soft, nontender Extremities:-Edema,-tenderness Neuro: AAO x4, CNII-XII grossly intact Skin: Intact, no rashes or bruising Psych: Normal mood, normal affect  Data Reviewed:  Imaging: CXR 01/27/17 - no acute cardiopulmonary abnormalities. No infiltrate, effusion or edema  PFT: 01/16/16 - Ratio 58 FEV1 43% FVC 58%  Interpretation: Severe obstructive defect  Labs: CBC with diff 08/06/18 - WBC 8.5 with Eos 5.3% (400 absolute eos)  Imaging, labs and tests noted above have been reviewed independently by me.    Assessment & Plan:   Discussion: 63 year old female with severe persistent asthma with steroid dependence currently controlled on Xolair since 2017 with no recent steroid use (>1 year). Hx of LRPD and allergic rhinitis  Continue scheduled Xolair Continue Advair 500-50 mcg daily. For more symptomatic days, use inhaler twice daily. Patient advised to call office if symptoms such as shortness of breath, wheezing or cough do not improve on inhaler use. Continue current anti-reflux and allergy medications  Health Maintenance Pneumonia - review at next visit Influenza Due at next visit  No orders of the defined types were placed in this encounter. No orders of the defined types were placed in this encounter.   Return in about 6 months (around  11/06/2019).  [place], MD [facility] 05/06/2019 8:25 AM  Office Number [phone]

## 2019-05-06 NOTE — Patient Instructions (Signed)
Keep all Xolair appointments in office  Follow up with Dr. Loanne Drilling in 6 Months. Call sooner if you need [place].

## 2019-05-13 DIAGNOSIS — Z7952 Long term (current) use of systemic steroids: Secondary | ICD-10-CM | POA: Insufficient documentation

## 2019-05-13 DIAGNOSIS — J4550 Severe persistent asthma, uncomplicated: Secondary | ICD-10-CM | POA: Insufficient documentation

## 2019-05-19 ENCOUNTER — Telehealth: Payer: Self-pay | Admitting: Pulmonary Disease

## 2019-05-19 DIAGNOSIS — J4550 Severe persistent asthma, uncomplicated: Secondary | ICD-10-CM | POA: Diagnosis not present

## 2019-05-19 NOTE — Telephone Encounter (Signed)
Received call today from [place], [place]  Lynette needed to confirm delivery date and locations for Xolair. [facility] address given. Xolair 150mg  every 4 weeks confirmed. Xolair delivery date 05/20/19.

## 2019-05-20 NOTE — Telephone Encounter (Signed)
Xolair Prefilled Syringe Received:  150mg  Prefilled Syringe >> quantity 1, lot [id number], exp date 03/07/2020 75mg  Prefilled Syringe >> n/a Medication arrival date: 05/20/19 Received by: Lattie Haw T,LPN

## 2019-05-24 ENCOUNTER — Other Ambulatory Visit: Payer: Self-pay | Admitting: *Deleted

## 2019-05-24 MED ORDER — OMALIZUMAB 150 MG/ML ~~LOC~~ SOSY: PREFILLED_SYRINGE | SUBCUTANEOUS | 5 refills | Status: DC

## 2019-05-25 ENCOUNTER — Ambulatory Visit: Payer: BC Managed Care – PPO

## 2019-05-26 ENCOUNTER — Encounter: Payer: Self-pay | Admitting: Family Medicine

## 2019-05-26 MED ORDER — LISINOPRIL 5 MG PO TABS: 5.0000 mg | ORAL_TABLET | Freq: Every day | ORAL | 1 refills | Status: DC

## 2019-06-01 ENCOUNTER — Telehealth: Payer: Self-pay | Admitting: Pulmonary Disease

## 2019-06-01 MED ORDER — EPINEPHRINE 0.3 MG/0.3ML IJ SOAJ: 0.3000 mg | Freq: Once | INTRAMUSCULAR | 5 refills | Status: DC

## 2019-06-01 NOTE — Telephone Encounter (Signed)
If she does not have a fever or acute wheezing- okay to get injection. Likely allergies

## 2019-06-01 NOTE — Telephone Encounter (Signed)
Called pt to confirm and scfeen for COVID pt states that she has a cough and runny nose. Pt states she has been taking OTC Tylenol cold and sinus for allergy relief. Pt is scheduled for injection tomorrow. Pt was advised that I would send a message to provider to determine if ok to keep appointment as scheduled and someone would be reaching back out to her today.

## 2019-06-01 NOTE — Telephone Encounter (Signed)
Patient is scheduled for Xolair injection at 0930, 06/02/19.  Message routed to [place], NP, app of the day to advise

## 2019-06-01 NOTE — Telephone Encounter (Signed)
Called and spoke with Patient.  Patient denies any fever, or acute wheezing. Patient requested new Epipen prescription to be sent to [facility] in [place]. Advised patient about Goodrx coupons, if needed for cost. Prescription sent.  Nothing further at this time.

## 2019-06-02 ENCOUNTER — Ambulatory Visit (INDEPENDENT_AMBULATORY_CARE_PROVIDER_SITE_OTHER): Payer: BC Managed Care – PPO

## 2019-06-02 ENCOUNTER — Other Ambulatory Visit: Payer: Self-pay

## 2019-06-02 DIAGNOSIS — J4550 Severe persistent asthma, uncomplicated: Secondary | ICD-10-CM | POA: Diagnosis not present

## 2019-06-02 DIAGNOSIS — Z7952 Long term (current) use of systemic steroids: Secondary | ICD-10-CM | POA: Diagnosis not present

## 2019-06-02 MED ORDER — OMALIZUMAB 150 MG/ML ~~LOC~~ SOSY
150.0000 mg | PREFILLED_SYRINGE | Freq: Once | SUBCUTANEOUS | Status: AC
  Administered 2019-06-02: 150 mg via SUBCUTANEOUS

## 2019-06-02 NOTE — Progress Notes (Signed)
Have you been hospitalized within the last 10 days?  No Do you have a fever?  No Do you have a cough?  No Do you have a headache or sore throat? No Do you have your Epi Pen visible and is it within date?  Yes 

## 2019-06-21 ENCOUNTER — Telehealth: Payer: Self-pay | Admitting: Pulmonary Disease

## 2019-06-21 NOTE — Telephone Encounter (Signed)
Xolair Prefilled Syringe Order: 150mg  Prefilled Syringe:  #1 75mg  Prefilled Syringe: N/A Ordered Date: 06/21/2019 Expected date of arrival: 06/22/2019 Ordered by: Desmond Dike, [place]  Specialty Pharmacy: [facility]

## 2019-06-28 DIAGNOSIS — J4550 Severe persistent asthma, uncomplicated: Secondary | ICD-10-CM | POA: Diagnosis not present

## 2019-06-28 NOTE — Telephone Encounter (Signed)
As of today, we have not received the pt's medication. Contacted [facility]. Was advised that they do not know why we didn't get the original shipment but they are going to reship this and we should receive it on 06/29/2019.

## 2019-06-29 NOTE — Telephone Encounter (Signed)
Xolair Prefilled Syringe Received:  150mg  Prefilled Syringe >> quantity #1, lot # [id number], exp date 03/07/2020 75mg  Prefilled Syringe >> N/A Medication arrival date: 06/29/19 Received by: Brianca Fortenberry,LPN

## 2019-06-30 ENCOUNTER — Ambulatory Visit (INDEPENDENT_AMBULATORY_CARE_PROVIDER_SITE_OTHER): Payer: BC Managed Care – PPO

## 2019-06-30 ENCOUNTER — Other Ambulatory Visit: Payer: Self-pay

## 2019-06-30 DIAGNOSIS — J4550 Severe persistent asthma, uncomplicated: Secondary | ICD-10-CM | POA: Diagnosis not present

## 2019-06-30 DIAGNOSIS — Z7952 Long term (current) use of systemic steroids: Secondary | ICD-10-CM | POA: Diagnosis not present

## 2019-06-30 MED ORDER — OMALIZUMAB 150 MG/ML ~~LOC~~ SOSY
150.0000 mg | PREFILLED_SYRINGE | Freq: Once | SUBCUTANEOUS | Status: AC
  Administered 2019-06-30: 150 mg via SUBCUTANEOUS

## 2019-06-30 NOTE — Progress Notes (Signed)
Have you been hospitalized within the last 10 days?  No Do you have a fever?  No Do you have a cough?  No Do you have a headache or sore throat? No Do you have your Epi Pen visible and is it within date?  Yes 

## 2019-07-19 ENCOUNTER — Telehealth: Payer: Self-pay | Admitting: Pulmonary Disease

## 2019-07-19 NOTE — Telephone Encounter (Signed)
Called [facility] to schedule Xolair 150mg  delivery.  [facility] representative stated Xolair was being run through [employer] and it would be 24-72 hours before shipment can be scheduled. Was instructed to call Wednesday 07/21/19, if no one has contacted office to set up delivery. Will follow up.

## 2019-07-21 NOTE — Telephone Encounter (Signed)
Called [facility] to set up [place] delivery.  Xolair is still being processed through insurance.  [facility] reached out to insurance and was unable to speak with someone.  [facility] was instructed to send insurance a stat e mail requesting Xolair be processed stat. [facility] rep stated stat process takes 24-48 hours. [facility] rep stated if no one has reached out to [facility] in 48 hours to set up Xolair delivery, to contact [facility]. Will follow up.

## 2019-07-23 NOTE — Telephone Encounter (Signed)
Called [facility]  to schedule Xolair shipment. Xolair order was placed 07/20/19, shipping set for 07/27/19,  and verified by rep. Xolair is still being processed through insurance. Darrell, [facility] rep stated he would have a stat request placed.  Darrell stated would should be contacted for problems. Will call Monday 07/26/19 to follow up on Xolair order. Patient is scheduled 07/30/19 for injection.

## 2019-07-26 DIAGNOSIS — J4550 Severe persistent asthma, uncomplicated: Secondary | ICD-10-CM | POA: Diagnosis not present

## 2019-07-26 NOTE — Telephone Encounter (Signed)
Called [facility] to check on Xolair shipment. Xolair is set for office delivery 07/27/19.

## 2019-07-28 NOTE — Telephone Encounter (Signed)
Xolair Prefilled Syringe Received:  150mg  Prefilled Syringe >> quantity #1, lot # [id number], exp date 03/2020 75mg  Prefilled Syringe >> N/A Medication arrival date: 07/27/2019 Received by: Desmond Dike, [place]

## 2019-07-30 ENCOUNTER — Other Ambulatory Visit: Payer: Self-pay

## 2019-07-30 ENCOUNTER — Ambulatory Visit (INDEPENDENT_AMBULATORY_CARE_PROVIDER_SITE_OTHER): Payer: BC Managed Care – PPO

## 2019-07-30 DIAGNOSIS — Z7952 Long term (current) use of systemic steroids: Secondary | ICD-10-CM | POA: Diagnosis not present

## 2019-07-30 DIAGNOSIS — J4550 Severe persistent asthma, uncomplicated: Secondary | ICD-10-CM

## 2019-07-30 MED ORDER — OMALIZUMAB 150 MG/ML ~~LOC~~ SOSY
150.0000 mg | PREFILLED_SYRINGE | Freq: Once | SUBCUTANEOUS | Status: AC
  Administered 2019-07-30: 150 mg via SUBCUTANEOUS

## 2019-07-30 NOTE — Progress Notes (Signed)
All questions were answered by the patient before medication was administered. Have you been hospitalized in the last 10 days? No Do you have a fever? No Do you have a cough? No Do you have a headache or sore throat? No  

## 2019-08-05 ENCOUNTER — Other Ambulatory Visit: Payer: Self-pay | Admitting: Pulmonary Disease

## 2019-08-09 ENCOUNTER — Encounter: Payer: Self-pay | Admitting: Family Medicine

## 2019-08-09 MED ORDER — FLUTICASONE-SALMETEROL 500-50 MCG/DOSE IN AEPB: INHALATION_SPRAY | RESPIRATORY_TRACT | 2 refills | Status: DC

## 2019-08-09 MED ORDER — PANTOPRAZOLE SODIUM 40 MG PO TBEC: DELAYED_RELEASE_TABLET | ORAL | 2 refills | Status: DC

## 2019-08-09 MED ORDER — MONTELUKAST SODIUM 10 MG PO TABS: 10.0000 mg | ORAL_TABLET | Freq: Every day | ORAL | 2 refills | Status: DC

## 2019-08-09 MED ORDER — ALBUTEROL SULFATE HFA 108 (90 BASE) MCG/ACT IN AERS: 1.0000 | INHALATION_SPRAY | RESPIRATORY_TRACT | 4 refills | Status: DC | PRN

## 2019-08-10 MED ORDER — THYROID 60 MG PO TABS: 60.0000 mg | ORAL_TABLET | Freq: Every day | ORAL | 9 refills | Status: DC

## 2019-08-10 MED ORDER — SIMVASTATIN 40 MG PO TABS: 40.0000 mg | ORAL_TABLET | Freq: Every day | ORAL | 9 refills | Status: DC

## 2019-08-16 ENCOUNTER — Telehealth: Payer: Self-pay | Admitting: Pulmonary Disease

## 2019-08-16 NOTE — Telephone Encounter (Signed)
Xolair Prefilled Syringe Order: 150mg  Prefilled Syringe:  #1 75mg  Prefilled Syringe: N/A Ordered Date: 08/16/2019 Expected date of arrival: 08/24/2019 Ordered by: Desmond Dike, [place]  Specialty Pharmacy: [facility]

## 2019-08-21 DIAGNOSIS — J4550 Severe persistent asthma, uncomplicated: Secondary | ICD-10-CM | POA: Diagnosis not present

## 2019-08-24 NOTE — Telephone Encounter (Signed)
Xolair Prefilled Syringe Received:  150mg  Prefilled Syringe >> quantity #1, lot # [id number], exp date 04/06/2020 75mg  Prefilled Syringe >> N/A Medication arrival date: 08/24/19 Received by: Samyah Bilbo,LPN

## 2019-08-27 ENCOUNTER — Other Ambulatory Visit: Payer: Self-pay

## 2019-08-27 ENCOUNTER — Ambulatory Visit (INDEPENDENT_AMBULATORY_CARE_PROVIDER_SITE_OTHER): Payer: BC Managed Care – PPO

## 2019-08-27 DIAGNOSIS — J4550 Severe persistent asthma, uncomplicated: Secondary | ICD-10-CM | POA: Diagnosis not present

## 2019-08-27 DIAGNOSIS — Z7952 Long term (current) use of systemic steroids: Secondary | ICD-10-CM | POA: Diagnosis not present

## 2019-08-27 MED ORDER — OMALIZUMAB 150 MG/ML ~~LOC~~ SOSY
150.0000 mg | PREFILLED_SYRINGE | Freq: Once | SUBCUTANEOUS | Status: AC
  Administered 2019-08-27: 09:00:00 150 mg via SUBCUTANEOUS

## 2019-08-27 NOTE — Progress Notes (Signed)
Have you been hospitalized within the last 10 days?  No Do you have a fever?  No Do you have a cough?  No Do you have a headache or sore throat? No Do you have your Epi Pen visible and is it within date?  Yes 

## 2019-09-14 ENCOUNTER — Telehealth: Payer: Self-pay | Admitting: Pulmonary Disease

## 2019-09-14 NOTE — Telephone Encounter (Signed)
ATC [facility] to order Xolair.  Xolair is currently being processed with insurance.  I was told to call back 09/16/19 to schedule Xolair for 09/21/19 delivery. Will follow up 09/16/19.

## 2019-09-16 NOTE — Telephone Encounter (Signed)
Xolair Prefilled Syringe Order: 150mg  Prefilled Syringe:  #1 75mg  Prefilled Syringe: #N/A Ordered Date: 09/16/19 Expected date of arrival: 09/23/19 Ordered by: Schneider Warchol,LPN Specialty Pharmacy: [facility]

## 2019-09-18 DIAGNOSIS — J4550 Severe persistent asthma, uncomplicated: Secondary | ICD-10-CM | POA: Diagnosis not present

## 2019-09-23 NOTE — Telephone Encounter (Signed)
Xolair Prefilled Syringe Received:  150mg  Prefilled Syringe >> quantity #1, lot # [id number], exp date 05/2020 75mg  Prefilled Syringe >> N/A Medication arrival date: 09/23/2019 Received by: Desmond Dike, [place]

## 2019-09-24 ENCOUNTER — Other Ambulatory Visit: Payer: Self-pay

## 2019-09-24 ENCOUNTER — Ambulatory Visit (INDEPENDENT_AMBULATORY_CARE_PROVIDER_SITE_OTHER): Payer: BC Managed Care – PPO

## 2019-09-24 DIAGNOSIS — J4550 Severe persistent asthma, uncomplicated: Secondary | ICD-10-CM

## 2019-09-24 DIAGNOSIS — Z7952 Long term (current) use of systemic steroids: Secondary | ICD-10-CM

## 2019-09-24 MED ORDER — OMALIZUMAB 150 MG/ML ~~LOC~~ SOSY
150.0000 mg | PREFILLED_SYRINGE | Freq: Once | SUBCUTANEOUS | Status: AC
  Administered 2019-09-24: 09:00:00 150 mg via SUBCUTANEOUS

## 2019-09-24 NOTE — Progress Notes (Signed)
Have you been hospitalized within the last 10 days?  No Do you have a fever?  No Do you have a cough?  No Do you have a headache or sore throat? No Do you have your Epi Pen visible and is it within date?  Yes 

## 2019-10-19 ENCOUNTER — Telehealth: Payer: Self-pay | Admitting: Pulmonary Disease

## 2019-10-19 NOTE — Telephone Encounter (Signed)
Called [facility] to schedule Xolair delivery.  At this time insurance is processing and [facility] is needing Patient consent for office shipment. Will follow up later this week.

## 2019-10-21 NOTE — Telephone Encounter (Signed)
Called [facility] to set up delivery.  At this time order is still being processed through insurance major medical. Will try again 10/22/19 to set up delivery.

## 2019-10-22 NOTE — Telephone Encounter (Signed)
[place] is still in Pharmacist, community. Was told  Verification can take 3-5 business days.  Will try to set up shipment 10/25/19.

## 2019-10-22 NOTE — Telephone Encounter (Signed)
Called and spoke with Patient.  Patient stated she is having problems scheduling routine asthma follow up with Dr. Loanne Drilling.  Patient scheduled 10/28/19 at 1530 with Dr. Loanne Drilling. Patient made aware Xolair order has been placed and we are waiting for shipment date and insurance verification.

## 2019-10-25 NOTE — Telephone Encounter (Signed)
Called [facility] to follow up and schedule Xolair delivery. Xolair is still being process through insurance.  Stat request placed per Annie Main, [employer], Xolair needs shipment 1/19 to be in office at 0900 1/20 injection appointment.

## 2019-10-26 ENCOUNTER — Telehealth: Payer: Self-pay | Admitting: Pulmonary Disease

## 2019-10-26 DIAGNOSIS — J4550 Severe persistent asthma, uncomplicated: Secondary | ICD-10-CM | POA: Diagnosis not present

## 2019-10-26 NOTE — Telephone Encounter (Signed)
See other note

## 2019-10-26 NOTE — Telephone Encounter (Signed)
Called [facility] to follow up on Patient Xolair delivery.  Erline Levine, [facility] Representative stated patient's major medical insurance is hold up. Erline Levine stated there have been 2 stat orders placed, with no response from insurance department.  Erline Levine spoke with her supervisor, who is reaching out to insurance to move this process forward. [facility] is aware of upcoming Patient appointment.  Will follow up this afternoon.

## 2019-10-26 NOTE — Telephone Encounter (Signed)
Called [facility] for [place] updates.  They are currently processing order at this time. Was advised to follow up in 2-3 hours for updates.

## 2019-10-26 NOTE — Telephone Encounter (Signed)
Called [facility] to follow up [place] shipment.  I  was told Xolair needed a new PA and insurance had questions that needed to be answered. [employer] and spoke with Shawn. Shawn stated Xolair PA is approved through 11/17/19, and nothing was needed. Xolair should be shipped to office with no problems. [facility] back and spoke with [place].  Explained what I was told by [facility]. Jonni Sanger stated Xolair could be shipped and arrived by 10/27/19. Called and spoke with Patient.  Patient has OV with Dr. Florina Ou 10/28/19.  Patient will receive Xolair injection at [place].  Xolair Prefilled Syringe Order: 150mg  Prefilled Syringe:  #1 75mg  Prefilled Syringe: #N/A Ordered Date: 10/26/19 Expected date of arrival: 10/27/19 Ordered by: Tressy Kunzman,LPN Specialty Pharmacy: [facility]

## 2019-10-27 ENCOUNTER — Ambulatory Visit: Payer: BC Managed Care – PPO

## 2019-10-27 NOTE — Telephone Encounter (Signed)
Xolair Prefilled Syringe Received:  150mg  Prefilled Syringe >> quantity #1, lot # [id number], exp date 06/07/2020 75mg  Prefilled Syringe  N/A Medication arrival date: 10/27/19 Received by: Esmay Amspacher,LPN

## 2019-10-28 ENCOUNTER — Ambulatory Visit: Payer: BC Managed Care – PPO | Admitting: Pulmonary Disease

## 2019-10-28 ENCOUNTER — Ambulatory Visit: Payer: BC Managed Care – PPO

## 2019-10-28 ENCOUNTER — Other Ambulatory Visit: Payer: Self-pay

## 2019-10-28 DIAGNOSIS — Z7952 Long term (current) use of systemic steroids: Secondary | ICD-10-CM

## 2019-10-28 DIAGNOSIS — J4550 Severe persistent asthma, uncomplicated: Secondary | ICD-10-CM | POA: Diagnosis not present

## 2019-10-28 MED ORDER — FLUTICASONE-SALMETEROL 500-50 MCG/DOSE IN AEPB: INHALATION_SPRAY | RESPIRATORY_TRACT | 6 refills | Status: DC

## 2019-10-28 MED ORDER — MONTELUKAST SODIUM 10 MG PO TABS: 10.0000 mg | ORAL_TABLET | Freq: Every day | ORAL | 2 refills | Status: DC

## 2019-10-28 MED ORDER — OMALIZUMAB 150 MG/ML ~~LOC~~ SOSY
150.0000 mg | PREFILLED_SYRINGE | Freq: Once | SUBCUTANEOUS | Status: AC
  Administered 2019-10-28: 150 mg via SUBCUTANEOUS

## 2019-10-28 MED ORDER — ALBUTEROL SULFATE HFA 108 (90 BASE) MCG/ACT IN AERS: 1.0000 | INHALATION_SPRAY | RESPIRATORY_TRACT | 12 refills | Status: DC | PRN

## 2019-10-28 NOTE — Progress Notes (Signed)
Subjective:   PATIENT ID: Amanda [name] GENDER: female DOB: [birth year], MRN: [id number]   HPI  Chief Complaint  Patient presents with  . Follow-up    6 month follow up no current health issues.    Reason for Visit: Follow-up asthma  [name] is a 64 year old never smoker with severe persistent asthma with steroid dependence currently on Xolair, seasonal allergic rhinitis who present for follow-up for asthma  She was previously a patient of Dr. Lenna [name], whom she followed for many years. Last seen in October 2019 with stable asthma symptoms during that visit.   Since last visit, she has had no exacerbations. She is compliant with Advair 500-50 once a day. During transitions of seasons she will need two puffs daily for wheezing and shortness of breath. Usually her symptoms do not last more than 2 days. Symptoms are triggered by exertion, illness, strong odors and allergies. Denies nocturnal symptoms. Last Nucala injection today.   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  Past Medical History:  Diagnosis Date  . Allergic rhinitis   . Asthma   . Gastroesophageal reflux disease with hiatal hernia   . History of shingles   . Hyperlipidemia   . Hypothyroidism   . Osteoarthritis   . Overweight(278.02)   . Vitamin D deficiency     Allergies  Allergen Reactions  . Azithromycin     zpak does not work for the pt     Outpatient Medications Prior to Visit  Medication Sig Dispense Refill  . aspirin 81 MG tablet Take 81 mg by mouth daily.     . cetirizine (ZYRTEC) 10 MG tablet Take 10 mg by mouth daily as needed.    . cholecalciferol (VITAMIN D) 1000 units tablet Take 2,000 Units by mouth daily.     . Fluticasone-Salmeterol (ADVAIR DISKUS) 500-50 MCG/DOSE AEPB INHALE ONE PUFF BY MOUTH EVERY TWELVE HOURS  60 each 2  . lisinopril (ZESTRIL) 5 MG tablet Take 1 tablet (5 mg total) by mouth daily. 90 tablet 1  . montelukast (SINGULAIR) 10 MG tablet Take 1 tablet (10 mg total) by mouth at bedtime. 30 tablet 2  . omalizumab (XOLAIR) 150 MG/ML prefilled syringe INJECT 1 SYRINGE UNDER THE SKIN EVERY 4 WEEKS 1 mL 5  . pantoprazole (PROTONIX) 40 MG tablet TAKE 1 TABLET (40 MG TOTAL) BY MOUTH 2 (TWO) TIMES DAILY. 60 tablet 2  . ranitidine (ZANTAC) 75 MG tablet Take 1 tablet (75 mg total) by mouth at bedtime as needed. (Patient taking differently: Take 75 mg by mouth at bedtime as needed. As needed) 30 tablet 11  . simvastatin (ZOCOR) 40 MG tablet Take 1 tablet (40 mg total) by mouth at bedtime. 30 tablet 9  . thyroid (ARMOUR THYROID) 60 MG tablet Take 1 tablet (60 mg total) by mouth daily. 30 tablet 9  . albuterol (VENTOLIN HFA) 108 (90 Base) MCG/ACT inhaler Inhale 1-2 puffs into the lungs every 4 (four) hours as needed for wheezing or shortness of breath. 6.7 g 4  . doxycycline (VIBRAMYCIN) 100 MG capsule Take 1 capsule (100 mg total) by mouth 2 (two) times daily. 20 capsule 0  . HYDROcodone-homatropine (HYCODAN) 5-1.5 MG/5ML syrup Take 5 mLs by mouth every 8 (eight) hours as needed for cough. 60 mL 0   Facility-Administered Medications Prior to Visit  Medication Dose Route Frequency Provider Last Rate Last  Admin  . omalizumab Arvid Right) injection 150 mg  150 mg Subcutaneous Q14 Days Margaretha Seeds, MD   150 mg at 10/13/18 1603  . omalizumab Arvid Right) injection 150 mg  150 mg Subcutaneous Q14 Days Margaretha Seeds, MD   150 mg at 11/23/18 1646  . omalizumab Arvid Right) injection 150 mg  150 mg Subcutaneous Q14 Days Margaretha Seeds, MD   150 mg at 04/27/19 [id number]    Review of Systems  Constitutional: Negative for chills, diaphoresis, fever, malaise/fatigue and weight loss.  HENT: Negative for congestion.   Respiratory: Negative for cough, hemoptysis, sputum production, shortness of breath and wheezing.    Cardiovascular: Negative for chest pain, palpitations and leg swelling.   Objective:   There were no vitals filed for this visit.   Physical Exam: General: Well-appearing, no acute distress HENT: [place], AT Eyes: EOMI, no scleral icterus Respiratory: Clear to auscultation bilaterally.  No crackles, wheezing or rales Cardiovascular: RRR, -M/R/G, no JVD GI: BS+, soft, nontender Extremities:-Edema,-tenderness Neuro: AAO x4, CNII-XII grossly intact Skin: Intact, no rashes or bruising Psych: Normal mood, normal affect  Data Reviewed:  Imaging: CXR 01/27/17 - no acute cardiopulmonary abnormalities. No infiltrate, effusion or edema  PFT: 01/16/16 - Ratio 58 FEV1 43% FVC 58% Interpretation: Severe obstructive defect  Labs: CBC with diff 08/06/18 - WBC 8.5 with Eos 5.3% (400 absolute eos)  Imaging, labs and tests noted above have been reviewed independently by me.    Assessment & Plan:   Discussion: 64 year old female with severe persistent asthma with prior steroid dependence currently controlled on LABA/ICS and Xolair since 2017. Well-controlled.  Continue scheduled Xolair REFILL Advair 500-50 mcg once daily. This is your EVERYDAY inhaler Continue Albuterol inhaler as needed. This is your rescue inhaler REFILL Singulair 10 mg daily  Asthma Action Plan For more symptomatic days, use Advair inhaler twice daily.  Please call office if symptoms such as shortness of breath, wheezing or cough do not improve after inhaler use.  Health Maintenance Immunization History  Administered Date(s) Administered  . Tdap 01/21/2011   No orders of the defined types were placed in this encounter.  Meds ordered this encounter  Medications  . omalizumab Arvid Right) prefilled syringe 150 mg  . albuterol (VENTOLIN HFA) 108 (90 Base) MCG/ACT inhaler    Sig: Inhale 1-2 puffs into the lungs every 4 (four) hours as needed for wheezing or shortness of breath.    Dispense:  8 g    Refill:  12  .  Fluticasone-Salmeterol (ADVAIR DISKUS) 500-50 MCG/DOSE AEPB    Sig: INHALE ONE PUFF BY MOUTH EVERY TWELVE HOURS    Dispense:  60 each    Refill:  6  . montelukast (SINGULAIR) 10 MG tablet    Sig: Take 1 tablet (10 mg total) by mouth at bedtime.    Dispense:  90 tablet    Refill:  2   Return in about 8 months (around 06/27/2020) for with me.  [place], MD [facility] 10/28/2019 4:03 PM  Office Number [phone]

## 2019-10-28 NOTE — Patient Instructions (Addendum)
Severe Persistent Asthma Continue scheduled Xolair Continue Advair 500-50 mcg once daily. This is your EVERYDAY inhaler Continue Albuterol inhaler as needed. This is your rescue inhaler Continue Singulair 10 mg daily  Asthma Action Plan For more symptomatic days, use Advair inhaler twice daily.  Please call office if symptoms such as shortness of breath, wheezing or cough do not improve after inhaler use.   If you are interested in receiving your Covid-19 vaccine. You can visit [email or web address] and sign up through [facility].  Or if you want to schedule through [facility] Department you can visit Online at [email or web address] or By phone at [phone] (Option 2) from 8:00 am-5:00 pm, until all appointment slots have been filled.

## 2019-10-28 NOTE — Progress Notes (Signed)
Have you been hospitalized within the last 10 days?  No Do you have a fever?  No Do you have a cough?  No Do you have a headache or sore throat? No Do you have your Epi Pen visible and is it within date?  Yes 

## 2019-10-31 ENCOUNTER — Other Ambulatory Visit: Payer: Self-pay | Admitting: Pulmonary Disease

## 2019-11-01 ENCOUNTER — Telehealth: Payer: Self-pay

## 2019-11-01 DIAGNOSIS — Z13220 Encounter for screening for lipoid disorders: Secondary | ICD-10-CM

## 2019-11-01 DIAGNOSIS — I10 Essential (primary) hypertension: Secondary | ICD-10-CM

## 2019-11-01 DIAGNOSIS — Z1159 Encounter for screening for other viral diseases: Secondary | ICD-10-CM

## 2019-11-01 DIAGNOSIS — Z131 Encounter for screening for diabetes mellitus: Secondary | ICD-10-CM

## 2019-11-01 DIAGNOSIS — E039 Hypothyroidism, unspecified: Secondary | ICD-10-CM

## 2019-11-01 NOTE — Addendum Note (Signed)
Addended by: Lamar Blinks C on: 11/01/2019 03:47 PM   Modules accepted: Orders

## 2019-11-01 NOTE — Telephone Encounter (Signed)
Patient sent message stating that she is bringing her mom in for labs on 11/11/2019 as you requested. She states you "have been on to her for labs for almost a year now" and would like to come in for labs as well. I have scheduled her for the same day at 9:30 just in case the lab schedule filled up. Could you please place lab orders?

## 2019-11-11 ENCOUNTER — Other Ambulatory Visit (INDEPENDENT_AMBULATORY_CARE_PROVIDER_SITE_OTHER): Payer: BC Managed Care – PPO

## 2019-11-11 ENCOUNTER — Other Ambulatory Visit: Payer: Self-pay

## 2019-11-11 DIAGNOSIS — Z131 Encounter for screening for diabetes mellitus: Secondary | ICD-10-CM | POA: Diagnosis not present

## 2019-11-11 DIAGNOSIS — Z1159 Encounter for screening for other viral diseases: Secondary | ICD-10-CM

## 2019-11-11 DIAGNOSIS — Z13220 Encounter for screening for lipoid disorders: Secondary | ICD-10-CM

## 2019-11-11 DIAGNOSIS — I10 Essential (primary) hypertension: Secondary | ICD-10-CM | POA: Diagnosis not present

## 2019-11-11 DIAGNOSIS — E039 Hypothyroidism, unspecified: Secondary | ICD-10-CM | POA: Diagnosis not present

## 2019-11-11 LAB — COMPREHENSIVE METABOLIC PANEL
ALT: 15 U/L (ref 0–35)
AST: 14 U/L (ref 0–37)
Albumin: 4.4 g/dL (ref 3.5–5.2)
Alkaline Phosphatase: 86 U/L (ref 39–117)
BUN: 14 mg/dL (ref 6–23)
CO2: 28 mEq/L (ref 19–32)
Calcium: 9.4 mg/dL (ref 8.4–10.5)
Chloride: 103 mEq/L (ref 96–112)
Creatinine, Ser: 0.77 mg/dL (ref 0.40–1.20)
GFR: 75.72 mL/min (ref >60.00)
Glucose, Bld: 108 mg/dL — ABNORMAL HIGH (ref 70–99)
Potassium: 4.1 mEq/L (ref 3.5–5.1)
Sodium: 139 mEq/L (ref 135–145)
Total Bilirubin: 0.5 mg/dL (ref 0.2–1.2)
Total Protein: 6.4 g/dL (ref 6.0–8.3)

## 2019-11-11 LAB — TSH: TSH: 4.34 u[IU]/mL (ref 0.35–4.50)

## 2019-11-11 LAB — CBC
HCT: 40.5 % (ref 36.0–46.0)
Hemoglobin: 13.5 g/dL (ref 12.0–15.0)
MCHC: 33.5 g/dL (ref 30.0–36.0)
MCV: 93 fl (ref 78.0–100.0)
Platelets: 258 10*3/uL (ref 150.0–400.0)
RBC: 4.35 Mil/uL (ref 3.87–5.11)
RDW: 13.5 % (ref 11.5–15.5)
WBC: 6.4 10*3/uL (ref 4.0–10.5)

## 2019-11-11 LAB — LIPID PANEL
Cholesterol: 166 mg/dL (ref 0–200)
HDL: 59.9 mg/dL (ref >39.00)
LDL Cholesterol: 94 mg/dL (ref 0–99)
NonHDL: 106.56
Total CHOL/HDL Ratio: 3
Triglycerides: 63 mg/dL (ref 0.0–149.0)
VLDL: 12.6 mg/dL (ref 0.0–40.0)

## 2019-11-11 LAB — HEMOGLOBIN A1C: Hgb A1c MFr Bld: 6.2 % (ref 4.6–6.5)

## 2019-11-12 ENCOUNTER — Encounter: Payer: Self-pay | Admitting: Family Medicine

## 2019-11-12 LAB — HEPATITIS C ANTIBODY
Hepatitis C Ab: NONREACTIVE
SIGNAL TO CUT-OFF: 0.01 (ref <1.00)

## 2019-11-15 ENCOUNTER — Telehealth: Payer: Self-pay | Admitting: Pulmonary Disease

## 2019-11-15 NOTE — Telephone Encounter (Signed)
Called [facility] to schedule Xolair delivery, spoke with [place]. Jasmine stated Xolair can not be ordered until 11/22/19.  Jasmine stated medication can be shipped 2/16 for 2/18 injection appointment. Xolair PA expires 11/17/19.  Will follow up with PA 2/10.

## 2019-11-18 MED ORDER — OMALIZUMAB 150 MG/ML ~~LOC~~ SOSY: PREFILLED_SYRINGE | SUBCUTANEOUS | 5 refills | Status: DC

## 2019-11-18 NOTE — Telephone Encounter (Signed)
Medication name and strength: Xolair 150mg  every 4 weeks Provider: Dr. Loanne Drilling Pharmacy: [facility] Patient insurance ID: [id number] Phone: [phone] Fax: [phone]  Was the PA started on CMM?  no If yes, please enter the Key: n/a Timeframe for approval/denial: 48-72 hours  Case # [id number]

## 2019-11-18 NOTE — Telephone Encounter (Signed)
Called [employer] [place] to initiate new Xolair PA, spoke with [place].  Patient information given.  Erline Levine stated [employer] PA form is being faxed for completion. From needs to have case # [id number] on cover sheet, and be faxed to [place].

## 2019-11-19 ENCOUNTER — Other Ambulatory Visit: Payer: Self-pay | Admitting: Family Medicine

## 2019-11-22 NOTE — Telephone Encounter (Signed)
[facility]ed [facility] to schedule Xolair shipment.  Consent to ship to office is needed, before Xolair can be shipped. Xolair is currently being processed through insurance. Representative stated once consent received Xolair should arrive before Thursday appointment. Patient made aware through my chart.

## 2019-11-23 NOTE — Telephone Encounter (Signed)
[facility]ed [facility] to follow up on [place], spoke with [place].  Sharyn Lull stated Xolair is still being verified by insurance [employer]. Vicenta Dunning of Patient's recent my chart message of medication being delivered 11/25/19, by 0845. Sharyn Lull reached out to insurance team, who stated Xolair is still being verified through insurance. Will follow up to schedule shipment.

## 2019-11-24 NOTE — Telephone Encounter (Signed)
Called and spoke with [facility],  [facility], to follow up on Patient's Xolair shipment. Stanton Kidney stated Xolair is still being processed through [employer]. Stanton Kidney stated she is changing Patient order status to stat, and sent a message to insurance, to process, and contact office to set up shipment.

## 2019-11-24 NOTE — Telephone Encounter (Signed)
[employer], spoke with Amanda [name], who looked into Xolair insurance progress. Xolair is still in [employer], but should be approved 11/25/19. Patient is aware and scheduled 11/23/19 for Xolair injection. Will need to follow up 11/29/19 for shipment.

## 2019-11-25 ENCOUNTER — Ambulatory Visit: Payer: BC Managed Care – PPO

## 2019-11-29 DIAGNOSIS — J4550 Severe persistent asthma, uncomplicated: Secondary | ICD-10-CM | POA: Diagnosis not present

## 2019-11-29 NOTE — Telephone Encounter (Signed)
Called [facility] to follow up on pt's shipment of Xolair.  Xolair Prefilled Syringe Order: 150mg  Prefilled Syringe:  #1 75mg  Prefilled Syringe: N/A Ordered Date: 11/29/2019 Expected date of arrival: 12/02/2019 Ordered by: Desmond Dike, [place]  Specialty Pharmacy: [facility]

## 2019-12-02 NOTE — Telephone Encounter (Signed)
Xolair Prefilled Syringe Received:  150mg  Prefilled Syringe >> quantity #1, lot # [id number], exp date 07/2020 75mg  Prefilled Syringe >> N/A Medication arrival date: 12/02/2019 Received by: Desmond Dike, [place]

## 2019-12-03 ENCOUNTER — Other Ambulatory Visit: Payer: Self-pay

## 2019-12-03 ENCOUNTER — Ambulatory Visit (INDEPENDENT_AMBULATORY_CARE_PROVIDER_SITE_OTHER): Payer: BC Managed Care – PPO

## 2019-12-03 DIAGNOSIS — Z7952 Long term (current) use of systemic steroids: Secondary | ICD-10-CM | POA: Diagnosis not present

## 2019-12-03 DIAGNOSIS — J4550 Severe persistent asthma, uncomplicated: Secondary | ICD-10-CM

## 2019-12-03 MED ORDER — OMALIZUMAB 150 MG/ML ~~LOC~~ SOSY
150.0000 mg | PREFILLED_SYRINGE | Freq: Once | SUBCUTANEOUS | Status: AC
  Administered 2019-12-03: 150 mg via SUBCUTANEOUS

## 2019-12-03 NOTE — Progress Notes (Signed)
Have you been hospitalized within the last 10 days?  No Do you have a fever?  No Do you have a cough?  No Do you have a headache or sore throat? No Do you have your Epi Pen visible and is it within date?  Yes 

## 2019-12-10 ENCOUNTER — Telehealth: Payer: Self-pay | Admitting: Pulmonary Disease

## 2019-12-10 NOTE — Telephone Encounter (Signed)
Xolair Prefilled Syringe Order: 150mg  Prefilled Syringe:  #1 75mg  Prefilled Syringe: N/A Ordered Date: 12/10/2019 Expected date of arrival: 12/14/2019 Ordered by: Desmond Dike, [place]  Specialty Pharmacy: [facility]

## 2019-12-13 DIAGNOSIS — J4550 Severe persistent asthma, uncomplicated: Secondary | ICD-10-CM | POA: Diagnosis not present

## 2019-12-14 NOTE — Telephone Encounter (Signed)
Xolair Prefilled Syringe Received:  150mg  Prefilled Syringe >> quantity #1, lot # [id number], exp date 08/2020 75mg  Prefilled Syringe >> N/A Medication arrival date: 12/14/2019 Received by: Desmond Dike, [place]

## 2019-12-31 ENCOUNTER — Ambulatory Visit (INDEPENDENT_AMBULATORY_CARE_PROVIDER_SITE_OTHER): Payer: BC Managed Care – PPO

## 2019-12-31 ENCOUNTER — Other Ambulatory Visit: Payer: Self-pay

## 2019-12-31 DIAGNOSIS — J4550 Severe persistent asthma, uncomplicated: Secondary | ICD-10-CM

## 2019-12-31 DIAGNOSIS — Z7952 Long term (current) use of systemic steroids: Secondary | ICD-10-CM | POA: Diagnosis not present

## 2019-12-31 MED ORDER — OMALIZUMAB 150 MG/ML ~~LOC~~ SOSY
150.0000 mg | PREFILLED_SYRINGE | Freq: Once | SUBCUTANEOUS | Status: AC
  Administered 2019-12-31: 150 mg via SUBCUTANEOUS

## 2019-12-31 NOTE — Progress Notes (Signed)
Have you been hospitalized within the last 10 days?  No Do you have a fever?  No Do you have a cough?  No Do you have a headache or sore throat? No Do you have your Epi Pen visible and is it within date?  Yes 

## 2020-01-12 ENCOUNTER — Telehealth: Payer: Self-pay | Admitting: Pulmonary Disease

## 2020-01-14 DIAGNOSIS — J4550 Severe persistent asthma, uncomplicated: Secondary | ICD-10-CM | POA: Diagnosis not present

## 2020-01-14 NOTE — Telephone Encounter (Signed)
Xolair Prefilled Syringe Order: 150mg  Prefilled Syringe:  #1 75mg  Prefilled Syringe: #n/a Ordered Date: 01/14/20 Expected date of arrival: 01/18/20 Ordered by: [place]: [facility]

## 2020-01-18 NOTE — Telephone Encounter (Signed)
Xolair Prefilled Syringe Received:  150mg  Prefilled Syringe >> quantity #1, lot # [id number], exp date 08/2020 75mg  Prefilled Syringe >> N/A Medication arrival date: 01/18/2020 Received by: Desmond Dike, [place]

## 2020-01-31 ENCOUNTER — Ambulatory Visit (INDEPENDENT_AMBULATORY_CARE_PROVIDER_SITE_OTHER): Payer: BC Managed Care – PPO

## 2020-01-31 ENCOUNTER — Other Ambulatory Visit: Payer: Self-pay

## 2020-01-31 DIAGNOSIS — J4550 Severe persistent asthma, uncomplicated: Secondary | ICD-10-CM | POA: Diagnosis not present

## 2020-01-31 DIAGNOSIS — Z7952 Long term (current) use of systemic steroids: Secondary | ICD-10-CM | POA: Diagnosis not present

## 2020-01-31 MED ORDER — OMALIZUMAB 150 MG/ML ~~LOC~~ SOSY
150.0000 mg | PREFILLED_SYRINGE | Freq: Once | SUBCUTANEOUS | Status: AC
  Administered 2020-01-31: 150 mg via SUBCUTANEOUS

## 2020-01-31 NOTE — Progress Notes (Signed)
All questions were answered by the patient before medication was administered. Have you been hospitalized in the last 10 days? No Do you have a fever? No Do you have a cough? No Do you have a headache or sore throat? No  

## 2020-02-21 ENCOUNTER — Telehealth: Payer: Self-pay | Admitting: Pulmonary Disease

## 2020-02-21 NOTE — Telephone Encounter (Signed)
Xolair Prefilled Syringe Order: 150mg  Prefilled Syringe:  #1 75mg  Prefilled Syringe: #n/a Ordered Date: 02/21/20 Expected date of arrival: insurance is processing  Ordered by: [place]: [employer] is currently Photographer.  Per Shanon Brow, [employer], processing may take 24-48 hours. Follow up will be needed this week to schedule delivery.

## 2020-02-22 ENCOUNTER — Telehealth: Payer: Self-pay | Admitting: Pulmonary Disease

## 2020-02-22 NOTE — Progress Notes (Signed)
[facility] at [facility] [place], [place], [place] [address] [phone]  Date:  02/23/2020   Name:  Amanda [name]   DOB:  12-Oct-1955   MRN:  [id number]  PCP:  Darreld Mclean, MD    Chief Complaint: Knee Pain (left knee pain, no known injury, 2 weeks ago, swelling)   History of Present Illness:  Amanda [name] is a 64 y.o. very pleasant female patient who presents with the following:  Patient with history of asthma, venous insufficiency, allergies, allergies, hypothyroidism, hyperlipidemia Her asthma is managed by pulmonology, Dr. Opal Sidles [place]-unfortunately she has severe persistent asthma with steroid dependence Last seen by myself June 2020 for possible tick bite Here today with concern of knee pain- she has noted increased left knee pain and swelling for about 4 days  No acute injury that she can recall She had a left knee scope about 20 years ago per Dr Lynnell Grain is difficult remember the details, but she thinks she had a cartilage repair at that time Her pain this time seems to be in the back of her knee- at the time of her knee scope it was in the front of her knee  Cologuard up-to-date Mammogram due- order for her  Pap- she declines  COVID-19 vaccine- one dose so far    Patient Active Problem List   Diagnosis Date Noted  . Severe persistent asthma dependent on systemic steroids without complication [id number]  . Chronic venous insufficiency 07/25/2016  . Pain of left leg 03/19/2016  . LPRD (laryngopharyngeal reflux disease) 01/16/2016  . Vitamin D deficiency 06/22/2009  . Backache 09/05/2008  . Overweight 04/13/2008  . Anxiety state 04/13/2008  . Allergic rhinitis 04/13/2008  . Diaphragmatic hernia 04/13/2008  . Osteoarthritis 04/13/2008  . Hypothyroidism 04/12/2008  . Hyperlipidemia 04/12/2008    Past Medical History:  Diagnosis Date  . Allergic rhinitis   . Asthma   . Gastroesophageal  reflux disease with hiatal hernia   . History of shingles   . Hyperlipidemia   . Hypothyroidism   . Osteoarthritis   . Overweight(278.02)   . Vitamin D deficiency     Past Surgical History:  Procedure Laterality Date  . breast reductions  2000   [place]  . left knee arthroscopy  1999   by Dr. Eddie Dibbles  . REDUCTION MAMMAPLASTY      Social History   Tobacco Use  . Smoking status: Never Smoker  . Smokeless tobacco: Never Used  Substance Use Topics  . Alcohol use: Yes    Comment: social use  . Drug use: No    No family history on file.  Allergies  Allergen Reactions  . Azithromycin     zpak does not work for the pt    Medication list has been reviewed and updated.  Current Outpatient Medications on File Prior to Visit  Medication Sig Dispense Refill  . aspirin 81 MG tablet Take 81 mg by mouth daily.     . cetirizine (ZYRTEC) 10 MG tablet Take 10 mg by mouth daily as needed.    . Cholecalciferol 50 MCG (2000 UT) CAPS Take 6,000 Units by mouth.    . Fluticasone-Salmeterol (ADVAIR DISKUS) 500-50 MCG/DOSE AEPB INHALE ONE PUFF BY MOUTH EVERY TWELVE HOURS 60 each 6  . lisinopril (ZESTRIL) 5 MG tablet TAKE 1 TABLET BY MOUTH EVERY DAY 90 tablet 1  . montelukast (SINGULAIR) 10 MG tablet Take 1 tablet (10 mg  total) by mouth at bedtime. 90 tablet 2  . omalizumab (XOLAIR) 150 MG/ML prefilled syringe INJECT 1 SYRINGE UNDER THE SKIN EVERY 4 WEEKS 1 mL 5  . pantoprazole (PROTONIX) 40 MG tablet TAKE 1 TABLET BY MOUTH TWICE A DAY 180 tablet 2  . simvastatin (ZOCOR) 40 MG tablet Take 1 tablet (40 mg total) by mouth at bedtime. 30 tablet 9  . thyroid (ARMOUR THYROID) 60 MG tablet Take 1 tablet (60 mg total) by mouth daily. 30 tablet 9  . albuterol (VENTOLIN HFA) 108 (90 Base) MCG/ACT inhaler Inhale 1-2 puffs into the lungs every 4 (four) hours as needed for wheezing or shortness of breath. 8 g 12  . ranitidine (ZANTAC) 75 MG tablet Take 1 tablet (75 mg total) by mouth at bedtime as  needed. (Patient not taking: Reported on 02/23/2020) 30 tablet 11   Current Facility-Administered Medications on File Prior to Visit  Medication Dose Route Frequency Provider Last Rate Last Admin  . omalizumab Arvid Right) injection 150 mg  150 mg Subcutaneous Q14 Days Margaretha Seeds, MD   150 mg at 10/13/18 1603  . omalizumab Arvid Right) injection 150 mg  150 mg Subcutaneous Q14 Days Margaretha Seeds, MD   150 mg at 11/23/18 1646  . omalizumab Arvid Right) injection 150 mg  150 mg Subcutaneous Q14 Days Margaretha Seeds, MD   150 mg at 04/27/19 [id number]    Review of Systems:  As per HPI- otherwise negative.   Physical Examination: Vitals:   02/23/20 1545  BP: 130/77  Pulse: 66  Resp: 17  Temp: 98.2 F (36.8 C)  SpO2: 100%   Vitals:   02/23/20 1545  Weight: 174 lb (78.9 kg)  Height: 5\' 5"  (1.651 m)   Body mass index is 28.96 kg/m. Ideal Body Weight: Weight in (lb) to have BMI = 25: 149.9  GEN: no acute distress.  Overweight, otherwise appears well HEENT: Atraumatic, Normocephalic.  Ears and Nose: No external deformity. CV: RRR, No M/G/R. No JVD. No thrill. No extra heart sounds. PULM: CTA B, no wheezes, crackles, rhonchi. No retractions. No resp. distress. No accessory muscle use. EXTR: No c/c/e PSYCH: Normally interactive. Conversant.  There is moderate swelling and tenderness in the posterior left knee.  No significant effusion is noted.  Knee seems stable, no redness or heat is present.  Normal pulse in left foot The upper calf is slightly swollen, but not tender   Assessment and Plan: Pain and swelling of left knee - Plan: US Venous Img Lower Unilateral Left  Here today with left knee discomfort.  I suspect she may have a Baker's cyst, versus arthritic changes of the knee.  Also consider DVT.  Ordered an ultrasound for her, she will have this done ASAP.  Assuming she does not have a DVT, she already has an orthopedist in mind who she would like to see She will let me know if I  can offer any further assistance Moderate medical decision making today  This visit occurred during the SARS-CoV-2 public health emergency.  Safety protocols were in place, including screening questions prior to the visit, additional usage of staff PPE, and extensive cleaning of exam room while observing appropriate contact time as indicated for disinfecting solutions.    Signed Lamar Blinks, MD

## 2020-02-23 ENCOUNTER — Ambulatory Visit: Payer: BC Managed Care – PPO | Admitting: Family Medicine

## 2020-02-23 ENCOUNTER — Encounter: Payer: Self-pay | Admitting: Family Medicine

## 2020-02-23 ENCOUNTER — Other Ambulatory Visit: Payer: Self-pay

## 2020-02-23 VITALS — BP 130/77 | HR 66 | Temp 98.2°F | Resp 17 | Ht 65.0 in | Wt 174.0 lb

## 2020-02-23 DIAGNOSIS — M25562 Pain in left knee: Secondary | ICD-10-CM

## 2020-02-23 DIAGNOSIS — M25462 Effusion, left knee: Secondary | ICD-10-CM

## 2020-02-23 NOTE — Patient Instructions (Signed)
Please stop by the imaging dept on the ground floor and set up your left leg ultrasound for the next couple of days.  Assuming no blood clot, I would recommend that you schedule with your preferred orthopedist. Let me know if you need any help or a referral

## 2020-02-23 NOTE — Telephone Encounter (Signed)
Xolair Prefilled Syringe Order: 150mg  Prefilled Syringe:  #1 75mg  Prefilled Syringe: #0 Ordered Date: 02/23/2020 Expected date of arrival: 02/29/2020 Ordered by: Desmond Dike, [place]  Specialty Pharmacy: [facility]

## 2020-02-23 NOTE — Telephone Encounter (Signed)
See telephone encounter (02/21/2020)

## 2020-02-25 ENCOUNTER — Other Ambulatory Visit: Payer: Self-pay

## 2020-02-25 ENCOUNTER — Ambulatory Visit (HOSPITAL_BASED_OUTPATIENT_CLINIC_OR_DEPARTMENT_OTHER)
Admission: RE | Admit: 2020-02-25 | Discharge: 2020-02-25 | Disposition: A | Discharge status: Home / Self Care | Payer: BC Managed Care – PPO | Source: Ambulatory Visit | Attending: Family Medicine | Admitting: Family Medicine

## 2020-02-25 DIAGNOSIS — M25462 Effusion, left knee: Secondary | ICD-10-CM | POA: Diagnosis not present

## 2020-02-25 DIAGNOSIS — M25562 Pain in left knee: Secondary | ICD-10-CM | POA: Diagnosis not present

## 2020-02-25 DIAGNOSIS — R600 Localized edema: Secondary | ICD-10-CM | POA: Diagnosis not present

## 2020-02-25 DIAGNOSIS — M79662 Pain in left lower leg: Secondary | ICD-10-CM | POA: Diagnosis not present

## 2020-02-28 DIAGNOSIS — J4550 Severe persistent asthma, uncomplicated: Secondary | ICD-10-CM | POA: Diagnosis not present

## 2020-02-29 NOTE — Telephone Encounter (Signed)
Xolair Prefilled Syringe Received:  150mg  Prefilled Syringe >> quantity #1, lot # [id number], exp date 12/2020 75mg  Prefilled Syringe >> N/A Medication arrival date: 02/29/2020 Received by: Desmond Dike, [place]

## 2020-03-02 ENCOUNTER — Other Ambulatory Visit: Payer: Self-pay

## 2020-03-02 ENCOUNTER — Ambulatory Visit (INDEPENDENT_AMBULATORY_CARE_PROVIDER_SITE_OTHER): Payer: BC Managed Care – PPO

## 2020-03-02 DIAGNOSIS — J4550 Severe persistent asthma, uncomplicated: Secondary | ICD-10-CM

## 2020-03-02 DIAGNOSIS — Z7952 Long term (current) use of systemic steroids: Secondary | ICD-10-CM

## 2020-03-02 MED ORDER — OMALIZUMAB 150 MG/ML ~~LOC~~ SOSY
150.0000 mg | PREFILLED_SYRINGE | Freq: Once | SUBCUTANEOUS | Status: AC
  Administered 2020-03-02: 150 mg via SUBCUTANEOUS

## 2020-03-02 NOTE — Progress Notes (Signed)
All questions were answered by the patient before medication was administered. Have you been hospitalized in the last 10 days? No Do you have a fever? No Do you have a cough? No Do you have a headache or sore throat? No  

## 2020-03-20 ENCOUNTER — Telehealth: Payer: Self-pay | Admitting: Pulmonary Disease

## 2020-03-20 NOTE — Telephone Encounter (Signed)
Xolair Prefilled Syringe Order: 150mg  Prefilled Syringe:  #1 75mg  Prefilled Syringe: #0 Ordered Date: 03/20/2020 Expected date of arrival: insurance pending Ordered by: Desmond Dike, [place]  Specialty Pharmacy: [place] is currently processing [place]. Processing may take 24-48 hours. Will follow up on shipment

## 2020-03-23 NOTE — Telephone Encounter (Signed)
Called [facility] to follow up on pt's shipment. Was advised that her prescription is still pending insurance. Will continue to follow up.

## 2020-03-29 NOTE — Telephone Encounter (Signed)
Called [facility] to follow up on pt's shipment. Was advised that her prescription is still pending insurance. Will continue to follow up.  Called pt and her appointment has been canceled for tomorrow since her medication will not be here. Advised pt that we would call her when her medication arrives to reschedule her appointment.

## 2020-03-30 ENCOUNTER — Ambulatory Visit: Payer: BC Managed Care – PPO

## 2020-04-03 NOTE — Telephone Encounter (Signed)
Xolair Prefilled Syringe Order: 150mg  Prefilled Syringe:  #1 75mg  Prefilled Syringe: #0 Ordered Date: 04/03/2020 Expected date of arrival: 04/05/2020 Ordered by: Desmond Dike, [place]  Specialty Pharmacy: [place] with pt. Her appointment has been scheduled for 04/06/2020 at 0900.

## 2020-04-04 DIAGNOSIS — J4550 Severe persistent asthma, uncomplicated: Secondary | ICD-10-CM | POA: Diagnosis not present

## 2020-04-05 NOTE — Telephone Encounter (Signed)
Xolair Prefilled Syringe Received:  150mg  Prefilled Syringe >> quantity #1, lot # [id number], exp date 01/2021 75mg  Prefilled Syringe >> N/A Medication arrival date: 04/05/2020 Received by: Desmond Dike, [place]

## 2020-04-06 ENCOUNTER — Ambulatory Visit (INDEPENDENT_AMBULATORY_CARE_PROVIDER_SITE_OTHER): Payer: BC Managed Care – PPO

## 2020-04-06 ENCOUNTER — Other Ambulatory Visit: Payer: Self-pay

## 2020-04-06 DIAGNOSIS — Z7952 Long term (current) use of systemic steroids: Secondary | ICD-10-CM

## 2020-04-06 DIAGNOSIS — J4550 Severe persistent asthma, uncomplicated: Secondary | ICD-10-CM | POA: Diagnosis not present

## 2020-04-06 MED ORDER — OMALIZUMAB 150 MG/ML ~~LOC~~ SOSY
150.0000 mg | PREFILLED_SYRINGE | Freq: Once | SUBCUTANEOUS | Status: AC
  Administered 2020-04-06: 150 mg via SUBCUTANEOUS

## 2020-04-06 NOTE — Progress Notes (Signed)
All questions were answered by the patient before medication was administered. Have you been hospitalized in the last 10 days? No Do you have a fever? No Do you have a cough? No Do you have a headache or sore throat? No  

## 2020-04-24 ENCOUNTER — Telehealth: Payer: Self-pay | Admitting: Pulmonary Disease

## 2020-04-24 NOTE — Telephone Encounter (Signed)
Waiting for Patient to give consent to [facility].  Will follow up with shipment arrival date.

## 2020-04-24 NOTE — Telephone Encounter (Signed)
Xolair Prefilled Syringe Order: 150mg  Prefilled Syringe:  #1 75mg  Prefilled Syringe: #N/A Ordered Date: 04/24/20 Expected date of arrival: 7/ Ordered by: [place]: [facility]

## 2020-04-24 NOTE — Telephone Encounter (Signed)
At this time Patient has not given consent to ship Xolair.  Will continue to follow up.

## 2020-04-25 NOTE — Telephone Encounter (Signed)
[facility].  Patient gave consent to ship Xolair 150mg . Xolair is currently being processed through major medical insurance. Per Anderson Malta at [place], it may take 24-48 hours to process.

## 2020-04-26 NOTE — Telephone Encounter (Signed)
Called [facility] to follow up on [place] shipment.  Xolair is still under insurance review with [employer]. I was advised to follow up 04/28/20.

## 2020-04-28 NOTE — Telephone Encounter (Signed)
Called [facility] to follow up on [place] shipment.  Xolair 150mg  shipment scheduled 05/02/20.

## 2020-04-30 ENCOUNTER — Other Ambulatory Visit: Payer: Self-pay | Admitting: Family Medicine

## 2020-05-01 DIAGNOSIS — J4550 Severe persistent asthma, uncomplicated: Secondary | ICD-10-CM | POA: Diagnosis not present

## 2020-05-02 NOTE — Telephone Encounter (Signed)
Xolair Prefilled Syringe Received:  150mg  Prefilled Syringe >> quantity #1, lot # [id number], exp date 04/05/2021 75mg  Prefilled Syringe >> quantity #N/A Medication arrival date: 05/02/20 Received by: Roselina Burgueno,LPN

## 2020-05-04 ENCOUNTER — Ambulatory Visit (INDEPENDENT_AMBULATORY_CARE_PROVIDER_SITE_OTHER): Payer: BC Managed Care – PPO

## 2020-05-04 ENCOUNTER — Other Ambulatory Visit: Payer: Self-pay

## 2020-05-04 DIAGNOSIS — Z7952 Long term (current) use of systemic steroids: Secondary | ICD-10-CM | POA: Diagnosis not present

## 2020-05-04 DIAGNOSIS — J4550 Severe persistent asthma, uncomplicated: Secondary | ICD-10-CM | POA: Diagnosis not present

## 2020-05-04 MED ORDER — OMALIZUMAB 150 MG/ML ~~LOC~~ SOSY
150.0000 mg | PREFILLED_SYRINGE | Freq: Once | SUBCUTANEOUS | Status: AC
  Administered 2020-05-04: 150 mg via SUBCUTANEOUS

## 2020-05-04 NOTE — Progress Notes (Signed)
All questions were answered by the patient before medication was administered. Have you been hospitalized in the last 10 days? No Do you have a fever? No Do you have a cough? No Do you have a headache or sore throat? No  

## 2020-05-05 ENCOUNTER — Encounter: Payer: Self-pay | Admitting: Pulmonary Disease

## 2020-05-17 ENCOUNTER — Other Ambulatory Visit: Payer: Self-pay | Admitting: Family Medicine

## 2020-05-22 ENCOUNTER — Telehealth: Payer: Self-pay | Admitting: Pulmonary Disease

## 2020-05-22 NOTE — Telephone Encounter (Signed)
[facility].  Xolair 150mg  every 4 weeks, is currently being processed through [employer] medical.  Will follow up later this week.

## 2020-05-24 NOTE — Telephone Encounter (Signed)
[facility]ed [facility] to check on [place], spoke with [place].   Per Sindy [place] is still being processed through [employer]. Will continue to follow up.

## 2020-05-26 NOTE — Telephone Encounter (Signed)
Called [facility].  Per Representative Xolair is ready to ship, but [facility] is needing Patient consent, and review insurance information. Called and spoke with Patient.  Patient aware [facility] is needing to speak with her.  [facility] contact number given.

## 2020-06-01 ENCOUNTER — Other Ambulatory Visit: Payer: Self-pay

## 2020-06-01 ENCOUNTER — Ambulatory Visit (INDEPENDENT_AMBULATORY_CARE_PROVIDER_SITE_OTHER): Payer: BC Managed Care – PPO

## 2020-06-01 DIAGNOSIS — J4550 Severe persistent asthma, uncomplicated: Secondary | ICD-10-CM | POA: Diagnosis not present

## 2020-06-01 DIAGNOSIS — Z7952 Long term (current) use of systemic steroids: Secondary | ICD-10-CM | POA: Diagnosis not present

## 2020-06-01 MED ORDER — OMALIZUMAB 150 MG/ML ~~LOC~~ SOSY
150.0000 mg | PREFILLED_SYRINGE | Freq: Once | SUBCUTANEOUS | Status: AC
  Administered 2020-06-01: 150 mg via SUBCUTANEOUS

## 2020-06-01 NOTE — Progress Notes (Signed)
Have you been hospitalized within the last 10 days?  No Do you have a fever?  No Do you have a cough?  No Do you have a headache or sore throat? No Do you have your Epi Pen visible and is it within date?  Yes 

## 2020-06-11 ENCOUNTER — Other Ambulatory Visit: Payer: Self-pay | Admitting: Family Medicine

## 2020-06-13 ENCOUNTER — Telehealth: Payer: Self-pay | Admitting: Pulmonary Disease

## 2020-06-13 ENCOUNTER — Other Ambulatory Visit: Payer: Self-pay | Admitting: Pulmonary Disease

## 2020-06-13 NOTE — Telephone Encounter (Signed)
Called and spoke with Patient.  I had called [facility] to scheduled Xolair shipment to office, and I was told [facility] needed to speak with Patient to get consent to ship, and discuss co pay.  Patient is aware, and will contact [facility]. I will follow up  later this week to schedule Xolair shipment.

## 2020-06-14 NOTE — Telephone Encounter (Signed)
Called [facility] to set up Patient's Xolair shipment.  Per [facility] representative,Patient has not called [facility].  Per representative, Patient needs to discuss insurance and co pay. Will continue to follow up.

## 2020-06-16 NOTE — Telephone Encounter (Signed)
Called and spoke with [facility] to set up [place] shipment.  Hassan Rowan, [facility] representative stated they have been unable to speak with Patient.  Hassan Rowan stated [facility] needs to discuss Patient co pay responsibility. I have ATC Patient, but I am unable to leave message on VM.

## 2020-06-17 NOTE — Telephone Encounter (Signed)
Called and spoke with [facility], representative [place].  Hassan Rowan stated Patient has not returned calls to [facility], and until [facility] speaks to Patient, Arvid Right can not be shipped to office. Hassan Rowan stated Patient insurance is showing high co pay, and they need to discuss with Patient. Hassan Rowan stated earliest shipment date would be 9/13/21or 06/20/20, if Patient contacts [facility]. Hassan Rowan stated once they receive information from Patient someone from [place] will contact office to schedule delivery. Called and spoke with Patient.  Patient stated she has called [facility] this week, and spoke with someone. Patient stated she tried calling yesterday, twice, and was unable to speak with someone. Patient stated she has a $5 copay for Xolair, and she has had no changes in insurance. Advised Patient to contact [facility], because Xolair will not be shipped until she speaks with [facility] about co pay. Patient stated she was going to call [facility] to discuss co pay.

## 2020-06-18 IMAGING — US US EXTREM LOW VENOUS*L*
1 series · 14 of 24 positions shown · non-contrast
Comparison: None.

CLINICAL DATA: Calf pain, edema

EXAM:
LEFT LOWER EXTREMITY VENOUS DOPPLER ULTRASOUND
TECHNIQUE: Gray-scale sonography with compression, as well as color and duplex
ultrasound, were performed to evaluate the deep venous system(s)
from the level of the common femoral vein through the popliteal and
proximal calf veins.

[Series 1: us extrem low venous*left* · 14 of 30 slices shown]
[im 1/30]
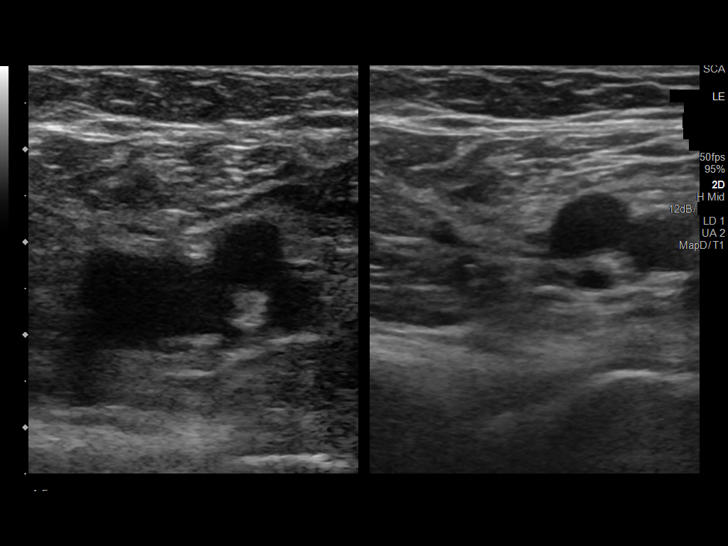
[im 3/30]
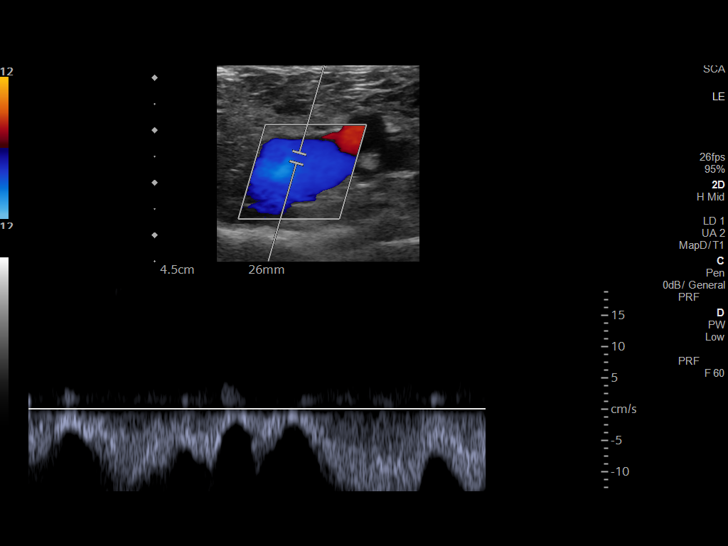
[im 6/30]
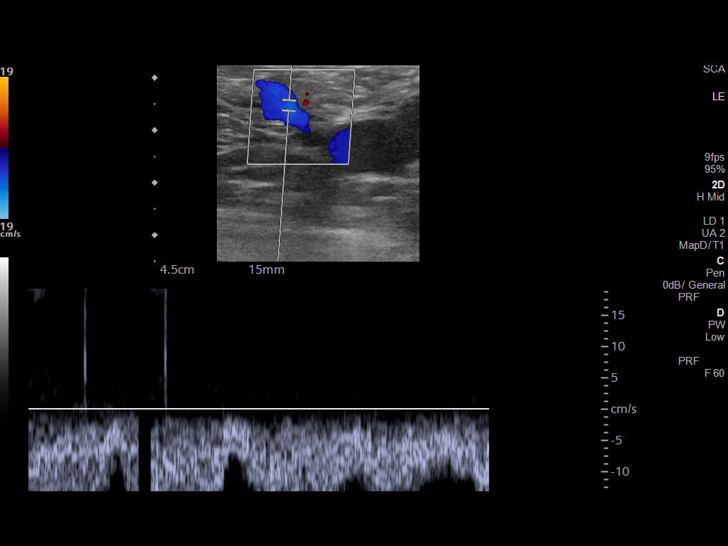
[im 8/30]
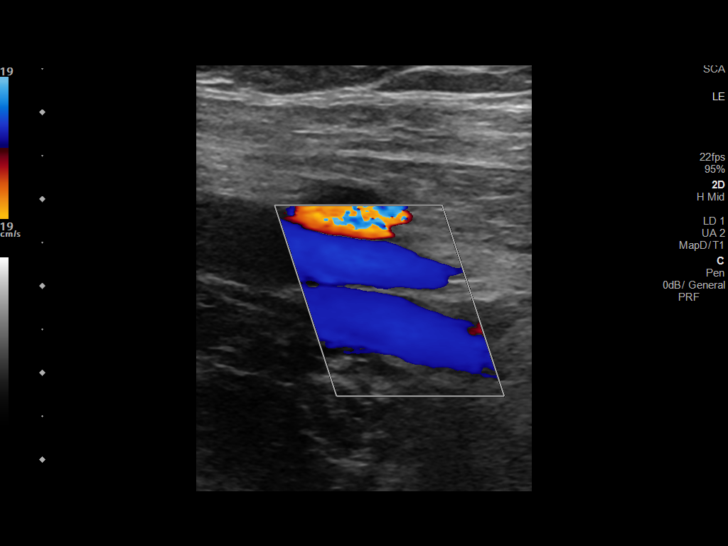
[im 9/30]
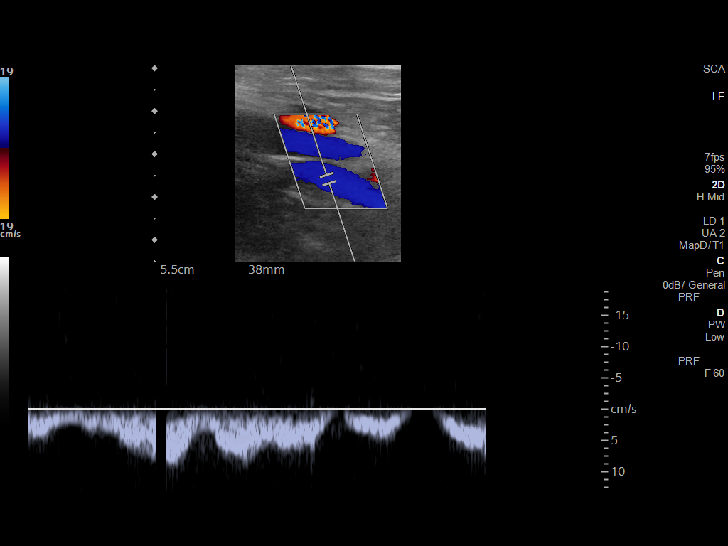
[im 12/30]
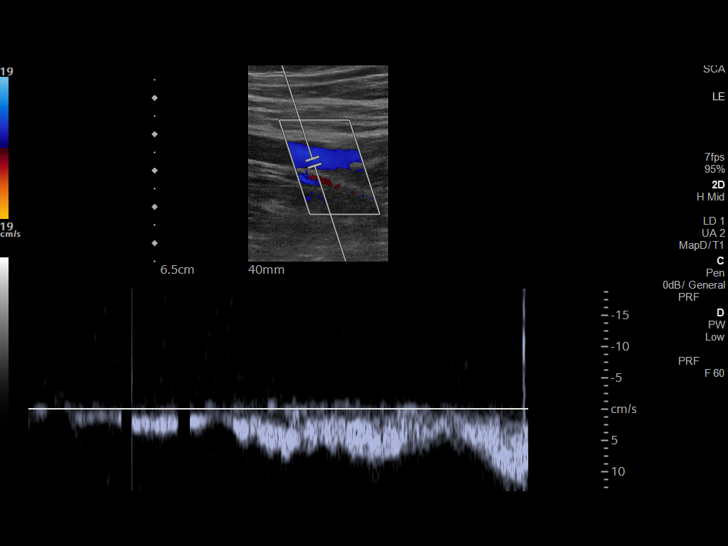
[im 14/30]
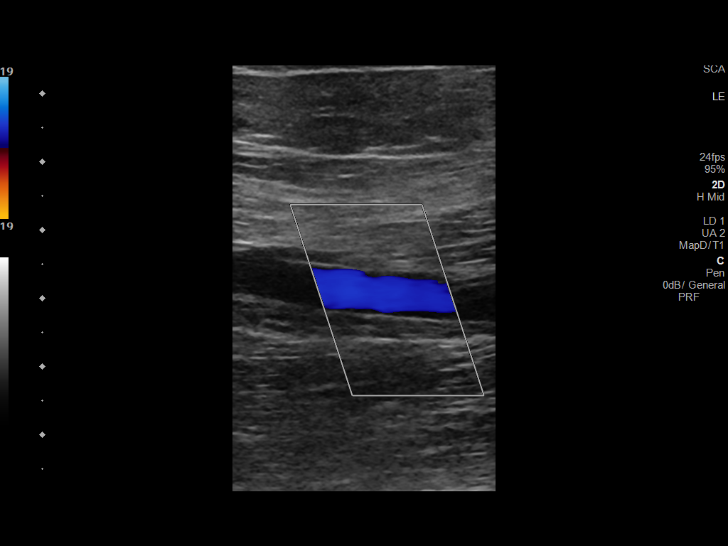
[im 16/30]
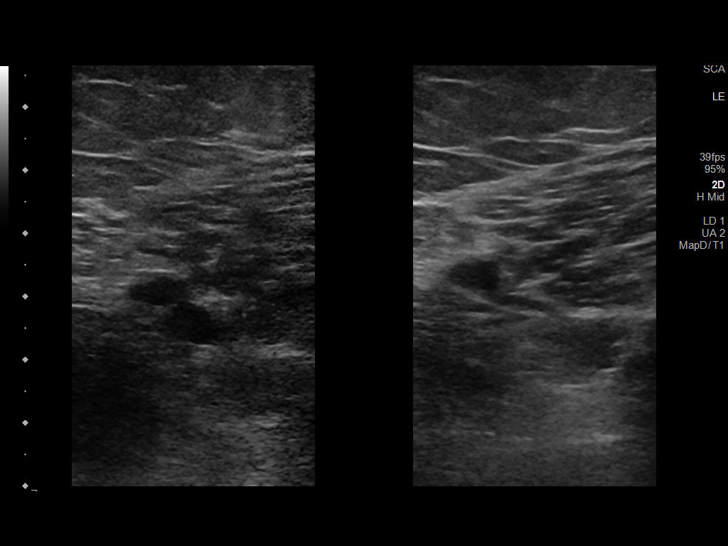
[im 18/30]
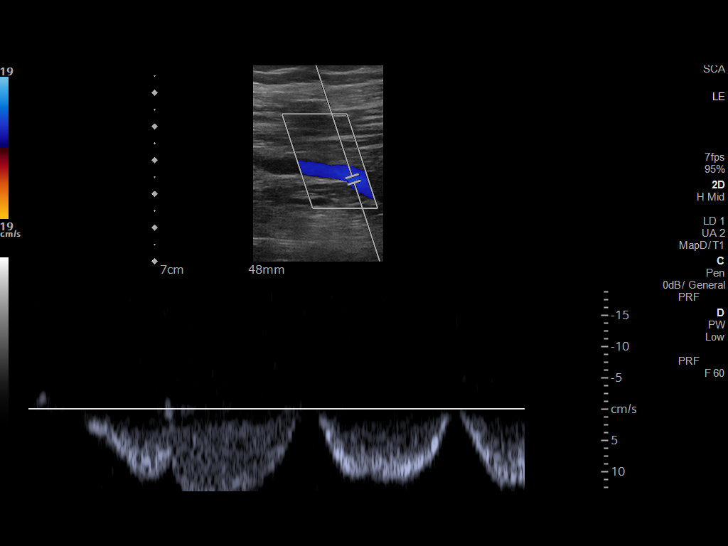
[im 21/30]
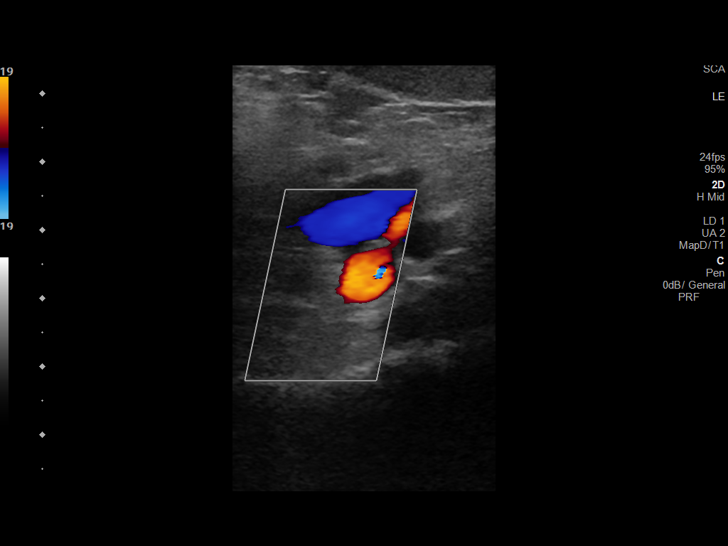
[im 23/30]
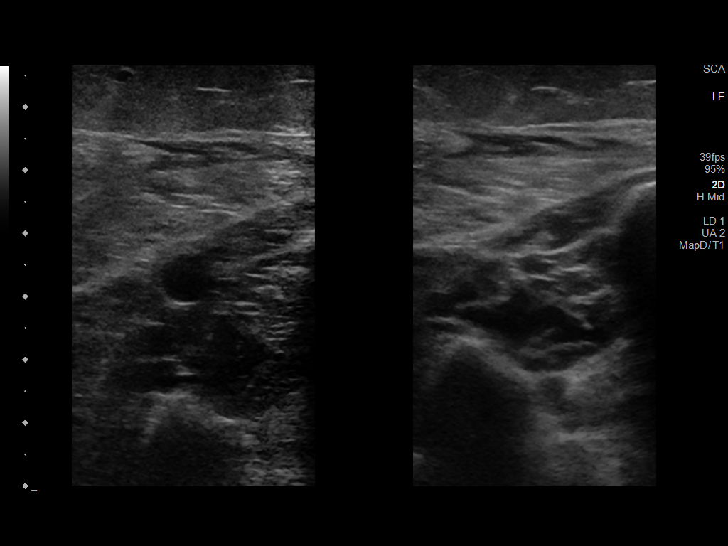
[im 24/30]
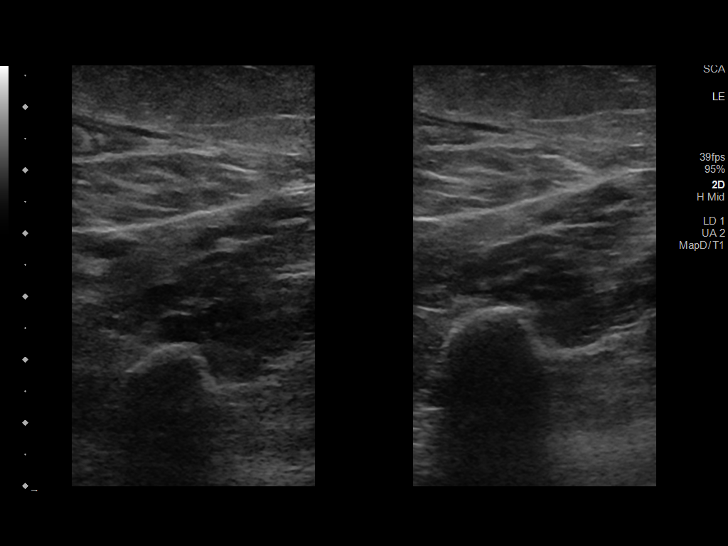
[im 27/30]
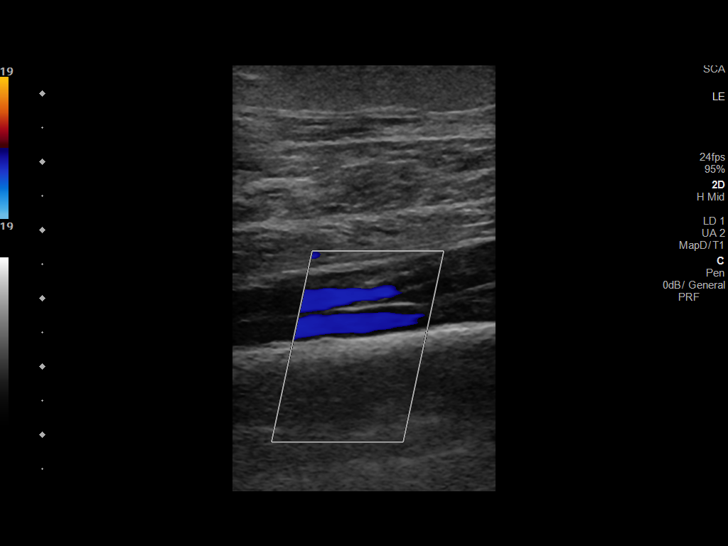
[im 30/30]
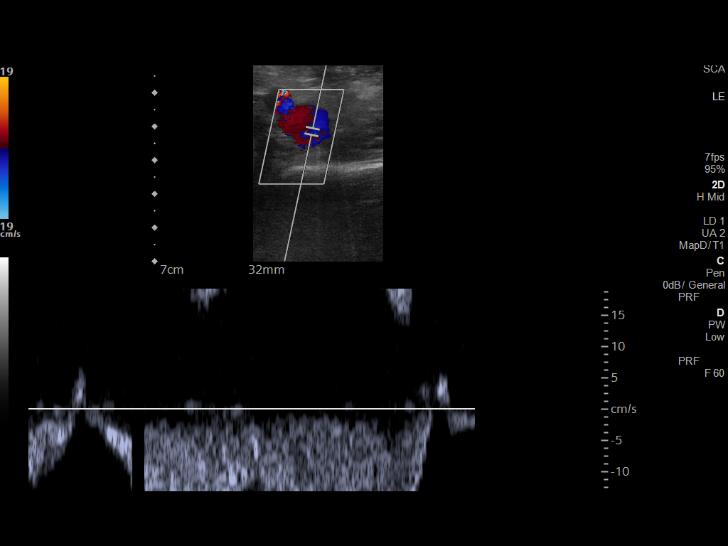

[14 of 24 positions shown; findings below may reference images not displayed]

FINDINGS: VENOUS

Normal compressibility of the common femoral, superficial femoral,
and popliteal veins, as well as the visualized calf veins.
Visualized portions of profunda femoral vein and great saphenous
vein unremarkable. No filling defects to suggest DVT on grayscale or
color Doppler imaging. Doppler waveforms show normal direction of
venous flow, normal respiratory phasicity and response to
augmentation.

Limited views of the contralateral common femoral vein are
unremarkable.

OTHER

None.

Limitations: none
IMPRESSION: No femoropopliteal DVT nor evidence of DVT within the visualized
calf veins.

If clinical symptoms are inconsistent or if there are persistent or
worsening symptoms, further imaging (possibly involving the iliac
veins) may be warranted.

## 2020-06-20 NOTE — Telephone Encounter (Signed)
Xolair Prefilled Syringe Order: 150mg  Prefilled Syringe:  #1 75mg  Prefilled Syringe: #N/A Ordered Date: 06/20/20 Expected date of arrival: 06/23/20 Ordered by: [place]: [facility]

## 2020-06-22 DIAGNOSIS — J4550 Severe persistent asthma, uncomplicated: Secondary | ICD-10-CM | POA: Diagnosis not present

## 2020-06-23 NOTE — Telephone Encounter (Signed)
Xolair Prefilled Syringe Received:  150mg  Prefilled Syringe >> quantity #1, lot # [id number], exp date 04/05/2021 75mg  Prefilled Syringe >> quantity #N/A Medication arrival date: 06/23/20 Received by: Anuja Manka,LPN

## 2020-06-29 ENCOUNTER — Ambulatory Visit (INDEPENDENT_AMBULATORY_CARE_PROVIDER_SITE_OTHER): Payer: BC Managed Care – PPO

## 2020-06-29 ENCOUNTER — Other Ambulatory Visit: Payer: Self-pay

## 2020-06-29 DIAGNOSIS — J4550 Severe persistent asthma, uncomplicated: Secondary | ICD-10-CM | POA: Diagnosis not present

## 2020-06-29 DIAGNOSIS — Z7952 Long term (current) use of systemic steroids: Secondary | ICD-10-CM | POA: Diagnosis not present

## 2020-06-29 MED ORDER — OMALIZUMAB 150 MG/ML ~~LOC~~ SOSY
150.0000 mg | PREFILLED_SYRINGE | Freq: Once | SUBCUTANEOUS | Status: AC
  Administered 2020-06-29: 150 mg via SUBCUTANEOUS

## 2020-06-29 NOTE — Progress Notes (Signed)
Have you been hospitalized within the last 10 days?  No Do you have a fever?  No Do you have a cough?  No Do you have a headache or sore throat? No Do you have your Epi Pen visible and is it within date?  Yes 

## 2020-07-07 ENCOUNTER — Ambulatory Visit: Payer: BC Managed Care – PPO | Admitting: Pulmonary Disease

## 2020-07-10 DIAGNOSIS — M5136 Other intervertebral disc degeneration, lumbar region: Secondary | ICD-10-CM | POA: Diagnosis not present

## 2020-07-10 DIAGNOSIS — M7062 Trochanteric bursitis, left hip: Secondary | ICD-10-CM | POA: Diagnosis not present

## 2020-07-12 ENCOUNTER — Other Ambulatory Visit: Payer: Self-pay | Admitting: Pulmonary Disease

## 2020-07-17 ENCOUNTER — Telehealth: Payer: Self-pay | Admitting: Pulmonary Disease

## 2020-07-17 NOTE — Telephone Encounter (Signed)
Called [facility] to schedule office delivery for Xolair.  At this time, Xolair is in medical review at this time. Appointment date given for delivery.

## 2020-07-18 NOTE — Telephone Encounter (Signed)
Called [facility] to follow up on [place] shipment. Xolair is in medical review.  Delivery per Representative should be delivered at office before next injection visit.

## 2020-07-20 ENCOUNTER — Ambulatory Visit: Payer: BC Managed Care – PPO | Admitting: Pulmonary Disease

## 2020-07-20 ENCOUNTER — Other Ambulatory Visit: Payer: Self-pay

## 2020-07-20 ENCOUNTER — Encounter: Payer: Self-pay | Admitting: Pulmonary Disease

## 2020-07-20 ENCOUNTER — Telehealth: Payer: Self-pay | Admitting: Pharmacy Technician

## 2020-07-20 VITALS — BP 120/68 | HR 77 | Temp 97.5°F | Ht 65.0 in | Wt 177.8 lb

## 2020-07-20 DIAGNOSIS — J4550 Severe persistent asthma, uncomplicated: Secondary | ICD-10-CM | POA: Diagnosis not present

## 2020-07-20 DIAGNOSIS — Z7952 Long term (current) use of systemic steroids: Secondary | ICD-10-CM | POA: Diagnosis not present

## 2020-07-20 MED ORDER — MONTELUKAST SODIUM 10 MG PO TABS: 10.0000 mg | ORAL_TABLET | Freq: Every day | ORAL | 2 refills | Status: DC

## 2020-07-20 NOTE — Progress Notes (Signed)
Subjective:   PATIENT ID: Amanda [name] GENDER: female DOB: [birth year], MRN: [id number]   HPI  Chief Complaint  Patient presents with  . Follow-up    increased out of breath different from her Asthma out of breath    Reason for Visit: Follow-up asthma  [name] is a 64 year old female never smoker with severe persistent asthma and seasonal allergic rhinitis who presents for follow-up.  Interval She reports her asthma well-controlled with Xolair and daily Advair 500-50 mcg daily. She uses albuterol 2-4 times a week. Denies any exacerbations. This last weekend she did have some wheezing and shortness of breath that occurred after exertion when performing yardwork.  She required an additional dose of Advair but has recovered since then.  Her symptoms are a little worse in the fall and spring but usually stay the same throughout the year.  She has noticed that she has had slightly more shortness of breath than usual but this is not limiting her regular activities.  Denies wheezing or cough.  Symptoms are triggered by exertion, illness, strong odors and allergies. Denies nocturnal symptoms.   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  Past Medical History:  Diagnosis Date  . Allergic rhinitis   . Asthma   . Gastroesophageal reflux disease with hiatal hernia   . History of shingles   . Hyperlipidemia   . Hypothyroidism   . Osteoarthritis   . Overweight(278.02)   . Vitamin D deficiency     Allergies  Allergen Reactions  . Azithromycin     zpak does not work for the pt     Outpatient Medications Prior to Visit  Medication Sig Dispense Refill  . albuterol (VENTOLIN HFA) 108 (90 Base) MCG/ACT inhaler Inhale 1-2 puffs into the lungs every 4 (four) hours as needed for wheezing or shortness of breath. 8 g  12  . ARMOUR THYROID 60 MG tablet TAKE 1 TABLET BY MOUTH EVERY DAY 90 tablet 3  . aspirin 81 MG tablet Take 81 mg by mouth daily.     . cetirizine (ZYRTEC) 10 MG tablet Take 10 mg by mouth daily as needed.    . Cholecalciferol 50 MCG (2000 UT) CAPS Take 6,000 Units by mouth.    . Fluticasone-Salmeterol (ADVAIR DISKUS) 500-50 MCG/DOSE AEPB INHALE 1 PUFF INTO THE LUNGS EVERY 12 HOURS 180 each 0  . lisinopril (ZESTRIL) 5 MG tablet TAKE 1 TABLET BY MOUTH EVERY DAY 90 tablet 1  . pantoprazole (PROTONIX) 40 MG tablet TAKE 1 TABLET BY MOUTH TWICE A DAY 180 tablet 2  . ranitidine (ZANTAC) 75 MG tablet Take 1 tablet (75 mg total) by mouth at bedtime as needed. 30 tablet 11  . simvastatin (ZOCOR) 40 MG tablet TAKE 1 TABLET BY MOUTH EVERYDAY AT BEDTIME 90 tablet 3  . XOLAIR 150 MG/ML prefilled syringe INJECT 1 SYRINGE UNDER THE SKIN (SUBCUTANEOUS INJECTION) EVERY 4 WEEKS 1 mL 5  . montelukast (SINGULAIR) 10 MG tablet Take 1 tablet (10 mg total) by mouth at bedtime. 90 tablet 2   Facility-Administered Medications Prior to Visit  Medication Dose Route Frequency Provider Last Rate Last Admin  . omalizumab Arvid Right) injection 150 mg  150 mg Subcutaneous Q14 Days Margaretha Seeds, MD   150 mg at 10/13/18 1603  . omalizumab Arvid Right) injection 150 mg  150 mg Subcutaneous Q14 Days Margaretha Seeds, MD   150  mg at 11/23/18 1646  . omalizumab Arvid Right) injection 150 mg  150 mg Subcutaneous Q14 Days Margaretha Seeds, MD   150 mg at 04/27/19 5537    Review of Systems  Constitutional: Negative for chills, diaphoresis, fever, malaise/fatigue and weight loss.  HENT: Negative for congestion.   Respiratory: Positive for shortness of breath. Negative for cough, hemoptysis, sputum production and wheezing.   Cardiovascular: Negative for chest pain, palpitations and leg swelling.   Objective:   Vitals:   07/20/20 0930  BP: 120/68  Pulse: 77  Temp: (!) 97.5 F (36.4 C)  TempSrc: Temporal  SpO2: 98%  Weight: 177  lb 12.8 oz (80.6 kg)  Height: 5\' 5"  (1.651 m)   SpO2: 98 % O2 Device: None (Room air)  Physical Exam: General: Well-appearing, no acute distress HENT: [place], AT Eyes: EOMI, no scleral icterus Respiratory: Clear to auscultation bilaterally.  No crackles, wheezing or rales Cardiovascular: RRR, -M/R/G, no JVD Extremities:-Edema,-tenderness Neuro: AAO x4, CNII-XII grossly intact Skin: Intact, no rashes or bruising Psych: Normal mood, normal affect  Data Reviewed:  Imaging: CXR 01/27/17 - no acute cardiopulmonary abnormalities. No infiltrate, effusion or edema  PFT: 01/16/16 - Ratio 58 FEV1 43% FVC 58% Interpretation: Severe obstructive defect  Labs: CBC with diff 08/06/18 - WBC 8.5 with Eos 5.3% (400 absolute eos)  Imaging, labs and tests noted above have been reviewed independently by me.    Assessment & Plan:   Discussion:  64 year old female never smoker with severe persistent asthma and seasonal allergic rhinitis who presents for follow-up of severe persistent asthma. Her last exacerbation was in 2018.  No active signs of exacerbation however does report worsening shortness of breath.  We discussed the benefits of increasing inhaler use for short period of time and stepping down once improved.  We also discussed that she may be a good candidate for at home administration.  She would be interested as long as the cost is unchanged.  I will send a message to pharmacy team and our biologic coordinator.  Severe persistent asthma --CONTINUE Xolair as scheduled. We will investigate the cost to switch from [facility] to [place]. --INCREASE Advair 500-50 mcg twice daily. Can step down to once daily when symptoms improve --CONTINUE Albuterol inhaler as needed --REFILL Singulair 10 mg daily  Asthma Action Plan For more symptomatic days, use Advair inhaler twice daily.  Please call office if symptoms such as shortness of breath, wheezing or cough do not improve  after inhaler use.  Health Maintenance Immunization History  Administered Date(s) Administered  . PFIZER SARS-COV-2 Vaccination 01/14/2020, 04/14/2020  . Tdap 01/21/2011  Declined influenza vaccine.  No orders of the defined types were placed in this encounter.  Meds ordered this encounter  Medications  . montelukast (SINGULAIR) 10 MG tablet    Sig: Take 1 tablet (10 mg total) by mouth at bedtime.    Dispense:  90 tablet    Refill:  2   Return in about 6 months (around 01/18/2021).  I have spent a total time of 32-minutes on the day of the appointment reviewing prior documentation, coordinating care and discussing medical diagnosis and plan with the patient/family. Imaging, labs and tests included in this note have been reviewed and interpreted independently by me.  [place], MD [facility] 07/20/2020 2:19 PM  Office Number [phone]

## 2020-07-20 NOTE — Telephone Encounter (Signed)
-----   Message from [place], MD sent at 07/20/2020  2:23 PM EDT ----- Amanda [name] and Amanda [name],Patient would be interested in considering home administration of Xolair only if cost is unchanged when she switches from [employer] to [employer]. Currently [facility] customer service is difficult to deal with and she would rather switch to [facility] first.Can you help me facilitate this change? And then afterwards, schedule her to discuss home administration?Thank Tonia Brooms, MD

## 2020-07-20 NOTE — Telephone Encounter (Signed)
Submitted a Prior Authorization request to [employer] for [employer] via Cover My Meds. Will update once we receive a response.   (Key: [id number])

## 2020-07-20 NOTE — Patient Instructions (Addendum)
Severe persistent asthma --CONTINUE Xolair as scheduled. We will investigate the cost to switch from [facility] to [place]. --INCREASE Advair 500-50 mcg twice daily. Can step down to once daily when symptoms improve --CONTINUE Albuterol inhaler as needed --REFILL Singulair 10 mg daily  Asthma Action Plan For more symptomatic days, use Advair inhaler twice daily.  Please call office if symptoms such as shortness of breath, wheezing or cough do not improve after inhaler use.

## 2020-07-20 NOTE — Telephone Encounter (Signed)
Will initiate BIV for home administration and see if patient can fill through [facility]

## 2020-07-25 ENCOUNTER — Other Ambulatory Visit: Payer: Self-pay | Admitting: Pulmonary Disease

## 2020-07-25 MED ORDER — OMALIZUMAB 150 MG/ML ~~LOC~~ SOSY: 150.0000 mg | PREFILLED_SYRINGE | SUBCUTANEOUS | 11 refills | Status: DC

## 2020-07-25 NOTE — Telephone Encounter (Signed)
Xolair prescription sent to [facility]. Patient is scheduled 07/27/20 for self/home teaching.

## 2020-07-25 NOTE — Telephone Encounter (Signed)
Received notification from [facility] regarding a prior authorization for XOLAIR 150mg  Syringes. Authorization has been APPROVED from 07/20/20 to 07/19/21.   Authorization # [id number]  Ran test through [facility] and claim paid. Patient will need to provide pharmacy with her copay card information.  Patient can be trained for self- injections. Please send prescription to [facility].

## 2020-07-27 ENCOUNTER — Ambulatory Visit (INDEPENDENT_AMBULATORY_CARE_PROVIDER_SITE_OTHER): Payer: BC Managed Care – PPO

## 2020-07-27 ENCOUNTER — Other Ambulatory Visit: Payer: Self-pay

## 2020-07-27 ENCOUNTER — Telehealth: Payer: Self-pay | Admitting: Pulmonary Disease

## 2020-07-27 DIAGNOSIS — Z7952 Long term (current) use of systemic steroids: Secondary | ICD-10-CM

## 2020-07-27 DIAGNOSIS — J4550 Severe persistent asthma, uncomplicated: Secondary | ICD-10-CM | POA: Diagnosis not present

## 2020-07-27 MED ORDER — OMALIZUMAB 150 MG/ML ~~LOC~~ SOSY
150.0000 mg | PREFILLED_SYRINGE | Freq: Once | SUBCUTANEOUS | Status: AC
  Administered 2020-07-27: 150 mg via SUBCUTANEOUS

## 2020-07-27 NOTE — Telephone Encounter (Signed)
Patient came in office today for Xolair injection.   [facility] outpatient pharmacy information given. Patient is concerned that she may have to pay for Xolair, if she goes through [employer].  Patient stated she gets assistance and has never paid more then $5, and she can not afford a huge bill.  Patient stated she doesn't have anything stating she gets assistance to show [facility], and is concerned that would mess with assistance.   Message routed to Pharmacy team to advise

## 2020-07-27 NOTE — Telephone Encounter (Signed)
Called Xolair copay card company and received patient's info. Provided to pharmacy who received same $5.00 copay. Called patient to advise. She states she has not trained to self- inject yet.  BIN- [id number]

## 2020-07-27 NOTE — Progress Notes (Signed)
Have you been hospitalized within the last 10 days?  No Do you have a fever?  No Do you have a cough?  No Do you have a headache or sore throat? No Do you have your Epi Pen visible and is it within date?  Yes 

## 2020-07-27 NOTE — Telephone Encounter (Signed)
Patient did not want to start self injection today, until she knew she could afford [place].  We did review self training process, and Patient is scheduled for next Xolair injection, 08/24/20. Will start self injection, if Patient agrees.

## 2020-07-28 NOTE — Telephone Encounter (Signed)
Patient will start receiving Xolair through [facility].

## 2020-08-03 MED FILL — XOLAIR 150 MG/ML SOSY: 150 | 28 days supply | Qty: 1 | Fill #0

## 2020-08-08 ENCOUNTER — Telehealth: Payer: Self-pay | Admitting: Pulmonary Disease

## 2020-08-08 NOTE — Telephone Encounter (Signed)
Xolair Prefilled Syringe Received:  150mg  Prefilled Syringe >> quantity #1, lot # [id number], exp date 04/05/2021 75mg  Prefilled Syringe >> quantity #n/a Medication arrival date: 08/08/20 Received by: Talmage Teaster,LPN

## 2020-08-24 ENCOUNTER — Other Ambulatory Visit: Payer: Self-pay

## 2020-08-24 ENCOUNTER — Ambulatory Visit (INDEPENDENT_AMBULATORY_CARE_PROVIDER_SITE_OTHER): Payer: BC Managed Care – PPO

## 2020-08-24 DIAGNOSIS — J4550 Severe persistent asthma, uncomplicated: Secondary | ICD-10-CM

## 2020-08-24 DIAGNOSIS — Z7952 Long term (current) use of systemic steroids: Secondary | ICD-10-CM

## 2020-08-24 MED ORDER — OMALIZUMAB 150 MG/ML ~~LOC~~ SOSY
150.0000 mg | PREFILLED_SYRINGE | Freq: Once | SUBCUTANEOUS | Status: AC
  Administered 2020-08-24: 150 mg via SUBCUTANEOUS

## 2020-08-24 NOTE — Progress Notes (Signed)
Have you been hospitalized within the last 10 days?  No Do you have a fever?  No Do you have a cough?  No Do you have a headache or sore throat? No Do you have your Epi Pen visible and is it within date?  Yes 

## 2020-09-11 ENCOUNTER — Other Ambulatory Visit: Payer: Self-pay | Admitting: Pulmonary Disease

## 2020-09-19 MED FILL — XOLAIR 150 MG/ML SOSY: 150 | 28 days supply | Qty: 1 | Fill #1

## 2020-09-21 ENCOUNTER — Other Ambulatory Visit: Payer: Self-pay

## 2020-09-21 ENCOUNTER — Ambulatory Visit (INDEPENDENT_AMBULATORY_CARE_PROVIDER_SITE_OTHER): Payer: BC Managed Care – PPO

## 2020-09-21 DIAGNOSIS — J4550 Severe persistent asthma, uncomplicated: Secondary | ICD-10-CM

## 2020-09-21 DIAGNOSIS — Z7952 Long term (current) use of systemic steroids: Secondary | ICD-10-CM | POA: Diagnosis not present

## 2020-09-21 MED ORDER — OMALIZUMAB 150 MG/ML ~~LOC~~ SOSY
150.0000 mg | PREFILLED_SYRINGE | Freq: Once | SUBCUTANEOUS | Status: AC
  Administered 2020-09-21: 150 mg via SUBCUTANEOUS

## 2020-09-21 NOTE — Progress Notes (Signed)
Have you been hospitalized within the last 10 days?  No Do you have a fever?  No Do you have a cough?  No Do you have a headache or sore throat? No Do you have your Epi Pen visible and is it within date?  Yes 

## 2020-10-18 MED FILL — XOLAIR 150 MG/ML SOSY: 150 | 28 days supply | Qty: 1 | Fill #2

## 2020-10-19 ENCOUNTER — Telehealth: Payer: Self-pay | Admitting: Pulmonary Disease

## 2020-10-19 NOTE — Telephone Encounter (Signed)
Xolair Prefilled Syringe Received:  150mg  Prefilled Syringe >> quantity #1, lot # [id number], exp date 06/06/2021 75mg  Prefilled Syringe >> quantity #n/a Medication arrival date: 10/19/20 Received by: Ronny Korff,LPN

## 2020-10-23 ENCOUNTER — Ambulatory Visit: Payer: BC Managed Care – PPO

## 2020-10-26 ENCOUNTER — Ambulatory Visit: Payer: BC Managed Care – PPO

## 2020-10-26 ENCOUNTER — Other Ambulatory Visit: Payer: Self-pay

## 2020-10-26 ENCOUNTER — Ambulatory Visit (INDEPENDENT_AMBULATORY_CARE_PROVIDER_SITE_OTHER): Payer: BC Managed Care – PPO

## 2020-10-26 DIAGNOSIS — Z7952 Long term (current) use of systemic steroids: Secondary | ICD-10-CM

## 2020-10-26 DIAGNOSIS — J4550 Severe persistent asthma, uncomplicated: Secondary | ICD-10-CM

## 2020-10-26 MED ORDER — OMALIZUMAB 150 MG/ML ~~LOC~~ SOSY
150.0000 mg | PREFILLED_SYRINGE | Freq: Once | SUBCUTANEOUS | Status: AC
  Administered 2020-10-26: 150 mg via SUBCUTANEOUS

## 2020-10-26 NOTE — Progress Notes (Signed)
Have you been hospitalized within the last 10 days?  No Do you have a fever?  No Do you have a cough?  No Do you have a headache or sore throat? No Do you have your Epi Pen visible and is it within date?  Yes 

## 2020-11-08 ENCOUNTER — Other Ambulatory Visit: Payer: Self-pay | Admitting: Family Medicine

## 2020-11-20 MED FILL — XOLAIR 150 MG/ML SOSY: 150 | 28 days supply | Qty: 1 | Fill #3

## 2020-11-21 ENCOUNTER — Telehealth: Payer: Self-pay | Admitting: Pulmonary Disease

## 2020-11-21 NOTE — Telephone Encounter (Signed)
Xolair Prefilled Syringe Received:  150mg  Prefilled Syringe >> quantity #1, lot # [id number], exp date 09/05/2021 75mg  Prefilled Syringe >> quantity #n/a Medication arrival date: 11/21/20 Received by: Tank Difiore,LPN

## 2020-11-23 ENCOUNTER — Other Ambulatory Visit: Payer: Self-pay

## 2020-11-23 ENCOUNTER — Ambulatory Visit (INDEPENDENT_AMBULATORY_CARE_PROVIDER_SITE_OTHER): Payer: BC Managed Care – PPO

## 2020-11-23 DIAGNOSIS — J4550 Severe persistent asthma, uncomplicated: Secondary | ICD-10-CM | POA: Diagnosis not present

## 2020-11-23 DIAGNOSIS — Z7952 Long term (current) use of systemic steroids: Secondary | ICD-10-CM

## 2020-11-23 MED ORDER — OMALIZUMAB 150 MG/ML ~~LOC~~ SOSY
150.0000 mg | PREFILLED_SYRINGE | Freq: Once | SUBCUTANEOUS | Status: AC
  Administered 2020-11-23: 150 mg via SUBCUTANEOUS

## 2020-11-23 NOTE — Progress Notes (Signed)
Have you been hospitalized within the last 10 days?  No Do you have a fever?  No Do you have a cough?  No Do you have a headache or sore throat? No Do you have your Epi Pen visible and is it within date?  Yes 

## 2020-12-05 ENCOUNTER — Other Ambulatory Visit (HOSPITAL_COMMUNITY): Payer: Self-pay | Admitting: Pharmacy Technician

## 2020-12-05 DIAGNOSIS — J4550 Severe persistent asthma, uncomplicated: Secondary | ICD-10-CM | POA: Insufficient documentation

## 2020-12-13 MED FILL — XOLAIR 150 MG/ML SOSY: 150 | 28 days supply | Qty: 1 | Fill #4

## 2020-12-14 ENCOUNTER — Telehealth: Payer: Self-pay | Admitting: Pulmonary Disease

## 2020-12-14 NOTE — Telephone Encounter (Signed)
Xolair Prefilled Syringe Received:  150mg  Prefilled Syringe >> quantity #1, lot # [id number], exp date 09/05/2021 75mg  Prefilled Syringe >> quantity #n/a Medication arrival date: 12/14/20 Received by: Partick Musselman,LPN

## 2020-12-21 ENCOUNTER — Other Ambulatory Visit: Payer: Self-pay

## 2020-12-21 ENCOUNTER — Telehealth: Payer: Self-pay | Admitting: Pulmonary Disease

## 2020-12-21 ENCOUNTER — Ambulatory Visit (INDEPENDENT_AMBULATORY_CARE_PROVIDER_SITE_OTHER): Payer: BC Managed Care – PPO

## 2020-12-21 VITALS — BP 160/93 | HR 80 | Temp 97.5°F | Resp 18

## 2020-12-21 DIAGNOSIS — J4550 Severe persistent asthma, uncomplicated: Secondary | ICD-10-CM

## 2020-12-21 MED ORDER — EPINEPHRINE 0.3 MG/0.3ML IJ SOAJ: 0.3000 mg | Freq: Once | INTRAMUSCULAR | Status: DC | PRN

## 2020-12-21 MED ORDER — SODIUM CHLORIDE 0.9 % IV SOLN: Freq: Once | INTRAVENOUS | Status: DC | PRN

## 2020-12-21 MED ORDER — OMALIZUMAB 150 MG/ML ~~LOC~~ SOSY
150.0000 mg | PREFILLED_SYRINGE | Freq: Once | SUBCUTANEOUS | Status: AC
Start: 2020-12-21 — End: 2020-12-21
  Administered 2020-12-21: 150 mg via SUBCUTANEOUS
  Filled 2020-12-21: qty 1

## 2020-12-21 MED ORDER — METHYLPREDNISOLONE SODIUM SUCC 125 MG IJ SOLR: 125.0000 mg | Freq: Once | INTRAMUSCULAR | Status: DC | PRN

## 2020-12-21 MED ORDER — FAMOTIDINE IN NACL 20-0.9 MG/50ML-% IV SOLN: 20.0000 mg | Freq: Once | INTRAVENOUS | Status: DC | PRN

## 2020-12-21 MED ORDER — ALBUTEROL SULFATE HFA 108 (90 BASE) MCG/ACT IN AERS: 2.0000 | INHALATION_SPRAY | Freq: Once | RESPIRATORY_TRACT | Status: DC | PRN

## 2020-12-21 MED ORDER — DIPHENHYDRAMINE HCL 50 MG/ML IJ SOLN: 50.0000 mg | Freq: Once | INTRAMUSCULAR | Status: DC | PRN

## 2020-12-21 NOTE — Telephone Encounter (Signed)
Patient scheduled with pharmacy clinic on 01/22/21 @2 :20pm. Patient advised to bring Epipen. Will provide this information to [facility] infusion center team. Rx will be couriered from [facility], PharmD, MPH Clinical Pharmacist (Rheumatology and Pulmonology)

## 2020-12-21 NOTE — Progress Notes (Signed)
Patient upset at the new process for injections under the [place] at [facility].  Verbalized that she didn't need all of the steps we were taking and that she would just do it herself going forward.  Contacted Salome Arnt with [facility] and they will contact patient to setup the self administration process.

## 2020-12-21 NOTE — Progress Notes (Signed)
Diagnosis: Asthma  Provider: Dr. Wonda Amis   Procedure: Injection, Medication: Xolair 150mg , Site: subcutaneous left arm by Koren Shiver, RN  Discharge: Condition: Good, Destination: Home . AVS provided. by : Pt declined.

## 2020-12-21 NOTE — Telephone Encounter (Signed)
Received message from infusion clinic that Patient is interested in self injections. Patient is with [facility]. I have discussed self injections in the past and Patient did not want to try on several different appointments. Patient may be willing to come in with [facility] to administer Xolair injection.  Message routed to [facility]

## 2020-12-26 ENCOUNTER — Other Ambulatory Visit: Payer: Self-pay | Admitting: Pulmonary Disease

## 2020-12-26 DIAGNOSIS — J4550 Severe persistent asthma, uncomplicated: Secondary | ICD-10-CM

## 2020-12-26 DIAGNOSIS — Z7952 Long term (current) use of systemic steroids: Secondary | ICD-10-CM

## 2020-12-26 MED ORDER — EPINEPHRINE 0.3 MG/0.3ML IJ SOAJ: 0.3000 mg | INTRAMUSCULAR | 2 refills | Status: DC | PRN

## 2020-12-26 MED FILL — EPINEPHRINE 0.3 MG AUTO-INJ: 0.3 | 30 days supply | Qty: 2 | Fill #0

## 2020-12-26 NOTE — Telephone Encounter (Signed)
Prescription re-sent to [facility]. Copay is $15. Patient aware that pharmacy will reach out to collect copay and they are able to ship to patient.

## 2020-12-26 NOTE — Telephone Encounter (Signed)
Good morning, This is the patient I was asking about the Epipen refill.  Thank you so much.

## 2020-12-26 NOTE — Telephone Encounter (Signed)
PA not required for all NDC's. [facility] does not have plan preferred NDC, rx sent to [facility]. RX processed for $15.00. They will mail to patient.   Called patient and advised.

## 2020-12-29 ENCOUNTER — Other Ambulatory Visit (HOSPITAL_COMMUNITY): Payer: Self-pay

## 2021-01-05 NOTE — Telephone Encounter (Signed)
Called pt to discuss availability with Dr. Loanne Drilling this month. Scheduled 6 month rov. Nothing further needed at this time- will close encounter.

## 2021-01-08 NOTE — Progress Notes (Signed)
Diagnosis: Asthma  Provider:  Marshell Garfinkel, MD  Procedure: Injection  Xolair (Omalizumab), Dose: 150 mg, Site: subcutaneous  Discharge: Condition: Good, Destination: Home . AVS provided to patient.   Performed by:  Jonelle Sidle, RN

## 2021-01-11 ENCOUNTER — Other Ambulatory Visit (HOSPITAL_COMMUNITY): Payer: Self-pay

## 2021-01-11 MED FILL — Omalizumab Subcutaneous Soln Prefilled Syringe 150 MG/ML: SUBCUTANEOUS | 28 days supply | Qty: 1 | Fill #0 | Status: AC

## 2021-01-15 ENCOUNTER — Other Ambulatory Visit (HOSPITAL_COMMUNITY): Payer: Self-pay

## 2021-01-17 ENCOUNTER — Other Ambulatory Visit (HOSPITAL_COMMUNITY): Payer: Self-pay

## 2021-01-18 ENCOUNTER — Ambulatory Visit: Payer: BC Managed Care – PPO

## 2021-01-18 ENCOUNTER — Other Ambulatory Visit (HOSPITAL_COMMUNITY): Payer: Self-pay

## 2021-01-18 NOTE — Telephone Encounter (Signed)
Medication has delivered to clinic

## 2021-01-22 ENCOUNTER — Ambulatory Visit: Payer: BC Managed Care – PPO | Admitting: Pharmacist

## 2021-01-22 ENCOUNTER — Other Ambulatory Visit: Payer: Self-pay

## 2021-01-22 DIAGNOSIS — J4550 Severe persistent asthma, uncomplicated: Secondary | ICD-10-CM

## 2021-01-22 DIAGNOSIS — Z7952 Long term (current) use of systemic steroids: Secondary | ICD-10-CM

## 2021-01-22 DIAGNOSIS — Z79899 Other long term (current) drug therapy: Secondary | ICD-10-CM

## 2021-01-22 DIAGNOSIS — Z7189 Other specified counseling: Secondary | ICD-10-CM

## 2021-01-22 NOTE — Progress Notes (Signed)
HPI Patient presents today to [facility] to see pharmacy team for transitioning to self-administration of Xolair.  Past medical history includes severe persistent asthma and seasonal allergic rhinitis. Never smoked. Xolair was started in November 2013 and states she has tolerated well.   Number of hospitalizations in past year: none  Number of asthma exacerbations in past year:  None   Respiratory Medications Current: Advair Diskus 500/22mcg - 1puff every 12 hours in addition to montelukast 10mg  nightly Tried in past: Spiriva, Incruse Ellipta Patient reports no known adherence challenges  OBJECTIVE Allergies  Allergen Reactions  . Azithromycin     zpak does not work for the pt    Outpatient Encounter Medications as of 01/22/2021  Medication Sig  . albuterol (VENTOLIN HFA) 108 (90 Base) MCG/ACT inhaler Inhale 1-2 puffs into the lungs every 4 (four) hours as needed for wheezing or shortness of breath.  Francia Greaves THYROID 60 MG tablet TAKE 1 TABLET BY MOUTH EVERY DAY  . aspirin 81 MG tablet Take 81 mg by mouth daily.   . cetirizine (ZYRTEC) 10 MG tablet Take 10 mg by mouth daily as needed.  . Cholecalciferol 50 MCG (2000 UT) CAPS Take 6,000 Units by mouth.  . EPINEPHrine 0.3 mg/0.3 mL IJ SOAJ injection INJECT 1 PEN INTO THE MUSCLE AS NEEDED FOR ANAPHYLAXIS.  Marland Kitchen Fluticasone-Salmeterol (ADVAIR DISKUS) 500-50 MCG/DOSE AEPB INHALE 1 PUFF INTO THE LUNGS EVERY 12 HOURS  . lisinopril (ZESTRIL) 5 MG tablet TAKE 1 TABLET BY MOUTH EVERY DAY  . montelukast (SINGULAIR) 10 MG tablet Take 1 tablet (10 mg total) by mouth at bedtime.  Marland Kitchen omalizumab (XOLAIR) 150 MG/ML prefilled syringe INJECT 150 MG INTO THE SKIN EVERY 28 (TWENTY-EIGHT) DAYS.  Marland Kitchen pantoprazole (PROTONIX) 40 MG tablet TAKE 1 TABLET BY MOUTH TWICE A DAY  . ranitidine (ZANTAC) 75 MG tablet Take 1 tablet (75 mg total) by mouth at bedtime as needed.  . simvastatin (ZOCOR) 40 MG tablet TAKE 1 TABLET BY MOUTH EVERYDAY AT BEDTIME    Facility-Administered Encounter Medications as of 01/22/2021  Medication  . omalizumab Arvid Right) injection 150 mg  . omalizumab Arvid Right) injection 150 mg  . omalizumab Arvid Right) injection 150 mg     Immunization History  Administered Date(s) Administered  . PFIZER(Purple Top)SARS-COV-2 Vaccination 01/14/2020, 04/14/2020  . Tdap 01/21/2011      Assessment   1. Biologics training (Omalizumab (Xolair) o MOA: IgG monoclonal antibody (recombinant DNA derived) which inhibits IgE binding to the high-affinity IgE receptor on mast cells and basophils.  o Response to therapy: 3-6 months o Side effects: Anaphylaxis (0.1%) (black boxed warning), injection site reaction (45%), arthralgia (2.9% to 8%), headache (3% to 15%) o Dosing: SubQ every 4 weeks based on weight and pretreatment serum IgE o Administration:  1. Dose: 150mg  every 4 weeks 2.  Do not inject into moles, scars, bruises, tender areas, or broken skin. 3. Injections may take 5 to 10 seconds to administer (solution is slightly viscous). 4. Administer only under direct medical supervision and observe patient for 2 hours after the first 3 injections and 30 minutes after subsequent injections or in accordance with individual institution policies and procedures 5. Recommended injection sites include the upper arm and the front and middle of the thighs.  Patient self-administered into left thigh with correct injection technique. Medication: Xolair 150mg /mL pre-filled syirnge [id number]:[address]-81 Lot: [id number] Exp: 10/2021  2. Medication Reconciliation  A drug regimen assessment was performed, including review of allergies, interactions, disease-state management, dosing and immunization history. Medications  were reviewed with the patient, including name, instructions, indication, goals of therapy, potential side effects, importance of adherence, and safe use.  Drug interaction(s): none  3. Immunizations  Patient is indicated for the  influenzae, pneumonia, and shingles vaccinations. Due for Td (last 01/21/2011) Due for pneumonia vaccine next year when she turns 65 y/o Due for Shingles  PLAN - Continue Xolair 150mg  every 28 days.  - Continue Advair Diskuss 500/50 (1 puff twice daily) and montelukast 10mg  nightly. - F/u with Dr. Loanne Drilling scheduled 01/30/21  All questions encouraged and answered.  Instructed patient to reach out with any further questions or concerns.  Thank you for allowing pharmacy to participate in this patient's care.  Knox Saliva, PharmD, MPH Clinical Pharmacist (Rheumatology and Pulmonology)

## 2021-01-22 NOTE — Patient Instructions (Incomplete)
Your Xolair will be mailed from [facility]. Their phone number is [phone]. They will call YOU to schedule shipment when you are due.  Next dose is due: 5/16, 6/13, every 4 weeks thereafter  Take 150mg  (1 syringe) every 4 weeks. Set a reminder in your phone calendar or written calendar.

## 2021-01-30 ENCOUNTER — Other Ambulatory Visit: Payer: Self-pay

## 2021-01-30 ENCOUNTER — Ambulatory Visit: Payer: BC Managed Care – PPO | Admitting: Pulmonary Disease

## 2021-01-30 ENCOUNTER — Encounter: Payer: Self-pay | Admitting: Pulmonary Disease

## 2021-01-30 VITALS — BP 130/84 | HR 60 | Temp 97.8°F | Ht 65.0 in | Wt 177.0 lb

## 2021-01-30 DIAGNOSIS — J4550 Severe persistent asthma, uncomplicated: Secondary | ICD-10-CM | POA: Diagnosis not present

## 2021-01-30 DIAGNOSIS — Z7952 Long term (current) use of systemic steroids: Secondary | ICD-10-CM

## 2021-01-30 NOTE — Patient Instructions (Addendum)
  CONTINUE scheduled Xolair. Starting home administration  CONTINUE Advair 500-50 mcg daily. This is your EVERYDAY inhaler CONTINUE Albuterol inhaler as needed. This is your rescue inhaler CONTINUE Singulair 10 mg daily CONTINUE Zyrtec 10 mg daily  Asthma Action Plan Increase Advair to ONE puff twice a day for worsening shortness of breath, wheezing and cough. If you symptoms do not improve in 24-48 hours, please our office for evaluation and/or prednisone taper.  Follow-up with me in 6 months

## 2021-01-30 NOTE — Progress Notes (Signed)
Subjective:   PATIENT ID: Amanda [name] GENDER: female DOB: January 20, 1956, MRN: [id number]   HPI  Chief Complaint  Patient presents with  . Follow-up    Sob with exertion, wheezing. Same as last visit    Reason for Visit: Follow-up asthma  [name] is a 65 year old never smoker with severe persistent asthma with steroid dependence currently on Xolair, seasonal allergic rhinitis who present for follow-up for asthma  She was previously a patient of Dr. Lenna Gilford, whom she followed for many years. Last seen in October 2019 with stable asthma symptoms during that visit. Symptoms are triggered by exertion, illness, strong odors and allergies. Denies nocturnal symptoms.  She reports she overall well controlled on Xolair and Advair 500-50 mcg once a day. She has had to increased Advair to twice a day when she exerts herself more and during changes of seasons. Denies any exacerbation since our last visit. Last week had a cold that self-resolved after a few days. Still has residual cough that is aggravated by pollen. Has been >2 years since needing steroids.  Asthma Control Test ACT Total Score  01/30/2021 19  07/20/2020 14    Social History: Has Education officer, community controller in [employer] exposures: Cats  I have personally reviewed patient's past medical/family/social history/allergies/current medications.  Past Medical History:  Diagnosis Date  . Allergic rhinitis   . Asthma   . Gastroesophageal reflux disease with hiatal hernia   . History of shingles   . Hyperlipidemia   . Hypothyroidism   . Osteoarthritis   . Overweight(278.02)   . Vitamin D deficiency     Allergies  Allergen Reactions  . Azithromycin     zpak does not work for the pt     Outpatient Medications Prior to Visit  Medication Sig Dispense Refill  . ARMOUR THYROID 60 MG tablet TAKE 1 TABLET BY MOUTH EVERY DAY 90 tablet 3  . aspirin 81 MG tablet Take 81 mg by mouth daily.     .  cetirizine (ZYRTEC) 10 MG tablet Take 10 mg by mouth daily as needed.    . Cholecalciferol 50 MCG (2000 UT) CAPS Take 6,000 Units by mouth.    . EPINEPHrine 0.3 mg/0.3 mL IJ SOAJ injection INJECT 1 PEN INTO THE MUSCLE AS NEEDED FOR ANAPHYLAXIS. 2 each 2  . Fluticasone-Salmeterol (ADVAIR DISKUS) 500-50 MCG/DOSE AEPB INHALE 1 PUFF INTO THE LUNGS EVERY 12 HOURS 60 each 5  . lisinopril (ZESTRIL) 5 MG tablet TAKE 1 TABLET BY MOUTH EVERY DAY 90 tablet 0  . montelukast (SINGULAIR) 10 MG tablet Take 1 tablet (10 mg total) by mouth at bedtime. 90 tablet 2  . omalizumab (XOLAIR) 150 MG/ML prefilled syringe INJECT 150 MG INTO THE SKIN EVERY 28 (TWENTY-EIGHT) DAYS. 1 mL 11  . pantoprazole (PROTONIX) 40 MG tablet TAKE 1 TABLET BY MOUTH TWICE A DAY 180 tablet 2  . ranitidine (ZANTAC) 75 MG tablet Take 1 tablet (75 mg total) by mouth at bedtime as needed. 30 tablet 11  . simvastatin (ZOCOR) 40 MG tablet TAKE 1 TABLET BY MOUTH EVERYDAY AT BEDTIME 90 tablet 3  . albuterol (VENTOLIN HFA) 108 (90 Base) MCG/ACT inhaler Inhale 1-2 puffs into the lungs every 4 (four) hours as needed for wheezing or shortness of breath. 8 g 12   No facility-administered medications prior to visit.    Review of Systems  Constitutional: Negative for chills, diaphoresis, fever, malaise/fatigue and weight loss.  HENT: Negative for congestion.  Respiratory: Negative for cough, hemoptysis, sputum production, shortness of breath and wheezing.   Cardiovascular: Negative for chest pain, palpitations and leg swelling.   Objective:   Vitals:   01/30/21 1533  BP: 130/84  Pulse: 60  Temp: 97.8 F (36.6 C)  SpO2: 97%  Weight: 177 lb (80.3 kg)  Height: 5\' 5"  (1.651 m)   SpO2: 97 % O2 Device: None (Room air)  Physical Exam: General: Well-appearing, no acute distress HENT: [place], AT Eyes: EOMI, no scleral icterus Respiratory: Clear to auscultation bilaterally.  No crackles, wheezing or rales Cardiovascular: RRR, -M/R/G, no  JVD Extremities:-Edema,-tenderness Neuro: AAO x4, CNII-XII grossly intact Skin: Intact, no rashes or bruising Psych: Normal mood, normal affect   Data Reviewed:  Imaging: CXR 01/27/17 - no acute cardiopulmonary abnormalities. No infiltrate, effusion or edema  PFT: 01/16/16 - Ratio 58 FEV1 43% FVC 58% Interpretation: Severe obstructive defect  Labs: CBC with diff 08/06/18 - WBC 8.5 with Eos 5.3% (400 absolute eos)  Imaging, labs and test noted above have been reviewed independently by me.    Assessment & Plan:   Discussion: 65 year old female with severe persistent asthma with prior steroid dependence well-controlled on LABA/ICS and Xolair since 2017.  CONTINUE scheduled Xolair. Starting home administration  CONTINUE Advair 500-50 mcg daily. This is your EVERYDAY inhaler CONTINUE Albuterol inhaler as needed. This is your rescue inhaler CONTINUE Singulair 10 mg daily CONTINUE Zyrtec 10 mg daily  Asthma Action Plan Increase Advair to ONE puff twice a day for worsening shortness of breath, wheezing and cough. If you symptoms do not improve in 24-48 hours, please our office for evaluation and/or prednisone taper.  Health Maintenance Immunization History  Administered Date(s) Administered  . PFIZER(Purple Top)SARS-COV-2 Vaccination 01/14/2020, 04/14/2020  . Tdap 01/21/2011   No orders of the defined types were placed in this encounter.  No orders of the defined types were placed in this encounter.  Return in about 6 months (around 08/01/2021).  [place], MD [facility] 01/30/2021 3:52 PM  Office Number [phone]

## 2021-02-06 ENCOUNTER — Other Ambulatory Visit: Payer: Self-pay | Admitting: Family Medicine

## 2021-02-12 ENCOUNTER — Other Ambulatory Visit (HOSPITAL_COMMUNITY): Payer: Self-pay

## 2021-02-14 ENCOUNTER — Other Ambulatory Visit (HOSPITAL_COMMUNITY): Payer: Self-pay

## 2021-02-14 MED FILL — Omalizumab Subcutaneous Soln Prefilled Syringe 150 MG/ML: SUBCUTANEOUS | 28 days supply | Qty: 1 | Fill #1 | Status: AC

## 2021-02-15 ENCOUNTER — Ambulatory Visit: Payer: BC Managed Care – PPO

## 2021-02-15 ENCOUNTER — Other Ambulatory Visit (HOSPITAL_COMMUNITY): Payer: Self-pay

## 2021-03-02 ENCOUNTER — Other Ambulatory Visit: Payer: Self-pay | Admitting: Family Medicine

## 2021-03-13 ENCOUNTER — Other Ambulatory Visit (HOSPITAL_COMMUNITY): Payer: Self-pay

## 2021-03-13 MED FILL — Omalizumab Subcutaneous Soln Prefilled Syringe 150 MG/ML: SUBCUTANEOUS | 28 days supply | Qty: 1 | Fill #2 | Status: AC

## 2021-03-15 ENCOUNTER — Ambulatory Visit: Payer: BC Managed Care – PPO

## 2021-03-19 ENCOUNTER — Other Ambulatory Visit (HOSPITAL_COMMUNITY): Payer: Self-pay

## 2021-04-10 ENCOUNTER — Other Ambulatory Visit (HOSPITAL_COMMUNITY): Payer: Self-pay

## 2021-04-10 MED FILL — Omalizumab Subcutaneous Soln Prefilled Syringe 150 MG/ML: SUBCUTANEOUS | 28 days supply | Qty: 1 | Fill #3 | Status: AC

## 2021-04-16 ENCOUNTER — Other Ambulatory Visit (HOSPITAL_COMMUNITY): Payer: Self-pay

## 2021-04-18 ENCOUNTER — Other Ambulatory Visit: Payer: Self-pay | Admitting: Pulmonary Disease

## 2021-04-19 ENCOUNTER — Ambulatory Visit: Payer: BC Managed Care – PPO

## 2021-04-22 ENCOUNTER — Other Ambulatory Visit: Payer: Self-pay | Admitting: Family Medicine

## 2021-04-30 ENCOUNTER — Other Ambulatory Visit: Payer: Self-pay | Admitting: Family Medicine

## 2021-05-04 ENCOUNTER — Other Ambulatory Visit (HOSPITAL_COMMUNITY): Payer: Self-pay

## 2021-05-04 MED FILL — Omalizumab Subcutaneous Soln Prefilled Syringe 150 MG/ML: SUBCUTANEOUS | 28 days supply | Qty: 1 | Fill #4 | Status: CN

## 2021-05-09 ENCOUNTER — Other Ambulatory Visit (HOSPITAL_COMMUNITY): Payer: Self-pay

## 2021-05-09 MED FILL — Omalizumab Subcutaneous Soln Prefilled Syringe 150 MG/ML: SUBCUTANEOUS | 28 days supply | Qty: 1 | Fill #4 | Status: AC

## 2021-05-17 ENCOUNTER — Ambulatory Visit: Payer: BC Managed Care – PPO

## 2021-05-18 ENCOUNTER — Other Ambulatory Visit: Payer: Self-pay | Admitting: Family Medicine

## 2021-05-27 ENCOUNTER — Other Ambulatory Visit: Payer: Self-pay | Admitting: Family Medicine

## 2021-05-29 ENCOUNTER — Other Ambulatory Visit: Payer: Self-pay | Admitting: Family Medicine

## 2021-05-29 ENCOUNTER — Other Ambulatory Visit: Payer: Self-pay

## 2021-05-29 MED ORDER — LISINOPRIL 5 MG PO TABS: 5.0000 mg | ORAL_TABLET | Freq: Every day | ORAL | 0 refills | Status: DC

## 2021-05-29 MED ORDER — ALBUTEROL SULFATE HFA 108 (90 BASE) MCG/ACT IN AERS: 1.0000 | INHALATION_SPRAY | RESPIRATORY_TRACT | 2 refills | Status: DC | PRN

## 2021-05-29 NOTE — Telephone Encounter (Signed)
Pt was called and did not want to come to the office since she was "fine and doesn't need to be seen"; I have explained to her that we can not legally continue to refill her medication without a visit for her safety. I scheduled her an appointment for 06/06/21 at 320pm. I have also filled her medication for 30 days until she is seen. Pt stated understanding.

## 2021-06-05 NOTE — Progress Notes (Addendum)
[facility] [place], [place],  [address] [phone]  Date:  06/06/2021   Name:  Amanda [name]   DOB:  [birth year]   MRN:  [id number]  PCP:  Darreld Mclean, MD    Chief Complaint: Med check   History of Present Illness:  Amanda [name] is a 65 y.o. very pleasant female patient who presents with the following:  Pt seen today for follow-up - history of asthma, venous insufficiency, allergies, allergies, hypothyroidism, hyperlipidemia Last seen by myself in May of 2021  She sees pulmonology for asthma- last visit in April   Covid series- done  Pneumonia vaccine- she declines Shingrix- declines  Pap- pt declines  Mammo- pt would like to do at the [facility], ordered for her today  Time for cologuard- she is ok with my ordering this  Needs routine labs  Tetanus - declines   She notes discomfort in her right sided thoracolumbar back, present for about 6 months.  She is not aware of any particular injury.  Pain does not tend to radiate or move.  She does note there are some apparent lipomas in this area which are palpable   Patient Active Problem List   Diagnosis Date Noted   Severe persistent asthma 12/05/2020   Severe persistent asthma dependent on systemic steroids without complication [id number]   Chronic venous insufficiency 07/25/2016   Pain of left leg 03/19/2016   LPRD (laryngopharyngeal reflux disease) 01/16/2016   Vitamin D deficiency 06/22/2009   Backache 09/05/2008   Overweight 04/13/2008   Anxiety state 04/13/2008   Allergic rhinitis 04/13/2008   Diaphragmatic hernia 04/13/2008   Osteoarthritis 04/13/2008   Hypothyroidism 04/12/2008   Hyperlipidemia 04/12/2008    Past Medical History:  Diagnosis Date   Allergic rhinitis    Asthma    Gastroesophageal reflux disease with hiatal hernia    History of shingles    Hyperlipidemia    Hypothyroidism    Osteoarthritis     Overweight(278.02)    Vitamin D deficiency     Past Surgical History:  Procedure Laterality Date   breast reductions  2000   in [place]   left knee arthroscopy  1999   by Dr. Eddie Dibbles   REDUCTION MAMMAPLASTY      Social History   Tobacco Use   Smoking status: Never   Smokeless tobacco: Never  Vaping Use   Vaping Use: Never used  Substance Use Topics   Alcohol use: Yes    Comment: social use   Drug use: No    No family history on file.  Allergies  Allergen Reactions   Azithromycin     zpak does not work for the pt    Medication list has been reviewed and updated.  Current Outpatient Medications on File Prior to Visit  Medication Sig Dispense Refill   albuterol (VENTOLIN HFA) 108 (90 Base) MCG/ACT inhaler Inhale 1-2 puffs into the lungs every 4 (four) hours as needed for wheezing or shortness of breath. 8 g 2   ARMOUR THYROID 60 MG tablet TAKE 1 TABLET BY MOUTH EVERY DAY 90 tablet 3   aspirin 81 MG tablet Take 81 mg by mouth daily.      cetirizine (ZYRTEC) 10 MG tablet Take 10 mg by mouth daily as needed.     Cholecalciferol 50 MCG (2000 UT) CAPS Take 6,000 Units by mouth.     EPINEPHrine 0.3 mg/0.3 mL IJ SOAJ injection  INJECT 1 PEN INTO THE MUSCLE AS NEEDED FOR ANAPHYLAXIS. 2 each 2   Fluticasone-Salmeterol (ADVAIR DISKUS) 500-50 MCG/DOSE AEPB INHALE 1 PUFF INTO THE LUNGS EVERY 12 HOURS 60 each 5   lisinopril (ZESTRIL) 5 MG tablet Take 1 tablet (5 mg total) by mouth daily. 30 tablet 0   montelukast (SINGULAIR) 10 MG tablet TAKE 1 TABLET BY MOUTH EVERYDAY AT BEDTIME 90 tablet 0   omalizumab (XOLAIR) 150 MG/ML prefilled syringe INJECT 150 MG INTO THE SKIN EVERY 28 (TWENTY-EIGHT) DAYS. 1 mL 11   pantoprazole (PROTONIX) 40 MG tablet TAKE 1 TABLET BY MOUTH TWICE A DAY 180 tablet 2   simvastatin (ZOCOR) 40 MG tablet TAKE 1 TABLET BY MOUTH EVERYDAY AT BEDTIME 30 tablet 0   No current facility-administered medications on file prior to visit.    Review of Systems:  As per  HPI- otherwise negative.   Physical Examination: Vitals:   06/06/21 1517  BP: 128/68  Pulse: 61  Resp: 18  Temp: 97.6 F (36.4 C)  SpO2: 99%   Vitals:   06/06/21 1517  Weight: 175 lb (79.4 kg)  Height: '5\' 5"'$  (1.651 m)   Body mass index is 29.12 kg/m. Ideal Body Weight: Weight in (lb) to have BMI = 25: 149.9  GEN: no acute distress.  Overweight, looks well HEENT: Atraumatic, Normocephalic.  Ears and Nose: No external deformity. CV: RRR, No M/G/R. No JVD. No thrill. No extra heart sounds. PULM: CTA B, no wheezes, crackles, rhonchi. No retractions. No resp. distress. No accessory muscle use. ABD: S, NT, ND, +BS. No rebound. No HSM. EXTR: No c/c/e PSYCH: Normally interactive. Conversant.  Minor scoliosis is present.  She has tenderness and spasm in the right sided paraspinal muscles in the lower thoracic, superior lumbar region Normal thoracolumbar range of motion A few mobile, discrete and rounded masses are palpated, most likely lipoma Assessment and Plan: Hypothyroidism, unspecified type - Plan: thyroid (ARMOUR THYROID) 60 MG tablet, TSH  Mild hypertension - Plan: lisinopril (ZESTRIL) 5 MG tablet, CBC, Comprehensive metabolic panel  Mixed hyperlipidemia - Plan: simvastatin (ZOCOR) 40 MG tablet, Lipid panel  Encounter for screening mammogram for malignant neoplasm of breast - Plan: MM 3D SCREEN BREAST BILATERAL  Screening for colon cancer - Plan: Cologuard  Screening for diabetes mellitus - Plan: Hemoglobin A1c  Fatigue, unspecified type - Plan: TSH, VITAMIN D 25 Hydroxy (Vit-D Deficiency, Fractures)  Chronic right-sided low back pain without sciatica - Plan: methocarbamol (ROBAXIN) 500 MG tablet  Seen today for follow-up, medications are refilled and labs ordered Ordered Cologuard Discussed her back pain, I have offered to perform plain films and/or an ultrasound-for the time being she declines, she would like to try muscle relaxer.  I prescribed Robaxin, cautioned  that this can cause sleepiness She will let me know if this is not getting better or if getting worse This visit occurred during the SARS-CoV-2 public health emergency.  Safety protocols were in place, including screening questions prior to the visit, additional usage of staff PPE, and extensive cleaning of exam room while observing appropriate contact time as indicated for disinfecting solutions. ' Signed Lamar Blinks, MD  I received her labs as below, 9/1.  Message to patient  Results for orders placed or performed in visit on 06/06/21  CBC  Result Value Ref Range   WBC 7.9 4.0 - 10.5 K/uL   RBC 4.21 3.87 - 5.11 Mil/uL   Platelets 240.0 150.0 - 400.0 K/uL   Hemoglobin 13.1 12.0 - 15.0 g/dL  HCT 39.0 36.0 - 46.0 %   MCV 92.5 78.0 - 100.0 fl   MCHC 33.7 30.0 - 36.0 g/dL   RDW 13.1 11.5 - 15.5 %  Comprehensive metabolic panel  Result Value Ref Range   Sodium 140 135 - 145 mEq/L   Potassium 4.0 3.5 - 5.1 mEq/L   Chloride 101 96 - 112 mEq/L   CO2 31 19 - 32 mEq/L   Glucose, Bld 92 70 - 99 mg/dL   BUN 11 6 - 23 mg/dL   Creatinine, Ser 0.76 0.40 - 1.20 mg/dL   Total Bilirubin 0.4 0.2 - 1.2 mg/dL   Alkaline Phosphatase 84 39 - 117 U/L   AST 15 0 - 37 U/L   ALT 14 0 - 35 U/L   Total Protein 6.5 6.0 - 8.3 g/dL   Albumin 4.3 3.5 - 5.2 g/dL   GFR 82.73 >60.00 mL/min   Calcium 9.4 8.4 - 10.5 mg/dL  Hemoglobin A1c  Result Value Ref Range   Hgb A1c MFr Bld 6.1 4.6 - 6.5 %  Lipid panel  Result Value Ref Range   Cholesterol 161 0 - 200 mg/dL   Triglycerides 277.0 (H) 0.0 - 149.0 mg/dL   HDL 43.00 >39.00 mg/dL   VLDL 55.4 (H) 0.0 - 40.0 mg/dL   Total CHOL/HDL Ratio 4    NonHDL 118.20   TSH  Result Value Ref Range   TSH 6.36 (H) 0.35 - 5.50 uIU/mL  VITAMIN D 25 Hydroxy (Vit-D Deficiency, Fractures)  Result Value Ref Range   VITD 34.09 30.00 - 100.00 ng/mL  LDL cholesterol, direct  Result Value Ref Range   Direct LDL 85.0 mg/dL

## 2021-06-06 ENCOUNTER — Other Ambulatory Visit (HOSPITAL_COMMUNITY): Payer: Self-pay

## 2021-06-06 ENCOUNTER — Ambulatory Visit: Payer: BC Managed Care – PPO | Admitting: Family Medicine

## 2021-06-06 ENCOUNTER — Other Ambulatory Visit: Payer: Self-pay

## 2021-06-06 VITALS — BP 128/68 | HR 61 | Temp 97.6°F | Resp 18 | Ht 65.0 in | Wt 175.0 lb

## 2021-06-06 DIAGNOSIS — Z131 Encounter for screening for diabetes mellitus: Secondary | ICD-10-CM | POA: Diagnosis not present

## 2021-06-06 DIAGNOSIS — R5383 Other fatigue: Secondary | ICD-10-CM | POA: Diagnosis not present

## 2021-06-06 DIAGNOSIS — M545 Low back pain, unspecified: Secondary | ICD-10-CM

## 2021-06-06 DIAGNOSIS — I10 Essential (primary) hypertension: Secondary | ICD-10-CM

## 2021-06-06 DIAGNOSIS — Z1231 Encounter for screening mammogram for malignant neoplasm of breast: Secondary | ICD-10-CM | POA: Diagnosis not present

## 2021-06-06 DIAGNOSIS — G8929 Other chronic pain: Secondary | ICD-10-CM

## 2021-06-06 DIAGNOSIS — E782 Mixed hyperlipidemia: Secondary | ICD-10-CM

## 2021-06-06 DIAGNOSIS — Z1211 Encounter for screening for malignant neoplasm of colon: Secondary | ICD-10-CM

## 2021-06-06 DIAGNOSIS — E039 Hypothyroidism, unspecified: Secondary | ICD-10-CM | POA: Diagnosis not present

## 2021-06-06 MED ORDER — LISINOPRIL 5 MG PO TABS: 5.0000 mg | ORAL_TABLET | Freq: Every day | ORAL | 3 refills | Status: DC

## 2021-06-06 MED ORDER — METHOCARBAMOL 500 MG PO TABS: 500.0000 mg | ORAL_TABLET | Freq: Three times a day (TID) | ORAL | 0 refills | Status: DC | PRN

## 2021-06-06 MED ORDER — THYROID 60 MG PO TABS: 60.0000 mg | ORAL_TABLET | Freq: Every day | ORAL | 3 refills | Status: DC

## 2021-06-06 MED ORDER — SIMVASTATIN 40 MG PO TABS: 40.0000 mg | ORAL_TABLET | Freq: Every day | ORAL | 3 refills | Status: DC

## 2021-06-06 MED FILL — Omalizumab Subcutaneous Soln Prefilled Syringe 150 MG/ML: SUBCUTANEOUS | 28 days supply | Qty: 1 | Fill #5 | Status: AC

## 2021-06-06 NOTE — Patient Instructions (Signed)
I will be in touch with your labs asap We will get a Colguard kit sent to you Try the robaxin as needed for muscle spasm and pain- let me know how this works for you We can look further with spine x-rays and ultrasound if you like  Please stop by imaging on the ground floor to schedule your mammogram!

## 2021-06-07 ENCOUNTER — Encounter: Payer: Self-pay | Admitting: Family Medicine

## 2021-06-07 LAB — COMPREHENSIVE METABOLIC PANEL
ALT: 14 U/L (ref 0–35)
AST: 15 U/L (ref 0–37)
Albumin: 4.3 g/dL (ref 3.5–5.2)
Alkaline Phosphatase: 84 U/L (ref 39–117)
BUN: 11 mg/dL (ref 6–23)
CO2: 31 mEq/L (ref 19–32)
Calcium: 9.4 mg/dL (ref 8.4–10.5)
Chloride: 101 mEq/L (ref 96–112)
Creatinine, Ser: 0.76 mg/dL (ref 0.40–1.20)
GFR: 82.73 mL/min (ref >60.00)
Glucose, Bld: 92 mg/dL (ref 70–99)
Potassium: 4 mEq/L (ref 3.5–5.1)
Sodium: 140 mEq/L (ref 135–145)
Total Bilirubin: 0.4 mg/dL (ref 0.2–1.2)
Total Protein: 6.5 g/dL (ref 6.0–8.3)

## 2021-06-07 LAB — LIPID PANEL
Cholesterol: 161 mg/dL (ref 0–200)
HDL: 43 mg/dL (ref >39.00)
NonHDL: 118.2
Total CHOL/HDL Ratio: 4
Triglycerides: 277 mg/dL — ABNORMAL HIGH (ref 0.0–149.0)
VLDL: 55.4 mg/dL — ABNORMAL HIGH (ref 0.0–40.0)

## 2021-06-07 LAB — CBC
HCT: 39 % (ref 36.0–46.0)
Hemoglobin: 13.1 g/dL (ref 12.0–15.0)
MCHC: 33.7 g/dL (ref 30.0–36.0)
MCV: 92.5 fl (ref 78.0–100.0)
Platelets: 240 10*3/uL (ref 150.0–400.0)
RBC: 4.21 Mil/uL (ref 3.87–5.11)
RDW: 13.1 % (ref 11.5–15.5)
WBC: 7.9 10*3/uL (ref 4.0–10.5)

## 2021-06-07 LAB — LDL CHOLESTEROL, DIRECT: Direct LDL: 85 mg/dL

## 2021-06-07 LAB — TSH: TSH: 6.36 u[IU]/mL — ABNORMAL HIGH (ref 0.35–5.50)

## 2021-06-07 LAB — VITAMIN D 25 HYDROXY (VIT D DEFICIENCY, FRACTURES): VITD: 34.09 ng/mL (ref 30.00–100.00)

## 2021-06-07 LAB — HEMOGLOBIN A1C: Hgb A1c MFr Bld: 6.1 % (ref 4.6–6.5)

## 2021-06-07 MED ORDER — THYROID 15 MG PO TABS: 15.0000 mg | ORAL_TABLET | Freq: Every day | ORAL | 6 refills | Status: DC

## 2021-06-07 NOTE — Addendum Note (Signed)
Addended by: Lamar Blinks C on: 06/07/2021 12:44 PM   Modules accepted: Orders

## 2021-06-08 ENCOUNTER — Other Ambulatory Visit: Payer: Self-pay | Admitting: Family Medicine

## 2021-06-08 DIAGNOSIS — E039 Hypothyroidism, unspecified: Secondary | ICD-10-CM

## 2021-06-08 DIAGNOSIS — E782 Mixed hyperlipidemia: Secondary | ICD-10-CM

## 2021-06-12 ENCOUNTER — Other Ambulatory Visit (HOSPITAL_COMMUNITY): Payer: Self-pay

## 2021-06-14 ENCOUNTER — Ambulatory Visit: Payer: BC Managed Care – PPO

## 2021-06-29 DIAGNOSIS — Z1211 Encounter for screening for malignant neoplasm of colon: Secondary | ICD-10-CM | POA: Diagnosis not present

## 2021-07-04 ENCOUNTER — Other Ambulatory Visit: Payer: Self-pay | Admitting: Family Medicine

## 2021-07-04 ENCOUNTER — Encounter: Payer: Self-pay | Admitting: Family Medicine

## 2021-07-04 DIAGNOSIS — R195 Other fecal abnormalities: Secondary | ICD-10-CM

## 2021-07-04 LAB — COLOGUARD: Cologuard: POSITIVE — AB

## 2021-07-05 NOTE — Addendum Note (Signed)
Addended by: Lamar Blinks C on: 07/05/2021 02:13 PM   Modules accepted: Orders

## 2021-07-06 ENCOUNTER — Other Ambulatory Visit (HOSPITAL_COMMUNITY): Payer: Self-pay

## 2021-07-09 ENCOUNTER — Ambulatory Visit (HOSPITAL_BASED_OUTPATIENT_CLINIC_OR_DEPARTMENT_OTHER)
Admission: RE | Admit: 2021-07-09 | Discharge: 2021-07-09 | Disposition: A | Discharge status: Home / Self Care | Payer: BC Managed Care – PPO | Source: Ambulatory Visit | Attending: Family Medicine | Admitting: Family Medicine

## 2021-07-09 ENCOUNTER — Other Ambulatory Visit: Payer: Self-pay

## 2021-07-09 ENCOUNTER — Encounter (HOSPITAL_BASED_OUTPATIENT_CLINIC_OR_DEPARTMENT_OTHER): Payer: Self-pay

## 2021-07-09 ENCOUNTER — Other Ambulatory Visit (HOSPITAL_COMMUNITY): Payer: Self-pay

## 2021-07-09 DIAGNOSIS — Z1231 Encounter for screening mammogram for malignant neoplasm of breast: Secondary | ICD-10-CM | POA: Diagnosis not present

## 2021-07-09 MED FILL — Omalizumab Subcutaneous Soln Prefilled Syringe 150 MG/ML: SUBCUTANEOUS | 28 days supply | Qty: 1 | Fill #6 | Status: AC

## 2021-07-10 ENCOUNTER — Other Ambulatory Visit (HOSPITAL_COMMUNITY): Payer: Self-pay

## 2021-07-13 ENCOUNTER — Encounter: Payer: Self-pay | Admitting: Family Medicine

## 2021-07-16 ENCOUNTER — Ambulatory Visit: Payer: BC Managed Care – PPO | Admitting: Gastroenterology

## 2021-07-17 ENCOUNTER — Other Ambulatory Visit: Payer: Self-pay | Admitting: Pulmonary Disease

## 2021-07-17 ENCOUNTER — Telehealth: Payer: Self-pay | Admitting: Family Medicine

## 2021-07-17 NOTE — Telephone Encounter (Signed)
Ronalee Belts from Triage called and stated patient had LLQ pain for about 4 days now.  Pain is constant, has some nausea, and no vomiting.  He stated she needed to be seen with/in 4 hours.  Looked at a lot of providers schedules for today but no openings.  Per Maudie Mercury ok for patient to wait til tomorrow since it has been going on for 4 days.  Advised patient that I had an opening tomorrow with NP because her PCP does not have any openings.  She declined because she thought if she were to see a NP that she would get "stuck" with them.  Advised that she can go to urgent care or ER, pt declined.  Spoke further with patient and stated that the NP can see you just for this acute visits, which they do to help out when your provider does not have any openings.  She was ok with making visit for tomorrow.  Advised that if pain gets worse that she needs to go to urgent care or ER immediately.

## 2021-07-17 NOTE — Telephone Encounter (Signed)
FYI: pt being seen- With Family Medicine Nance Pear, NP)07/18/2021 at  9:40 AM

## 2021-07-17 NOTE — Telephone Encounter (Signed)
Patient is having serve abdominal pain. Transferred to Triage. But patient would like for [facility] to contact her at her earliest connivance. Please advise.

## 2021-07-18 ENCOUNTER — Other Ambulatory Visit: Payer: Self-pay

## 2021-07-18 ENCOUNTER — Encounter: Payer: Self-pay | Admitting: Family Medicine

## 2021-07-18 ENCOUNTER — Ambulatory Visit: Payer: BC Managed Care – PPO | Admitting: Family

## 2021-07-18 VITALS — BP 128/58 | HR 69 | Temp 98.5°F | Resp 16 | Ht 65.0 in | Wt 170.0 lb

## 2021-07-18 DIAGNOSIS — E039 Hypothyroidism, unspecified: Secondary | ICD-10-CM

## 2021-07-18 DIAGNOSIS — R350 Frequency of micturition: Secondary | ICD-10-CM | POA: Insufficient documentation

## 2021-07-18 DIAGNOSIS — R1032 Left lower quadrant pain: Secondary | ICD-10-CM

## 2021-07-18 LAB — COMPREHENSIVE METABOLIC PANEL
ALT: 12 U/L (ref 0–35)
AST: 14 U/L (ref 0–37)
Albumin: 4.2 g/dL (ref 3.5–5.2)
Alkaline Phosphatase: 78 U/L (ref 39–117)
BUN: 10 mg/dL (ref 6–23)
CO2: 29 mEq/L (ref 19–32)
Calcium: 9.3 mg/dL (ref 8.4–10.5)
Chloride: 103 mEq/L (ref 96–112)
Creatinine, Ser: 0.67 mg/dL (ref 0.40–1.20)
GFR: 92.21 mL/min (ref >60.00)
Glucose, Bld: 111 mg/dL — ABNORMAL HIGH (ref 70–99)
Potassium: 3.9 mEq/L (ref 3.5–5.1)
Sodium: 139 mEq/L (ref 135–145)
Total Bilirubin: 0.5 mg/dL (ref 0.2–1.2)
Total Protein: 6.9 g/dL (ref 6.0–8.3)

## 2021-07-18 LAB — URINALYSIS, ROUTINE W REFLEX MICROSCOPIC
Bilirubin Urine: NEGATIVE
Hgb urine dipstick: NEGATIVE
Ketones, ur: NEGATIVE
Leukocytes,Ua: NEGATIVE
Nitrite: NEGATIVE
RBC / HPF: NONE SEEN (ref 0–?)
Specific Gravity, Urine: 1.02 (ref 1.000–1.030)
Total Protein, Urine: NEGATIVE
Urine Glucose: NEGATIVE
Urobilinogen, UA: 0.2 (ref 0.0–1.0)
WBC, UA: NONE SEEN (ref 0–?)
pH: 6 (ref 5.0–8.0)

## 2021-07-18 LAB — CBC WITH DIFFERENTIAL/PLATELET
Basophils Absolute: 0 10*3/uL (ref 0.0–0.1)
Basophils Relative: 0.6 % (ref 0.0–3.0)
Eosinophils Absolute: 0.3 10*3/uL (ref 0.0–0.7)
Eosinophils Relative: 5.2 % — ABNORMAL HIGH (ref 0.0–5.0)
HCT: 38.2 % (ref 36.0–46.0)
Hemoglobin: 12.8 g/dL (ref 12.0–15.0)
Lymphocytes Relative: 20.7 % (ref 12.0–46.0)
Lymphs Abs: 1.3 10*3/uL (ref 0.7–4.0)
MCHC: 33.4 g/dL (ref 30.0–36.0)
MCV: 92.4 fl (ref 78.0–100.0)
Monocytes Absolute: 0.5 10*3/uL (ref 0.1–1.0)
Monocytes Relative: 7.6 % (ref 3.0–12.0)
Neutro Abs: 4.1 10*3/uL (ref 1.4–7.7)
Neutrophils Relative %: 65.9 % (ref 43.0–77.0)
Platelets: 231 10*3/uL (ref 150.0–400.0)
RBC: 4.13 Mil/uL (ref 3.87–5.11)
RDW: 13.1 % (ref 11.5–15.5)
WBC: 6.2 10*3/uL (ref 4.0–10.5)

## 2021-07-18 LAB — T3, FREE: T3, Free: 3.9 pg/mL (ref 2.3–4.2)

## 2021-07-18 LAB — T4, FREE: Free T4: 0.66 ng/dL (ref 0.60–1.60)

## 2021-07-18 LAB — TSH: TSH: 3.73 u[IU]/mL (ref 0.35–5.50)

## 2021-07-18 MED ORDER — CIPROFLOXACIN HCL 500 MG PO TABS
500.0000 mg | ORAL_TABLET | Freq: Two times a day (BID) | ORAL | 0 refills | Status: DC
Start: 2021-07-18 — End: 2021-07-19

## 2021-07-18 MED ORDER — THYROID 30 MG PO TABS: 75.0000 mg | ORAL_TABLET | Freq: Every day | ORAL | 2 refills | Status: DC

## 2021-07-18 MED ORDER — METRONIDAZOLE 500 MG PO TABS: 500.0000 mg | ORAL_TABLET | Freq: Three times a day (TID) | ORAL | 0 refills | Status: DC

## 2021-07-18 NOTE — Assessment & Plan Note (Signed)
New.  I am concerned about possibility of diverticulitis and also possibility of colon abnormality causing symptoms as well as abnormal cologuard.  Recommended the following:  Stat CMET/CBC Stat CT abd/pelvis with contrast Begin empiric Cipro/flagyl.  Colonoscopy when clinically improved.

## 2021-07-18 NOTE — Patient Instructions (Signed)
Please complete lab work prior to leaving. Complete CT scan in imaging. Go to ER if severe/worsening pain. Begin cipro/flagyl (antibiotics).

## 2021-07-18 NOTE — Progress Notes (Signed)
Subjective:     Patient ID: Amanda [name], female    DOB: 1956-08-21, 65 y.o.   MRN: [id number]  Chief Complaint  Patient presents with   Abdominal Pain    Complains of LLQ pain, this started 07-25-21.     Abdominal Pain   Patient is a 65 yr old female who presents today with chief complaint of LLQ pain. This started on 07/15/21.  Pain was quite bad that day and she also felt constipated.  She reports no BRBPR.  She did recently have a positive cologuard and is in the process of setting up a colonoscopy.  She reports that she feels like she was more constipated that day.  Now moving her bowels normally.  Pain is still there but not as bad. Few brief episodes of nausea. No vomiting. Denies fever.   Notes some urinary urgency, but very little  Health Maintenance Due  Topic Date Due   HIV Screening  Never done   Zoster Vaccines- Shingrix (1 of 2) Never done   PAP SMEAR-Modifier  Never done   COVID-19 Vaccine (3 - Pfizer risk series) 05/12/2020   TETANUS/TDAP  01/20/2021   INFLUENZA VACCINE  Never done    Past Medical History:  Diagnosis Date   Allergic rhinitis    Asthma    Gastroesophageal reflux disease with hiatal hernia    History of shingles    Hyperlipidemia    Hypothyroidism    Osteoarthritis    Overweight(278.02)    Vitamin D deficiency     Past Surgical History:  Procedure Laterality Date   breast reductions  2000   in [place]   left knee arthroscopy  1999   by Dr. Eddie Dibbles   REDUCTION MAMMAPLASTY      No family history on file.  Social History   Socioeconomic History   Marital status: Single    Spouse name: Not on file   Number of children: Not on file   Years of education: Not on file   Highest education level: Not on file  Occupational History   Not on file  Tobacco Use   Smoking status: Never   Smokeless tobacco: Never  Vaping Use   Vaping Use: Never used  Substance and Sexual Activity   Alcohol use: Yes    Comment: social use    Drug use: No   Sexual activity: Not on file  Other Topics Concern   Not on file  Social History Narrative   Not on file   Social Determinants of Health   Financial Resource Strain: Not on file  Food Insecurity: Not on file  Transportation Needs: Not on file  Physical Activity: Not on file  Stress: Not on file  Social Connections: Not on file  Intimate Partner Violence: Not on file    Outpatient Medications Prior to Visit  Medication Sig Dispense Refill   aspirin 81 MG tablet Take 81 mg by mouth daily.      cetirizine (ZYRTEC) 10 MG tablet Take 10 mg by mouth daily as needed.     Cholecalciferol 50 MCG (2000 UT) CAPS Take 6,000 Units by mouth.     EPINEPHrine 0.3 mg/0.3 mL IJ SOAJ injection INJECT 1 PEN INTO THE MUSCLE AS NEEDED FOR ANAPHYLAXIS. 2 each 2   Fluticasone-Salmeterol (ADVAIR DISKUS) 500-50 MCG/DOSE AEPB INHALE 1 PUFF INTO THE LUNGS EVERY 12 HOURS 60 each 5   lisinopril (ZESTRIL) 5 MG tablet Take 1 tablet (5 mg total) by mouth daily. 90 tablet  3   methocarbamol (ROBAXIN) 500 MG tablet Take 1 tablet (500 mg total) by mouth every 8 (eight) hours as needed for muscle spasms. 30 tablet 0   montelukast (SINGULAIR) 10 MG tablet TAKE 1 TABLET BY MOUTH EVERYDAY AT BEDTIME 90 tablet 0   omalizumab (XOLAIR) 150 MG/ML prefilled syringe INJECT 150 MG INTO THE SKIN EVERY 28 (TWENTY-EIGHT) DAYS. 1 mL 11   pantoprazole (PROTONIX) 40 MG tablet TAKE 1 TABLET BY MOUTH TWICE A DAY 180 tablet 2   simvastatin (ZOCOR) 40 MG tablet Take 1 tablet (40 mg total) by mouth daily. 90 tablet 3   thyroid (ARMOUR THYROID) 15 MG tablet Take 1 tablet (15 mg total) by mouth daily. Take with 60 mg = 75 mg total 30 tablet 6   thyroid (ARMOUR THYROID) 60 MG tablet Take 1 tablet (60 mg total) by mouth daily. 90 tablet 3   albuterol (VENTOLIN HFA) 108 (90 Base) MCG/ACT inhaler Inhale 1-2 puffs into the lungs every 4 (four) hours as needed for wheezing or shortness of breath. 8 g 2   No facility-administered  medications prior to visit.    Allergies  Allergen Reactions   Azithromycin     zpak does not work for the pt    Review of Systems  Gastrointestinal:  Positive for abdominal pain.  See HPI    Objective:    Physical Exam Constitutional:      General: She is not in acute distress.    Appearance: Normal appearance. She is well-developed.  HENT:     Head: Normocephalic and atraumatic.     Right Ear: External ear normal.     Left Ear: External ear normal.  Eyes:     General: No scleral icterus. Neck:     Thyroid: No thyromegaly.  Cardiovascular:     Rate and Rhythm: Normal rate and regular rhythm.     Heart sounds: Normal heart sounds. No murmur heard. Pulmonary:     Effort: Pulmonary effort is normal. No respiratory distress.     Breath sounds: Normal breath sounds. No wheezing.  Abdominal:     General: Bowel sounds are decreased.     Palpations: Abdomen is soft. There is no mass.     Tenderness: There is abdominal tenderness in the left upper quadrant and left lower quadrant.  Musculoskeletal:     Cervical back: Neck supple.  Skin:    General: Skin is warm and dry.  Neurological:     Mental Status: She is alert and oriented to person, place, and time.  Psychiatric:        Mood and Affect: Mood normal.        Behavior: Behavior normal.        Thought Content: Thought content normal.        Judgment: Judgment normal.    BP (!) 128/58 (BP Location: Right Arm, Patient Position: Sitting, Cuff Size: Small)   Pulse 69   Temp 98.5 F (36.9 C) (Oral)   Resp 16   Ht 5' 5" (1.651 m)   Wt 170 lb (77.1 kg)   SpO2 99%   BMI 28.29 kg/m  Wt Readings from Last 3 Encounters:  07/18/21 170 lb (77.1 kg)  06/06/21 175 lb (79.4 kg)  01/30/21 177 lb (80.3 kg)       Assessment & Plan:   Problem List Items Addressed This Visit       Unprioritized   Urinary frequency - Primary   Relevant Orders   Urine Culture     Urinalysis, Routine w reflex microscopic   Left lower  quadrant abdominal pain    New.  I am concerned about possibility of diverticulitis and also possibility of colon abnormality causing symptoms as well as abnormal cologuard.  Recommended the following:  Stat CMET/CBC Stat CT abd/pelvis with contrast Begin empiric Cipro/flagyl.  Colonoscopy when clinically improved.       Relevant Orders   CT Abdomen Pelvis W Contrast   Comp Met (CMET)   CBC with Differential/Platelet   Hypothyroidism   Relevant Orders   TSH   T3, free   T4, free    I am having Amanda [name] start on metroNIDAZOLE and ciprofloxacin. I am also having her maintain her cetirizine, aspirin, pantoprazole, Cholecalciferol, Fluticasone-Salmeterol, EPINEPHrine, omalizumab, albuterol, lisinopril, thyroid, simvastatin, methocarbamol, thyroid, and montelukast.  Meds ordered this encounter  Medications   metroNIDAZOLE (FLAGYL) 500 MG tablet    Sig: Take 1 tablet (500 mg total) by mouth 3 (three) times daily for 7 days.    Dispense:  21 tablet    Refill:  0    Order Specific Question:   Supervising Provider    Answer:   BLYTH, STACEY A [[id number]]   ciprofloxacin (CIPRO) 500 MG tablet    Sig: Take 1 tablet (500 mg total) by mouth 2 (two) times daily for 3 days.    Dispense:  14 tablet    Refill:  0    Order Specific Question:   Supervising Provider    Answer:   BLYTH, STACEY A [[id number]]    

## 2021-07-18 NOTE — Addendum Note (Signed)
Addended by: Lamar Blinks C on: 07/18/2021 06:18 PM   Modules accepted: Orders

## 2021-07-19 ENCOUNTER — Telehealth: Payer: Self-pay | Admitting: Family Medicine

## 2021-07-19 ENCOUNTER — Encounter: Payer: Self-pay | Admitting: Family Medicine

## 2021-07-19 ENCOUNTER — Ambulatory Visit: Payer: BC Managed Care – PPO

## 2021-07-19 ENCOUNTER — Ambulatory Visit (HOSPITAL_BASED_OUTPATIENT_CLINIC_OR_DEPARTMENT_OTHER)
Admission: RE | Admit: 2021-07-19 | Discharge: 2021-07-19 | Disposition: A | Discharge status: Home / Self Care | Payer: BC Managed Care – PPO | Source: Ambulatory Visit | Attending: Family | Admitting: Family

## 2021-07-19 ENCOUNTER — Encounter (HOSPITAL_BASED_OUTPATIENT_CLINIC_OR_DEPARTMENT_OTHER): Payer: Self-pay

## 2021-07-19 DIAGNOSIS — R1032 Left lower quadrant pain: Secondary | ICD-10-CM | POA: Insufficient documentation

## 2021-07-19 DIAGNOSIS — K4020 Bilateral inguinal hernia, without obstruction or gangrene, not specified as recurrent: Secondary | ICD-10-CM | POA: Diagnosis not present

## 2021-07-19 DIAGNOSIS — N281 Cyst of kidney, acquired: Secondary | ICD-10-CM | POA: Diagnosis not present

## 2021-07-19 DIAGNOSIS — K5792 Diverticulitis of intestine, part unspecified, without perforation or abscess without bleeding: Secondary | ICD-10-CM

## 2021-07-19 DIAGNOSIS — I700 Atherosclerosis of aorta: Secondary | ICD-10-CM | POA: Diagnosis not present

## 2021-07-19 DIAGNOSIS — K6389 Other specified diseases of intestine: Secondary | ICD-10-CM | POA: Diagnosis not present

## 2021-07-19 LAB — URINE CULTURE
MICRO NUMBER:: 12493499
SPECIMEN QUALITY:: ADEQUATE

## 2021-07-19 MED ORDER — AMOXICILLIN-POT CLAVULANATE 875-125 MG PO TABS: 1.0000 | ORAL_TABLET | Freq: Two times a day (BID) | ORAL | 0 refills | Status: DC

## 2021-07-19 MED ORDER — IOHEXOL 300 MG/ML  SOLN
100.0000 mL | Freq: Once | INTRAMUSCULAR | Status: AC | PRN
  Administered 2021-07-19: 100 mL via INTRAVENOUS

## 2021-07-19 NOTE — Telephone Encounter (Signed)
[facility]: [phone]  Call report: CT of abdomen and pelvis compatible with acute uncomplicated diverticulitis

## 2021-07-19 NOTE — Telephone Encounter (Signed)
CT report.

## 2021-07-23 ENCOUNTER — Encounter: Payer: Self-pay | Admitting: Family Medicine

## 2021-08-03 ENCOUNTER — Other Ambulatory Visit: Payer: Self-pay | Admitting: Pulmonary Disease

## 2021-08-03 ENCOUNTER — Other Ambulatory Visit (HOSPITAL_COMMUNITY): Payer: Self-pay

## 2021-08-07 ENCOUNTER — Other Ambulatory Visit (HOSPITAL_COMMUNITY): Payer: Self-pay

## 2021-08-07 ENCOUNTER — Other Ambulatory Visit: Payer: Self-pay | Admitting: Pulmonary Disease

## 2021-08-07 DIAGNOSIS — Z7952 Long term (current) use of systemic steroids: Secondary | ICD-10-CM

## 2021-08-07 DIAGNOSIS — J4550 Severe persistent asthma, uncomplicated: Secondary | ICD-10-CM

## 2021-08-08 ENCOUNTER — Other Ambulatory Visit (HOSPITAL_COMMUNITY): Payer: Self-pay

## 2021-08-08 ENCOUNTER — Telehealth: Payer: Self-pay

## 2021-08-08 MED ORDER — OMALIZUMAB 150 MG/ML ~~LOC~~ SOSY
150.0000 mg | PREFILLED_SYRINGE | SUBCUTANEOUS | 2 refills | Status: DC
  Filled 2021-08-08 – 2021-08-20 (×3): qty 1, 28d supply, fill #0
  Filled 2021-09-05: qty 1, 28d supply, fill #1
  Filled 2021-10-09: qty 1, 28d supply, fill #2

## 2021-08-08 NOTE — Telephone Encounter (Signed)
Refill sent for [facility] for 3 months only to [facility]  Dose: 150mg  mg every 4 weeks  Last OV: 01/30/21 Provider: Dr. Loanne Drilling  Next OV: not yet scheduled but Dr. Loanne Drilling wanted to see her back in 6 months  Knox Saliva, PharmD, MPH, BCPS Clinical Pharmacist (Rheumatology and Pulmonology)

## 2021-08-08 NOTE — Telephone Encounter (Signed)
Submitted a Prior Authorization request to [employer] for [employer] via CoverMyMeds. Will update once we receive a response.   Key: Mickle Plumb

## 2021-08-08 NOTE — Telephone Encounter (Signed)
Patient needs to be scheduled for follow-up with me in November or December. No recall was placed on prior visit. She is on a biologic and needs follow-up every 3-6 months.

## 2021-08-10 ENCOUNTER — Telehealth: Payer: Self-pay

## 2021-08-10 NOTE — Telephone Encounter (Signed)
Contacted patient to schedule appt with Dr. Loanne Drilling for Nov or Dec, MyChart message also sent

## 2021-08-13 ENCOUNTER — Other Ambulatory Visit (HOSPITAL_COMMUNITY): Payer: Self-pay

## 2021-08-13 ENCOUNTER — Other Ambulatory Visit: Payer: Self-pay

## 2021-08-14 ENCOUNTER — Ambulatory Visit: Payer: BC Managed Care – PPO | Admitting: Gastroenterology

## 2021-08-14 ENCOUNTER — Encounter: Payer: Self-pay | Admitting: Gastroenterology

## 2021-08-14 VITALS — BP 127/70 | HR 68 | Ht 65.0 in | Wt 169.2 lb

## 2021-08-14 DIAGNOSIS — R195 Other fecal abnormalities: Secondary | ICD-10-CM

## 2021-08-14 MED ORDER — SUTAB 1479-225-188 MG PO TABS: ORAL_TABLET | ORAL | 0 refills | Status: DC

## 2021-08-14 NOTE — Progress Notes (Signed)
[facility] Consult Note:  History: Amanda [name] 08/14/2021  Referring provider: Darreld Mclean, MD  Reason for consult/chief complaint: Colon Cancer Screening (+ Positive Cologuard- denies any issues at this time. Pt states that she was sick the week she did her cologuard test and thinks that may have contributed to test being positive.  Pt would prefer to have sutab  for preparation. )   Subjective  HPI:  This is a pleasant 65 year old woman referred for positive Cologuard.  She recalls having had a negative Cologuard test at some point in the past, and has never had previous diagnostic or screening colonoscopy.  No known family history of colorectal cancer (Limited family history available on her mother side since her mother is adopted). Intermittent constipation, denies rectal bleeding.  She recently had acute abdominal pain and was found to have uncomplicated left-sided diverticulitis (see below).  She started feeling better within a couple of days of treatment and feels that it is now completely resolved.  The positive Cologuard occurred prior to that diverticulitis episode. Intermittent regurgitation or pyrosis, denies dysphagia, odynophagia, nausea, vomiting, early satiety or weight loss.  ROS:  Review of Systems   Past Medical History: Past Medical History:  Diagnosis Date   Allergic rhinitis    Asthma    Diverticulitis    Gastroesophageal reflux disease with hiatal hernia    History of shingles    Hyperlipidemia    Hypothyroidism    Osteoarthritis    Overweight(278.02)    Vitamin D deficiency      Past Surgical History: Past Surgical History:  Procedure Laterality Date   breast reductions  2000   in [place]   COLONOSCOPY     left knee arthroscopy  1999   by Dr. Eddie Dibbles   REDUCTION MAMMAPLASTY       Family History: Family History  Problem Relation Age of Onset   Lung cancer Sister    Heart disease Maternal Grandfather      Social History: Social History   Socioeconomic History   Marital status: Single    Spouse name: Not on file   Number of children: Not on file   Years of education: Not on file   Highest education level: Not on file  Occupational History   Not on file  Tobacco Use   Smoking status: Never   Smokeless tobacco: Never  Vaping Use   Vaping Use: Never used  Substance and Sexual Activity   Alcohol use: Yes    Comment: social use   Drug use: No   Sexual activity: Not on file  Other Topics Concern   Not on file  Social History Narrative   Not on file   Social Determinants of Health   Financial Resource Strain: Not on file  Food Insecurity: Not on file  Transportation Needs: Not on file  Physical Activity: Not on file  Stress: Not on file  Social Connections: Not on file    Allergies: Allergies  Allergen Reactions   Azithromycin     zpak does not work for the pt    Outpatient Meds: Current Outpatient Medications  Medication Sig Dispense Refill   ARMOUR THYROID 15 MG tablet Take 15 mg by mouth daily.     aspirin 81 MG tablet Take 81 mg by mouth daily.      cetirizine (ZYRTEC) 10 MG tablet Take 10 mg by mouth daily as needed.     Cholecalciferol 50 MCG (2000 UT) CAPS Take 6,000 Units  by mouth.     EPINEPHrine 0.3 mg/0.3 mL IJ SOAJ injection INJECT 1 PEN INTO THE MUSCLE AS NEEDED FOR ANAPHYLAXIS. 2 each 2   Fluticasone-Salmeterol (ADVAIR DISKUS) 500-50 MCG/DOSE AEPB INHALE 1 PUFF INTO THE LUNGS EVERY 12 HOURS 60 each 5   lisinopril (ZESTRIL) 5 MG tablet Take 1 tablet (5 mg total) by mouth daily. 90 tablet 3   montelukast (SINGULAIR) 10 MG tablet TAKE 1 TABLET BY MOUTH EVERYDAY AT BEDTIME 90 tablet 0   omalizumab (XOLAIR) 150 MG/ML prefilled syringe Inject 150 mg into the skin every 28 (twenty-eight) days. 1 mL 2   pantoprazole (PROTONIX) 40 MG tablet TAKE 1 TABLET BY MOUTH TWICE A DAY 180 tablet 2   simvastatin (ZOCOR) 40 MG tablet Take 1 tablet (40 mg total) by mouth  daily. 90 tablet 3   Sodium Sulfate-Mag Sulfate-KCl (SUTAB) [phone] MG TABS Use the kit as directed by your physician 24 tablet 0   thyroid (ARMOUR THYROID) 30 MG tablet Take 2.5 tablets (75 mg total) by mouth daily before breakfast. 225 tablet 2   No current facility-administered medications for this visit.      ___________________________________________________________________ Objective   Exam:  BP 127/70   Pulse 68   Ht _0  (1.651 m)   Wt 169 lb 3.2 oz (76.7 kg)   SpO2 98%   BMI 28.16 kg/m  Wt Readings from Last 3 Encounters:  08/14/21 169 lb 3.2 oz (76.7 kg)  07/18/21 170 lb (77.1 kg)  06/06/21 175 lb (79.4 kg)    General: Well-appearing Eyes: sclera anicteric, no redness ENT: oral mucosa moist without lesions, no cervical or supraclavicular lymphadenopathy CV: RRR without murmur, S1/S2, no JVD, no peripheral edema Resp: clear to auscultation bilaterally, normal RR and effort noted GI: soft, no tenderness, with active bowel sounds. No guarding or palpable organomegaly noted. Skin; warm and dry, no rash or jaundice noted Neuro: awake, alert and oriented x 3. Normal gross motor function and fluent speech  Labs:  Positive Cologuard 06/29/2021  CBC Latest Ref Rng & Units 07/18/2021 06/06/2021 11/11/2019  WBC 4.0 - 10.5 K/uL 6.2 7.9 6.4  Hemoglobin 12.0 - 15.0 g/dL 12.8 13.1 13.5  Hematocrit 36.0 - 46.0 % 38.2 39.0 40.5  Platelets 150.0 - 400.0 K/uL 231.0 240.0 258.0   CMP Latest Ref Rng & Units 07/18/2021 06/06/2021 11/11/2019  Glucose 70 - 99 mg/dL 111(H) 92 108(H)  BUN 6 - 23 mg/dL _1 Creatinine 0.40 - 1.20 mg/dL 0.67 0.76 0.77  Sodium 135 - 145 mEq/L 139 140 139  Potassium 3.5 - 5.1 mEq/L 3.9 4.0 4.1  Chloride 96 - 112 mEq/L 103 101 103  CO2 19 - 32 mEq/L _2 Calcium 8.4 - 10.5 mg/dL 9.3 9.4 9.4  Total Protein 6.0 - 8.3 g/dL 6.9 6.5 6.4  Total Bilirubin 0.2 - 1.2 mg/dL 0.5 0.4 0.5  Alkaline Phos 39 - 117 U/L 78 84 86  AST 0 - 37 U/L _3 ALT 0 - 35 U/L _4 Radiologic Studies:  CLINICAL DATA:  Left lower quadrant pain   EXAM: CT ABDOMEN AND PELVIS WITH CONTRAST   TECHNIQUE: Multidetector CT imaging of the abdomen and pelvis was performed using the standard protocol following bolus administration of intravenous contrast.   CONTRAST:  119m OMNIPAQUE IOHEXOL 300 MG/ML  SOLN   COMPARISON:  CT abdomen and pelvis dated September 08, 2008   FINDINGS: Lower chest: No acute  abnormality.   Hepatobiliary: No focal liver abnormality is seen. No gallstones, gallbladder wall thickening, or biliary dilatation.   Pancreas: Unremarkable. No pancreatic ductal dilatation or surrounding inflammatory changes.   Spleen: Normal in size without focal abnormality.   Adrenals/Urinary Tract: Bilateral adrenal glands are unremarkable. No hydronephrosis or nephrolithiasis. Simple cyst of the interpolar region of the left kidney. Bladder is unremarkable for degree of decompression.   Stomach/Bowel: Wall thickening and fat stranding of the proximal left colon with associated inflamed diverticula. Normal stomach. Normal appendix. No evidence of obstruction.   Vascular/Lymphatic: Aortic atherosclerosis. No enlarged abdominal or pelvic lymph nodes.   Reproductive: Uterus and bilateral adnexa are unremarkable.   Other: Small fat containing bilateral inguinal hernias.   Musculoskeletal: No acute or significant osseous findings.   IMPRESSION: Findings compatible with acute uncomplicated diverticulitis of the proximal left colon.   Aortic Atherosclerosis (ICD10-I70.0).   These results will be called to the ordering clinician or representative by the Radiologist Assistant, and communication documented in the PACS or [employer].     Electronically Signed   By: Yetta Glassman M.D.   On: 07/19/2021 13:50   Assessment: Encounter Diagnosis  Name Primary?   Positive colorectal cancer screening using Cologuard  test Yes    We discussed the possible explanations for this test result, 1 or more precancerous colon polyps, false positive, colon cancer.  Colonoscopy is recommended and she was agreeable.  We had a thorough discussion about the nature of the procedure showing diagrams of the anatomy and also discussing risks and benefits.  The benefits and risks of the planned procedure were described in detail with the patient or (when appropriate) their health care proxy.  Risks were outlined as including, but not limited to, bleeding, infection, perforation, adverse medication reaction leading to cardiac or pulmonary decompensation, pancreatitis (if ERCP).  The limitation of incomplete mucosal visualization was also discussed.  No guarantees or warranties were given. Amanda [name] tells me she does not believe she can drink bowel prep solution, and requested Sutab.  Advised to drink extra fluids with that to ensure tolerance and effectiveness).  Thank you for the courtesy of this consult.  Please call me with any questions or concerns.  Nelida Meuse III  CC: Referring provider noted above

## 2021-08-14 NOTE — Patient Instructions (Addendum)
If you are age 64 or older, your body mass index should be between 23-30. Your Body mass index is 28.16 kg/m. If this is out of the aforementioned range listed, please consider follow up with your Primary Care Provider.  If you are age 35 or younger, your body mass index should be between 19-25. Your Body mass index is 28.16 kg/m. If this is out of the aformentioned range listed, please consider follow up with your Primary Care Provider.   ________________________________________________________  The [facility] providers would like to encourage you to use [facility] to communicate with providers for non-urgent requests or questions.  Due to long hold times on the telephone, sending your provider a message by [facility] may be a faster and more efficient way to get a response.  Please allow 48 business hours for a response.  Please remember that this is for non-urgent requests.  _______________________________________________________  Dennis Bast have been scheduled for a colonoscopy. Please follow written instructions given to you at your visit today.  Please pick up your prep supplies at the pharmacy within the next 1-3 days. If you use inhalers (even only as needed), please bring them with you on the day of your procedure.  Due to recent changes in healthcare laws, you may see the results of your imaging and laboratory studies on MyChart before your provider has had a chance to review them.  We understand that in some cases there may be results that are confusing or concerning to you. Not all laboratory results come back in the same time frame and the provider may be waiting for multiple results in order to interpret others.  Please give [place] 48 hours in order for your provider to thoroughly review all the results before contacting the office for clarification of your results.   It was a pleasure to see you today!  Thank you for trusting me with your gastrointestinal care!

## 2021-08-17 ENCOUNTER — Other Ambulatory Visit (HOSPITAL_COMMUNITY): Payer: Self-pay

## 2021-08-20 ENCOUNTER — Other Ambulatory Visit: Payer: Self-pay

## 2021-08-20 ENCOUNTER — Other Ambulatory Visit (HOSPITAL_COMMUNITY): Payer: Self-pay

## 2021-08-20 NOTE — Telephone Encounter (Signed)
Received notification from [facility] regarding a prior authorization for XOLAIR. Authorization has been APPROVED from 08/08/2021 to 08/07/2022.   Per test claim, copay for 28 days supply is $100.00. Copay card on file rejects and requests that company be contacted.  Patient can continue to fill through [place]: [phone]   Authorization # [place]   Will f/u regarding copay card. Phone# [phone] Current ID: [id number]

## 2021-08-21 MED ORDER — MONTELUKAST SODIUM 10 MG PO TABS: 10.0000 mg | ORAL_TABLET | Freq: Every day | ORAL | 1 refills | Status: DC

## 2021-08-27 ENCOUNTER — Other Ambulatory Visit (HOSPITAL_COMMUNITY): Payer: Self-pay

## 2021-09-03 ENCOUNTER — Other Ambulatory Visit (HOSPITAL_COMMUNITY): Payer: Self-pay

## 2021-09-05 ENCOUNTER — Encounter: Payer: Self-pay | Admitting: Family Medicine

## 2021-09-05 ENCOUNTER — Other Ambulatory Visit (HOSPITAL_COMMUNITY): Payer: Self-pay

## 2021-09-05 DIAGNOSIS — E782 Mixed hyperlipidemia: Secondary | ICD-10-CM

## 2021-09-05 MED ORDER — SIMVASTATIN 40 MG PO TABS: 40.0000 mg | ORAL_TABLET | Freq: Every day | ORAL | 3 refills | Status: DC

## 2021-09-06 NOTE — Telephone Encounter (Signed)
Please re-attempt to contact patient to schedule appointment for December. She is on Xolair has not been seen in 6 months.

## 2021-09-07 NOTE — Telephone Encounter (Signed)
Appt for 10/25/21 has been schedule

## 2021-09-11 ENCOUNTER — Other Ambulatory Visit (HOSPITAL_COMMUNITY): Payer: Self-pay

## 2021-10-04 ENCOUNTER — Other Ambulatory Visit (HOSPITAL_COMMUNITY): Payer: Self-pay

## 2021-10-08 ENCOUNTER — Other Ambulatory Visit (HOSPITAL_COMMUNITY): Payer: Self-pay

## 2021-10-09 ENCOUNTER — Other Ambulatory Visit (HOSPITAL_COMMUNITY): Payer: Self-pay

## 2021-10-22 ENCOUNTER — Encounter: Payer: Self-pay | Admitting: Gastroenterology

## 2021-10-22 ENCOUNTER — Ambulatory Visit (AMBULATORY_SURGERY_CENTER): Payer: BC Managed Care – PPO | Admitting: Gastroenterology

## 2021-10-22 ENCOUNTER — Encounter: Payer: Self-pay | Admitting: Family Medicine

## 2021-10-22 ENCOUNTER — Other Ambulatory Visit: Payer: Self-pay

## 2021-10-22 VITALS — BP 156/66 | HR 59 | Temp 97.3°F | Resp 18 | Ht 65.0 in | Wt 169.0 lb

## 2021-10-22 DIAGNOSIS — D122 Benign neoplasm of ascending colon: Secondary | ICD-10-CM

## 2021-10-22 DIAGNOSIS — E039 Hypothyroidism, unspecified: Secondary | ICD-10-CM

## 2021-10-22 DIAGNOSIS — K5730 Diverticulosis of large intestine without perforation or abscess without bleeding: Secondary | ICD-10-CM

## 2021-10-22 DIAGNOSIS — R195 Other fecal abnormalities: Secondary | ICD-10-CM | POA: Diagnosis not present

## 2021-10-22 MED ORDER — SODIUM CHLORIDE 0.9 % IV SOLN: 500.0000 mL | Freq: Once | INTRAVENOUS | Status: DC

## 2021-10-22 NOTE — Progress Notes (Signed)
Called to room to assist during endoscopic procedure.  Patient ID and intended procedure confirmed with present staff. Received instructions for my participation in the procedure from the performing physician.  

## 2021-10-22 NOTE — Progress Notes (Signed)
History and Physical:  This patient presents for endoscopic testing for: Encounter Diagnosis  Name Primary?   Positive colorectal cancer screening using Cologuard test Yes    Office visit 08/14/21 with clinical details Positive cologuard without symptoms Diverticulitis several months ago. Currently feels well without abdominal pain or altered bowel habits.  ROS: Patient denies chest pain or cough   Past Medical History: Past Medical History:  Diagnosis Date   Allergic rhinitis    Asthma    Diverticulitis    Gastroesophageal reflux disease with hiatal hernia    History of shingles    Hyperlipidemia    Hypothyroidism    Osteoarthritis    Overweight(278.02)    Vitamin D deficiency      Past Surgical History: Past Surgical History:  Procedure Laterality Date   breast reductions  2000   in [place]   COLONOSCOPY     left knee arthroscopy  1999   by Dr. Eddie Dibbles   REDUCTION MAMMAPLASTY      Allergies: Allergies  Allergen Reactions   Azithromycin     zpak does not work for the pt    Outpatient Meds: Current Outpatient Medications  Medication Sig Dispense Refill   ARMOUR THYROID 15 MG tablet Take 15 mg by mouth daily.     aspirin 81 MG tablet Take 81 mg by mouth daily.      cetirizine (ZYRTEC) 10 MG tablet Take 10 mg by mouth daily as needed.     Cholecalciferol 50 MCG (2000 UT) CAPS Take 6,000 Units by mouth.     Fluticasone-Salmeterol (ADVAIR DISKUS) 500-50 MCG/DOSE AEPB INHALE 1 PUFF INTO THE LUNGS EVERY 12 HOURS 60 each 5   lisinopril (ZESTRIL) 5 MG tablet Take 1 tablet (5 mg total) by mouth daily. 90 tablet 3   montelukast (SINGULAIR) 10 MG tablet Take 1 tablet (10 mg total) by mouth at bedtime. 90 tablet 1   omalizumab (XOLAIR) 150 MG/ML prefilled syringe Inject 150 mg into the skin every 28 (twenty-eight) days. 1 mL 2   pantoprazole (PROTONIX) 40 MG tablet TAKE 1 TABLET BY MOUTH TWICE A DAY 180 tablet 2   simvastatin (ZOCOR) 40 MG tablet Take 1 tablet (40 mg  total) by mouth daily. 90 tablet 3   thyroid (ARMOUR THYROID) 30 MG tablet Take 2.5 tablets (75 mg total) by mouth daily before breakfast. 225 tablet 2   EPINEPHrine 0.3 mg/0.3 mL IJ SOAJ injection INJECT 1 PEN INTO THE MUSCLE AS NEEDED FOR ANAPHYLAXIS. 2 each 2   Current Facility-Administered Medications  Medication Dose Route Frequency Provider Last Rate Last Admin   0.9 %  sodium chloride infusion  500 mL Intravenous Once Danis, Kirke Corin, MD          ___________________________________________________________________ Objective   Exam:  BP (!) 154/70    Pulse 74    Temp (!) 97.3 F (36.3 C)    Ht 5\' 5"  (1.651 m)    Wt 169 lb (76.7 kg)    SpO2 99%    BMI 28.12 kg/m   CV: RRR without murmur, S1/S2 Resp: clear to auscultation bilaterally, normal RR and effort noted GI: soft, no tenderness, with active bowel sounds.   Assessment: Encounter Diagnosis  Name Primary?   Positive colorectal cancer screening using Cologuard test Yes     Plan: Colonoscopy  The benefits and risks of the planned procedure were described in detail with the patient or (when appropriate) their health care proxy.  Risks were outlined as including, but  not limited to, bleeding, infection, perforation, adverse medication reaction leading to cardiac or pulmonary decompensation, pancreatitis (if ERCP).  The limitation of incomplete mucosal visualization was also discussed.  No guarantees or warranties were given.    The patient is appropriate for an endoscopic procedure in the ambulatory setting.   - Wilfrid Lund, MD

## 2021-10-22 NOTE — Op Note (Signed)
[facility] Patient Name: Amanda [name] Procedure Date: 10/22/2021 10:51 AM MRN: [id number] Endoscopist: [place]. Loletha Carrow , MD Age: 66 Referring MD:  Date of Birth: Sep 28, 1956 Gender: Female Account #: [id number] Procedure:                Colonoscopy Indications:              Positive Cologuard test (which was patient's first                            CRC screening test) Medicines:                Monitored Anesthesia Care Procedure:                Pre-Anesthesia Assessment:                           - Prior to the procedure, a History and Physical                            was performed, and patient medications and                            allergies were reviewed. The patient's tolerance of                            previous anesthesia was also reviewed. The risks                            and benefits of the procedure and the sedation                            options and risks were discussed with the patient.                            All questions were answered, and informed consent                            was obtained. Prior Anticoagulants: The patient has                            taken no previous anticoagulant or antiplatelet                            agents. ASA Grade Assessment: II - A patient with                            mild systemic disease. After reviewing the risks                            and benefits, the patient was deemed in                            satisfactory condition to undergo the procedure.  After obtaining informed consent, the colonoscope                            was passed under direct vision. Throughout the                            procedure, the patient's blood pressure, pulse, and                            oxygen saturations were monitored continuously. The                            CF [id number] #[id number] was introduced through the anus                            and advanced to the the terminal  ileum, with                            identification of the appendiceal orifice and IC                            valve. The colonoscopy was performed without                            difficulty. The patient tolerated the procedure                            well. The quality of the bowel preparation was                            excellent. The terminal ileum, ileocecal valve,                            appendiceal orifice, and rectum were photographed. Scope In: 10:55:19 AM Scope Out: 11:10:32 AM Scope Withdrawal Time: 0 hours 11 minutes 55 seconds  Total Procedure Duration: 0 hours 15 minutes 13 seconds  Findings:                 The perianal and digital rectal examinations were                            normal.                           The terminal ileum appeared normal.                           Repeat examination of right colon under NBI                            performed.                           A diminutive polyp was found in the ascending  colon. The polyp was semi-sessile. The polyp was                            removed with a cold snare. Resection and retrieval                            were complete.                           Multiple diverticula were found in the left colon.                           The exam was otherwise without abnormality on                            direct and retroflexion views. Complications:            No immediate complications. Estimated Blood Loss:     Estimated blood loss was minimal. Impression:               - The examined portion of the ileum was normal.                           - One diminutive polyp in the ascending colon,                            removed with a cold snare. Resected and retrieved.                           - Diverticulosis in the left colon.                           - The examination was otherwise normal on direct                            and retroflexion views. Recommendation:            - Patient has a contact number available for                            emergencies. The signs and symptoms of potential                            delayed complications were discussed with the                            patient. Return to normal activities tomorrow.                            Written discharge instructions were provided to the                            patient.                           - Resume previous diet.                           -  Continue present medications.                           - Await pathology results.                           - Repeat colonoscopy is recommended for                            surveillance. The colonoscopy date will be                            determined after pathology results from today's                            exam become available for review. Jorrell Kuster L. Loletha Carrow, MD 10/22/2021 11:15:57 AM This report has been signed electronically.

## 2021-10-22 NOTE — Progress Notes (Signed)
Report to PACU, RN, vss, BBS= Clear.  

## 2021-10-22 NOTE — Patient Instructions (Signed)
Read all of the handouts given to you by your recovery room nurse.  YOU HAD AN ENDOSCOPIC PROCEDURE TODAY AT THE [facility]:   Refer to the procedure report that was given to you for any specific questions about what was found during the examination.  If the procedure report does not answer your questions, please call your gastroenterologist to clarify.  If you requested that your care partner not be given the details of your procedure findings, then the procedure report has been included in a sealed envelope for you to review at your convenience later.  YOU SHOULD EXPECT: Some feelings of bloating in the abdomen. Passage of more gas than usual.  Walking can help get rid of the air that was put into your GI tract during the procedure and reduce the bloating. If you had a lower endoscopy (such as a colonoscopy or flexible sigmoidoscopy) you may notice spotting of blood in your stool or on the toilet paper. If you underwent a bowel prep for your procedure, you may not have a normal bowel movement for a few days.  Please Note:  You might notice some irritation and congestion in your nose or some drainage.  This is from the oxygen used during your procedure.  There is no need for concern and it should clear up in a day or so.  SYMPTOMS TO REPORT IMMEDIATELY:  Following lower endoscopy (colonoscopy or flexible sigmoidoscopy):  Excessive amounts of blood in the stool  Significant tenderness or worsening of abdominal pains  Swelling of the abdomen that is new, acute  Fever of 100F or higher   For urgent or emergent issues, a gastroenterologist can be reached at any hour by calling [phone]. Do not use MyChart messaging for urgent concerns.    DIET:  We do recommend a small meal at first, but then you may proceed to your regular diet.  Drink plenty of fluids but you should avoid alcoholic beverages for 24 hours. Try to increase the fiber in your diet, and drink plenty of  water.  ACTIVITY:  You should plan to take it easy for the rest of today and you should NOT DRIVE or use heavy machinery until tomorrow (because of the sedation medicines used during the test).    FOLLOW UP: Our staff will call the number listed on your records 48-72 hours following your procedure to check on you and address any questions or concerns that you may have regarding the information given to you following your procedure. If we do not reach you, we will leave a message.  We will attempt to reach you two times.  During this call, we will ask if you have developed any symptoms of COVID 19. If you develop any symptoms (ie: fever, flu-like symptoms, shortness of breath, cough etc.) before then, please call [phone].  If you test positive for Covid 19 in the 2 weeks post procedure, please call and report this information to us.    If any biopsies were taken you will be contacted by phone or by letter within the next 1-3 weeks.  Please call us at [phone] if you have not heard about the biopsies in 3 weeks.    SIGNATURES/CONFIDENTIALITY: You and/or your care partner have signed paperwork which will be entered into your electronic medical record.  These signatures attest to the fact that that the information above on your After Visit Summary has been reviewed and is understood.  Full responsibility of the confidentiality of this   discharge information lies with you and/or your care-partner.  

## 2021-10-23 MED ORDER — ARMOUR THYROID 15 MG PO TABS: 15.0000 mg | ORAL_TABLET | Freq: Every day | ORAL | 3 refills | Status: DC

## 2021-10-23 MED ORDER — THYROID 60 MG PO TABS: 60.0000 mg | ORAL_TABLET | Freq: Every day | ORAL | 3 refills | Status: DC

## 2021-10-24 ENCOUNTER — Telehealth: Payer: Self-pay

## 2021-10-24 NOTE — Telephone Encounter (Signed)
°  Follow up Call-  Call back number 10/22/2021  Post procedure Call Back phone  # [phone]  Permission to leave phone message Yes  Some recent data might be hidden     Patient questions:  Do you have a fever, pain , or abdominal swelling? No. Pain Score  0 *  Have you tolerated food without any problems? Yes.    Have you been able to return to your normal activities? Yes.    Do you have any questions about your discharge instructions: Diet   No. Medications  No. Follow up visit  No.  Do you have questions or concerns about your Care? No.  Actions: * If pain score is 4 or above: No action needed, pain <4.

## 2021-10-25 ENCOUNTER — Encounter: Payer: Self-pay | Admitting: Gastroenterology

## 2021-10-25 ENCOUNTER — Other Ambulatory Visit: Payer: Self-pay

## 2021-10-25 ENCOUNTER — Ambulatory Visit: Payer: BC Managed Care – PPO | Admitting: Pulmonary Disease

## 2021-10-25 ENCOUNTER — Encounter: Payer: Self-pay | Admitting: Pulmonary Disease

## 2021-10-25 ENCOUNTER — Telehealth: Payer: Self-pay | Admitting: Pulmonary Disease

## 2021-10-25 DIAGNOSIS — J4550 Severe persistent asthma, uncomplicated: Secondary | ICD-10-CM | POA: Diagnosis not present

## 2021-10-25 DIAGNOSIS — Z7952 Long term (current) use of systemic steroids: Secondary | ICD-10-CM | POA: Diagnosis not present

## 2021-10-25 MED ORDER — FLUTICASONE-SALMETEROL 500-50 MCG/ACT IN AEPB: 1.0000 | INHALATION_SPRAY | Freq: Two times a day (BID) | RESPIRATORY_TRACT | 11 refills | Status: DC

## 2021-10-25 MED ORDER — MONTELUKAST SODIUM 10 MG PO TABS: 10.0000 mg | ORAL_TABLET | Freq: Every day | ORAL | 1 refills | Status: DC

## 2021-10-25 NOTE — Patient Instructions (Addendum)
Severe persistent asthma - well-controlled CONTINUE Xolair monthly at home. Coordinate with pharmacy team for refill  CONTINUE Advair 500-50 mcg daily. This is your EVERYDAY inhaler. REFILL CONTINUE Albuterol inhaler as needed. This is your rescue inhaler CONTINUE singulair 10 mg daily. REFILL CONTINUE Zyrtec 10 mg daily  Asthma Action Plan Increase Advair to ONE puff twice a day for worsening shortness of breath, wheezing and cough. If you symptoms do not improve in 24-48 hours, please our office for evaluation and/or prednisone taper.  Follow-up with me in 6 months

## 2021-10-25 NOTE — Progress Notes (Signed)
Subjective:   PATIENT ID: Amanda [name] GENDER: female DOB: 1956/03/29, MRN: [id number]   HPI  Chief Complaint  Patient presents with   Follow-up    Asthma  Refill needed on Advair has a hard stop, please review    Reason for Visit: Follow-up asthma  [name] is a 66 year old never smoker with severe persistent asthma with steroid dependence currently on Xolair, seasonal allergic rhinitis who present for follow-up for asthma  She was previously a patient of Dr. Lenna Gilford, whom she followed for many years. Last seen in October 2019 with stable asthma symptoms during that visit. Symptoms are triggered by exertion, illness, strong odors and allergies. Denies nocturnal symptoms.  She reports she overall well controlled on Xolair and Advair 500-50 mcg once a day. She has had to increased Advair to twice a day when she exerts herself more and during changes of seasons. Denies any exacerbation since our last visit. Last week had a cold that self-resolved after a few days. Still has residual cough that is aggravated by pollen. Has been >2 years since needing steroids.  10/25/21 She reports her symptoms are well-controlled Xolair and Advair 500-50 mcg once a day. During the spring and winter she may need her inhaler twice a day for worsening symptoms. Has had to use albuterol twice a week. She notices worsening symptoms with wheezing and shortness of breath when she is nearing the time to re-dose her biologic and Advair. Has congestion triggered by cold weather. Since our last visit she had a sinus infection in December that was relieved with OTC meds, no antibiotics needed.   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 Take with 60 mg to equal 75 mg total 90 tablet 3   aspirin 81 MG tablet Take 81 mg by mouth daily.      cetirizine (ZYRTEC) 10 MG tablet Take 10 mg by mouth daily as needed.     Cholecalciferol 50 MCG (2000 UT) CAPS Take 6,000 Units by mouth.     EPINEPHrine 0.3 mg/0.3 mL IJ SOAJ injection INJECT 1 PEN INTO THE MUSCLE AS NEEDED FOR ANAPHYLAXIS. 2 each 2   lisinopril (ZESTRIL) 5 MG tablet Take 1 tablet (5 mg total) by mouth daily. 90 tablet 3   omalizumab (XOLAIR) 150 MG/ML prefilled syringe Inject 150 mg into the skin every 28 (twenty-eight) days. 1 mL 2   pantoprazole (PROTONIX) 40 MG tablet TAKE 1 TABLET BY MOUTH TWICE A DAY 180 tablet 2   simvastatin (ZOCOR) 40 MG tablet Take 1 tablet (40 mg total) by mouth daily. 90 tablet 3   thyroid (ARMOUR THYROID) 60 MG tablet Take 1 tablet (60 mg total) by mouth daily before breakfast. Take with  15 mg to equal 75 mg total 90 tablet 3   Fluticasone-Salmeterol (ADVAIR DISKUS) 500-50 MCG/DOSE AEPB INHALE 1 PUFF INTO THE LUNGS EVERY 12 HOURS 60 each 5   montelukast (SINGULAIR) 10 MG tablet Take 1 tablet (10 mg total) by mouth at bedtime. 90 tablet 1   No facility-administered medications prior to visit.    Review of Systems  Constitutional:  Negative for chills, diaphoresis, fever, malaise/fatigue and weight loss.  HENT:  Positive for congestion.   Respiratory:  Negative for cough, hemoptysis, sputum production, shortness of breath and wheezing.   Cardiovascular:  Negative for chest pain, palpitations and leg swelling.  Objective:   Vitals:   10/25/21 0953  BP: 110/70   Pulse: 60  Temp: 97.7 F (36.5 C)  TempSrc: Oral  SpO2: 97%  Weight: 170 lb 6.4 oz (77.3 kg)  Height: 5\' 4"  (1.626 m)   SpO2: 97 % O2 Device: None (Room air)  Physical Exam: General: Well-appearing, no acute distress HENT: [place], AT Eyes: EOMI, no scleral icterus Respiratory: Clear to auscultation bilaterally.  No crackles, wheezing or rales Cardiovascular: RRR, -M/R/G, no JVD Extremities:-Edema,-tenderness Neuro: AAO x4, CNII-XII grossly intact Psych: Normal mood, normal affect  Data Reviewed:  Imaging: CXR 01/27/17 - no acute cardiopulmonary abnormalities. No infiltrate, effusion or edema  PFT: 01/16/16 - Ratio 58 FEV1 43% FVC 58% Interpretation: Severe obstructive defect  Labs: CBC with diff 08/06/18 - WBC 8.5 with Eos 5.3% (400 absolute eos)    Assessment & Plan:   Discussion: 66 year old female with severe persistent asthma with prior steroid dependence well-controlled on LABA/ICS and Xolair since 2017. We discussed de-escalating her ICS strength however due to persistent on her current dosing, will continue current management. Counseled on bronchodilator management and step-up for symptoms and asthma action plan.  Severe persistent asthma - well-controlled CONTINUE Xolair monthly at home. Coordinate with pharmacy team for refill  CONTINUE Advair 500-50 mcg daily. This is your EVERYDAY inhaler. REFILL CONTINUE Albuterol inhaler as needed. This is your rescue inhaler CONTINUE singulair 10 mg daily. REFILL CONTINUE Zyrtec 10 mg daily  Asthma Action Plan Increase Advair to ONE puff twice a day for worsening shortness of breath, wheezing and cough. If you symptoms do not improve in 24-48 hours, please our office for evaluation and/or prednisone taper.  Health Maintenance Immunization History  Administered Date(s) Administered   PFIZER(Purple Top)SARS-COV-2 Vaccination 01/14/2020, 04/14/2020   Tdap 01/21/2011   No orders of the defined types were placed in this  encounter.  Meds ordered this encounter  Medications   montelukast (SINGULAIR) 10 MG tablet    Sig: Take 1 tablet (10 mg total) by mouth at bedtime.    Dispense:  90 tablet    Refill:  1   fluticasone-salmeterol (ADVAIR DISKUS) 500-50 MCG/ACT AEPB    Sig: Inhale 1 puff into the lungs in the morning and at bedtime.    Dispense:  60 each    Refill:  11   Return in about 6 months (around 04/24/2022).  I have spent a total time of 30-minutes on the day of the appointment reviewing prior documentation, coordinating care and discussing medical diagnosis and plan with the patient/family. Past medical history, allergies, medications were reviewed. Pertinent imaging, labs and tests included in this note have been reviewed and interpreted independently by me.  [place], MD [facility] 10/25/2021 10:30 AM  Office Number [phone]

## 2021-10-26 ENCOUNTER — Other Ambulatory Visit (HOSPITAL_COMMUNITY): Payer: Self-pay

## 2021-10-26 MED ORDER — OMALIZUMAB 150 MG/ML ~~LOC~~ SOSY
150.0000 mg | PREFILLED_SYRINGE | SUBCUTANEOUS | 1 refills | Status: DC
  Filled 2021-10-26: qty 3, 84d supply, fill #0
  Filled 2021-10-29: qty 1, 28d supply, fill #0
  Filled 2021-11-08: qty 1, 28d supply, fill #1
  Filled 2021-12-06 – 2022-01-03 (×2): qty 1, 28d supply, fill #2
  Filled 2022-01-22: qty 1, 28d supply, fill #3
  Filled 2022-02-25: qty 1, 28d supply, fill #4
  Filled 2022-03-28: qty 1, 28d supply, fill #5

## 2021-10-26 NOTE — Telephone Encounter (Signed)
Refill sent for [facility] to [place]: [phone]   Dose: 150 mg SQ every 4 weeks  Last OV: 10/25/21 Provider: Dr. Loanne Drilling  Next OV: 6 months  Knox Saliva, PharmD, MPH, BCPS Clinical Pharmacist (Rheumatology and Pulmonology)

## 2021-10-29 ENCOUNTER — Other Ambulatory Visit (HOSPITAL_COMMUNITY): Payer: Self-pay

## 2021-11-05 ENCOUNTER — Other Ambulatory Visit (HOSPITAL_COMMUNITY): Payer: Self-pay

## 2021-11-08 ENCOUNTER — Other Ambulatory Visit (HOSPITAL_COMMUNITY): Payer: Self-pay

## 2021-11-10 IMAGING — CT CT ABD-PELV W/ CM
2 of 5 series · 16 of 46 positions shown, 18 images · IV contrast (Omnipaque)
Comparison: CT abdomen and pelvis dated September 08, 2008

CLINICAL DATA: Left lower quadrant pain

EXAM:
CT ABDOMEN AND PELVIS WITH CONTRAST
TECHNIQUE: Multidetector CT imaging of the abdomen and pelvis was performed
using the standard protocol following bolus administration of
intravenous contrast.
CONTRAST:  100mL OMNIPAQUE IOHEXOL 300 MG/ML  SOLN

[Series 2: axial st · axial · 0.98mm/px · z∈[-475,-70]mm · 13 of 91 slices shown, 15 images]
[im 5/91  soft-tissue]
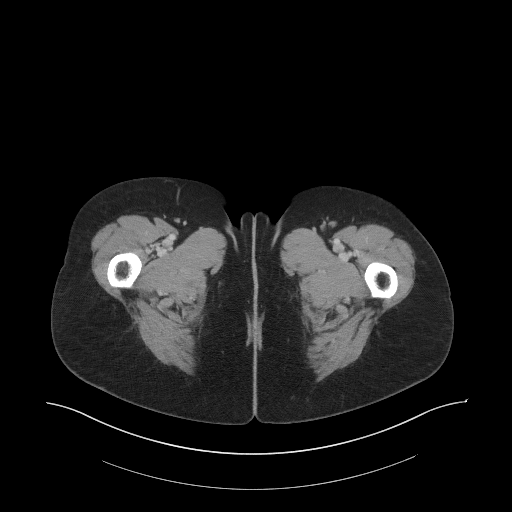
[im 5/91  bone]
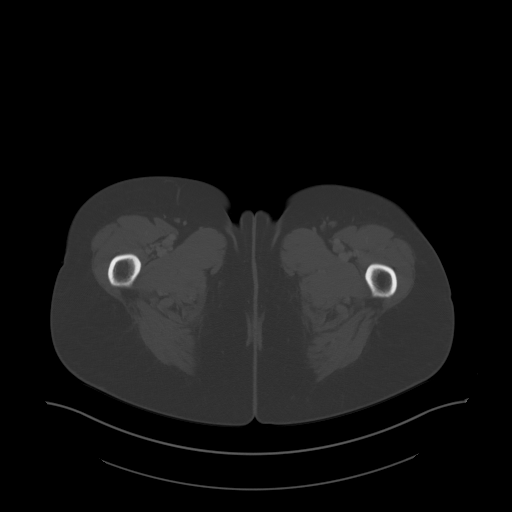
[im 15/91  soft-tissue]
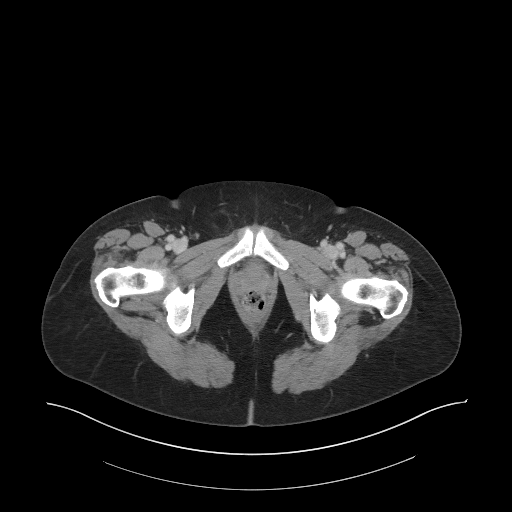
[im 19/91  soft-tissue]
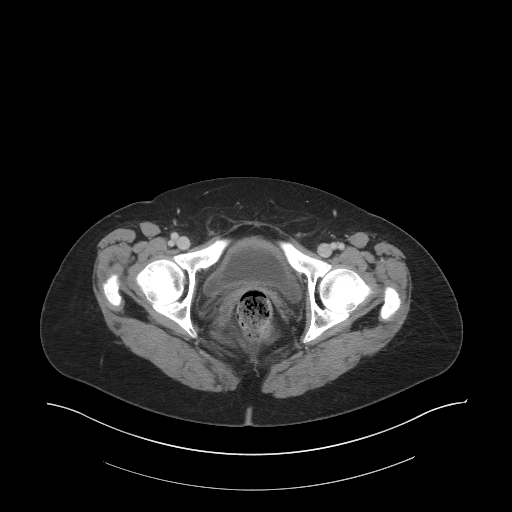
[im 24/91  soft-tissue]
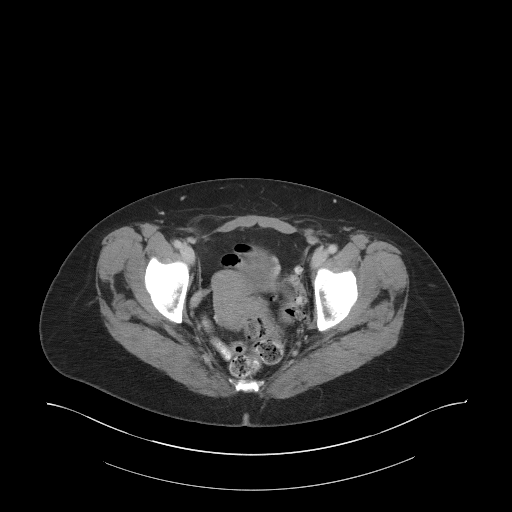
[im 34/91  soft-tissue]
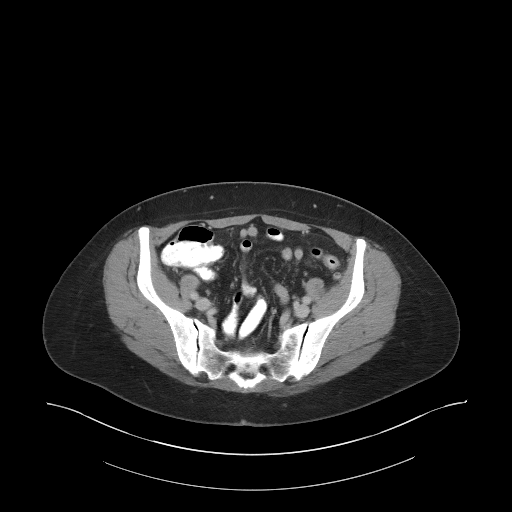
[im 38/91  soft-tissue]
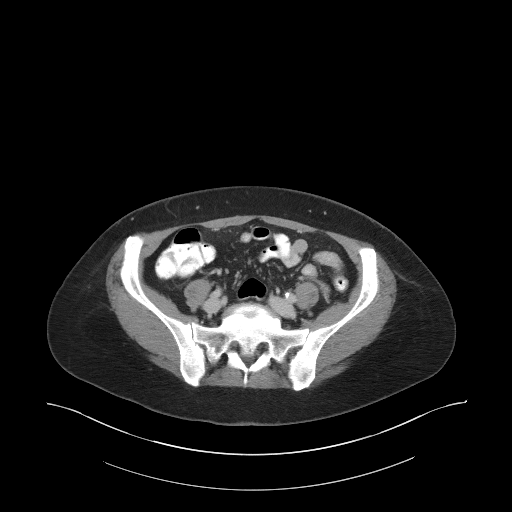
[im 48/91  soft-tissue]
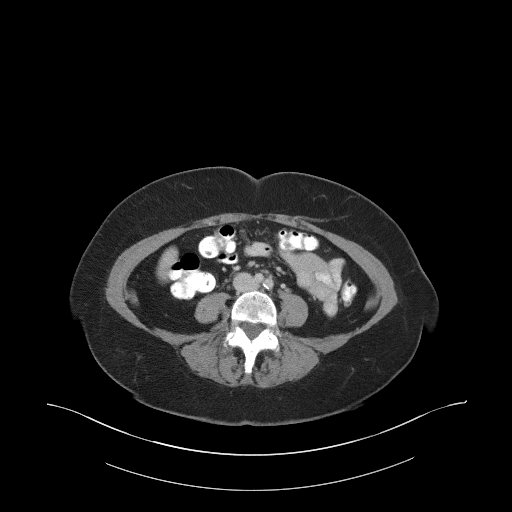
[im 53/91  soft-tissue]
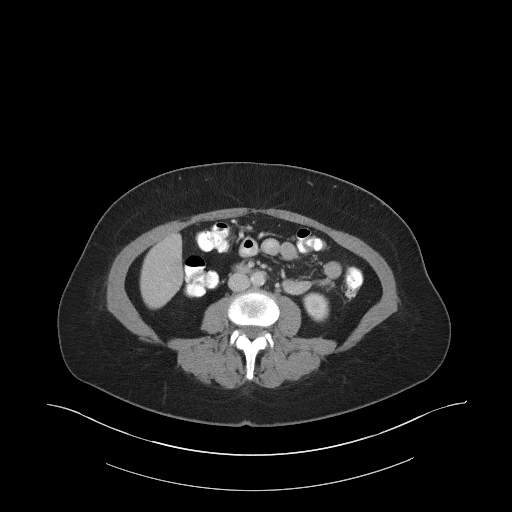
[im 57/91  soft-tissue]
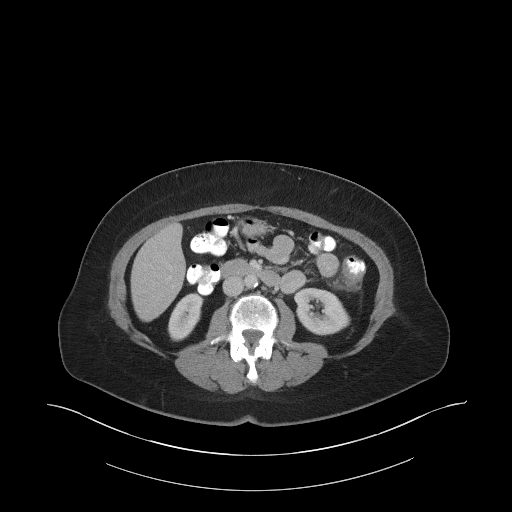
[im 57/91  bone]
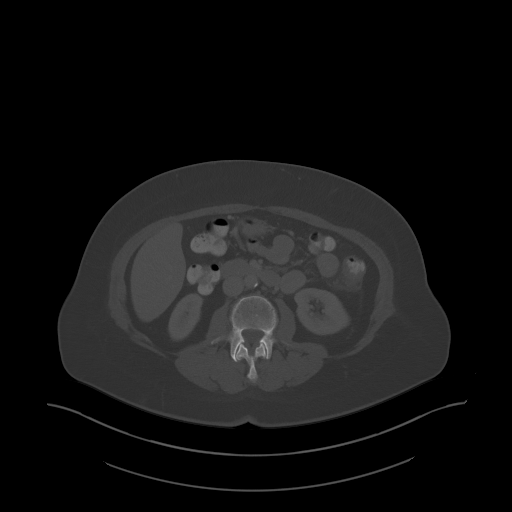
[im 67/91  soft-tissue]
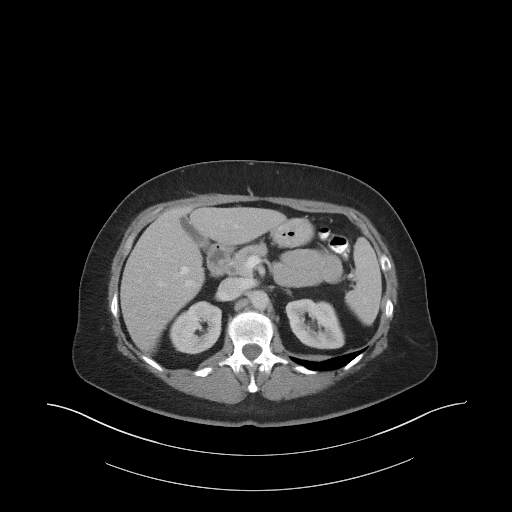
[im 72/91  soft-tissue]
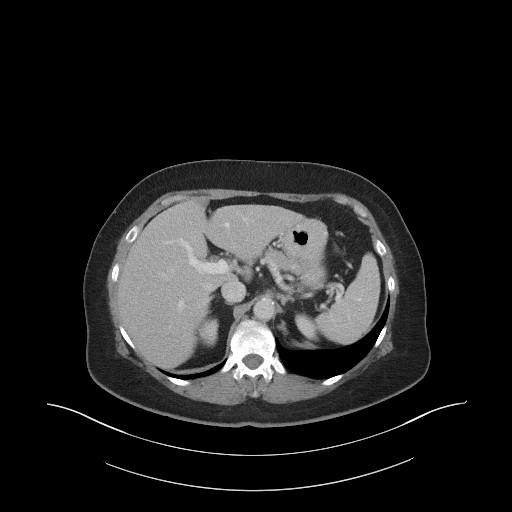
[im 76/91  soft-tissue]
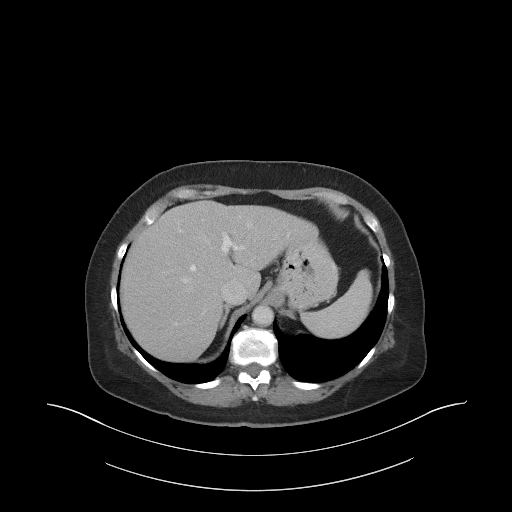
[im 86/91  soft-tissue]
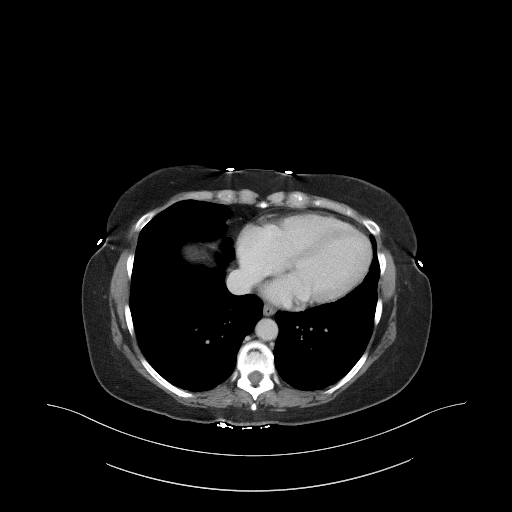

[Series 5: coronal st · coronal · 0.77mm/px · 3 of 88 slices shown]
[im 30/88  soft-tissue]
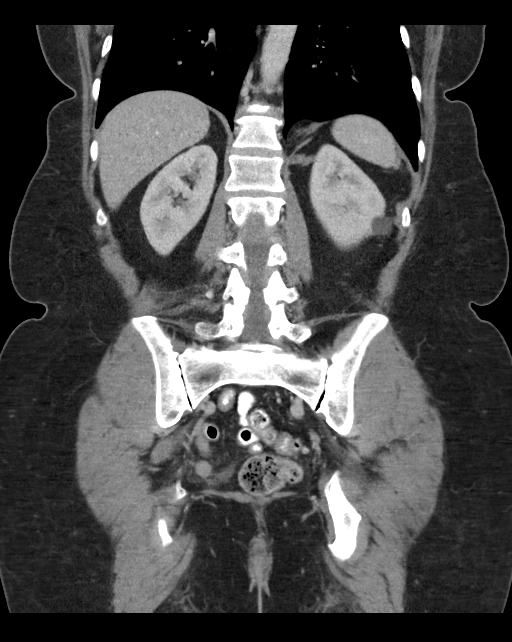
[im 39/88  soft-tissue]
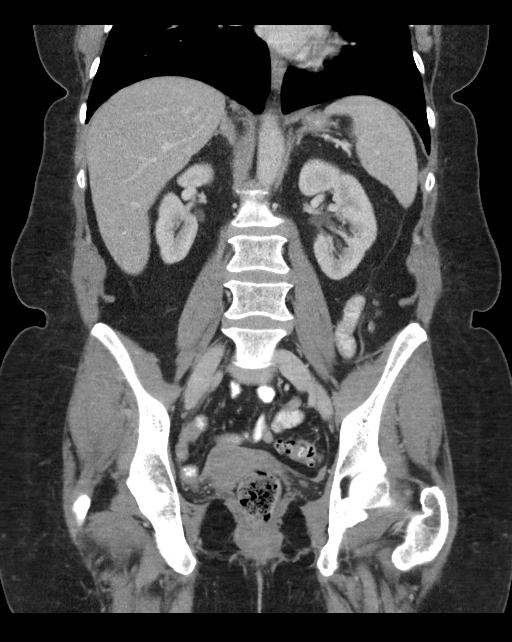
[im 49/88  soft-tissue]
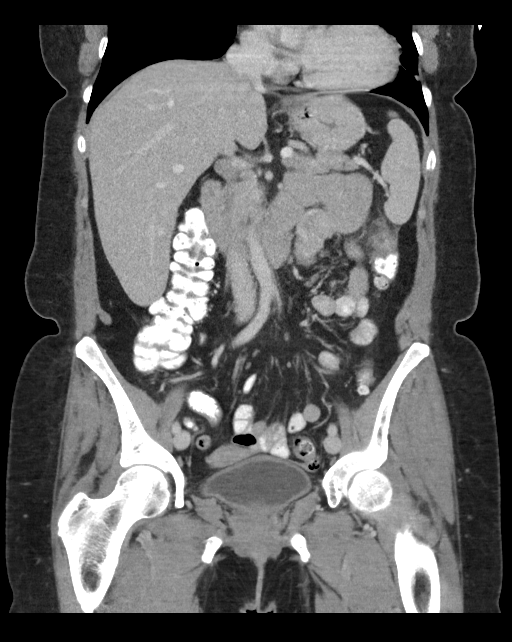

[16 of 46 positions shown; findings below may reference images not displayed]

FINDINGS: Lower chest: No acute abnormality.

Hepatobiliary: No focal liver abnormality is seen. No gallstones,
gallbladder wall thickening, or biliary dilatation.

Pancreas: Unremarkable. No pancreatic ductal dilatation or
surrounding inflammatory changes.

Spleen: Normal in size without focal abnormality.

Adrenals/Urinary Tract: Bilateral adrenal glands are unremarkable.
No hydronephrosis or nephrolithiasis. Simple cyst of the interpolar
region of the left kidney. Bladder is unremarkable for degree of
decompression.

Stomach/Bowel: Wall thickening and fat stranding of the proximal
left colon with associated inflamed diverticula. Normal stomach.
Normal appendix. No evidence of obstruction.

Vascular/Lymphatic: Aortic atherosclerosis. No enlarged abdominal or
pelvic lymph nodes.

Reproductive: Uterus and bilateral adnexa are unremarkable.

Other: Small fat containing bilateral inguinal hernias.

Musculoskeletal: No acute or significant osseous findings.
IMPRESSION: Findings compatible with acute uncomplicated diverticulitis of the
proximal left colon.

Aortic Atherosclerosis ([id number]).

These results will be called to the ordering clinician or
representative by the Radiologist Assistant, and communication
documented in the PACS or [REDACTED].

## 2021-12-03 ENCOUNTER — Other Ambulatory Visit (HOSPITAL_COMMUNITY): Payer: Self-pay

## 2021-12-06 ENCOUNTER — Other Ambulatory Visit (HOSPITAL_COMMUNITY): Payer: Self-pay

## 2021-12-25 ENCOUNTER — Encounter: Payer: Self-pay | Admitting: Family Medicine

## 2021-12-25 ENCOUNTER — Other Ambulatory Visit: Payer: Self-pay | Admitting: Pulmonary Disease

## 2021-12-25 MED ORDER — PANTOPRAZOLE SODIUM 40 MG PO TBEC: 40.0000 mg | DELAYED_RELEASE_TABLET | Freq: Two times a day (BID) | ORAL | 1 refills | Status: DC

## 2021-12-26 ENCOUNTER — Telehealth: Payer: Self-pay

## 2021-12-26 NOTE — Telephone Encounter (Signed)
-----   Message from [place], MD sent at 12/25/2021 12:42 PM EDT ----- ?Regarding: Schedule follow-up ?Please schedule patient for follow-up in July 2023 ? ?

## 2021-12-26 NOTE — Telephone Encounter (Signed)
Called pt no message left, to get appt scheduled with Dr. Loanne Drilling in July for Asthma f/u ?

## 2021-12-27 ENCOUNTER — Other Ambulatory Visit: Payer: Self-pay | Admitting: Pulmonary Disease

## 2022-01-03 ENCOUNTER — Other Ambulatory Visit (HOSPITAL_COMMUNITY): Payer: Self-pay

## 2022-01-18 ENCOUNTER — Other Ambulatory Visit (HOSPITAL_COMMUNITY): Payer: Self-pay

## 2022-01-22 ENCOUNTER — Other Ambulatory Visit (HOSPITAL_COMMUNITY): Payer: Self-pay

## 2022-01-31 ENCOUNTER — Other Ambulatory Visit (HOSPITAL_COMMUNITY): Payer: Self-pay

## 2022-02-25 ENCOUNTER — Other Ambulatory Visit (HOSPITAL_COMMUNITY): Payer: Self-pay

## 2022-03-05 ENCOUNTER — Other Ambulatory Visit (HOSPITAL_COMMUNITY): Payer: Self-pay

## 2022-03-26 ENCOUNTER — Other Ambulatory Visit (HOSPITAL_COMMUNITY): Payer: Self-pay

## 2022-03-28 ENCOUNTER — Other Ambulatory Visit (HOSPITAL_COMMUNITY): Payer: Self-pay

## 2022-04-02 ENCOUNTER — Other Ambulatory Visit (HOSPITAL_COMMUNITY): Payer: Self-pay

## 2022-04-17 ENCOUNTER — Encounter: Payer: Self-pay | Admitting: Family Medicine

## 2022-04-17 ENCOUNTER — Ambulatory Visit: Payer: BC Managed Care – PPO | Admitting: Pulmonary Disease

## 2022-04-17 ENCOUNTER — Encounter: Payer: Self-pay | Admitting: Pulmonary Disease

## 2022-04-17 VITALS — BP 118/70 | HR 70 | Temp 97.9°F | Ht 64.0 in | Wt 166.8 lb

## 2022-04-17 DIAGNOSIS — J4550 Severe persistent asthma, uncomplicated: Secondary | ICD-10-CM | POA: Diagnosis not present

## 2022-04-17 DIAGNOSIS — Z7952 Long term (current) use of systemic steroids: Secondary | ICD-10-CM | POA: Diagnosis not present

## 2022-04-17 MED ORDER — EPINEPHRINE 0.3 MG/0.3ML IJ SOAJ: INTRAMUSCULAR | 2 refills | Status: DC

## 2022-04-17 MED ORDER — MONTELUKAST SODIUM 10 MG PO TABS: 10.0000 mg | ORAL_TABLET | Freq: Every day | ORAL | 3 refills | Status: DC

## 2022-04-17 NOTE — Progress Notes (Signed)
Subjective:   PATIENT ID: Amanda [name] GENDER: female DOB: 03-Feb-1956, MRN: [id number]   HPI  Chief Complaint  Patient presents with   Follow-up    Patient states that she has good days and bad days with her asthma. Sometimes she uses rescue/nebulizer once a week and sometimes several times a day. She states she is short of breath with activity/exertion but states she feels like its not asthma its something else.     Reason for Visit: Follow-up asthma  [name] is a 66 year old never smoker with severe persistent asthma with steroid dependence currently on Xolair, seasonal allergic rhinitis who present for follow-up for asthma  She was previously a patient of Dr. Lenna Gilford, whom she followed for many years. Last seen in October 2019 with stable asthma symptoms during that visit. Symptoms are triggered by exertion, illness, strong odors and allergies. Denies nocturnal symptoms.  She reports she overall well controlled on Xolair and Advair 500-50 mcg once a day. She has had to increased Advair to twice a day when she exerts herself more and during changes of seasons. Denies any exacerbation since our last visit. Last week had a cold that self-resolved after a few days. Still has residual cough that is aggravated by pollen. Has been >2 years since needing steroids.  10/25/21 She reports her symptoms are well-controlled Xolair and Advair 500-50 mcg once a day. During the spring and winter she may need her inhaler twice a day for worsening symptoms. Has had to use albuterol twice a week. She notices worsening symptoms with wheezing and shortness of breath when she is nearing the time to re-dose her biologic and Advair. Has congestion triggered by cold weather. Since our last visit she had a sinus infection in December that was relieved with OTC meds, no antibiotics needed.   04/17/22 She is compliant with Xolair and Advair 500-50 mcg ONCE a day. Her symptoms have increased this  summer and has used her Advair twice a day for five days. She cares for her elderly mother and currently living with her. Reports daily coughing and currently on lisinopril. No wheezing. No limitation in activity, able to ambulate within office. Reflux is better controlled on Protonix and Xantac.   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 Take with 60 mg to equal 75 mg total 90 tablet 3   aspirin 81 MG tablet Take 81 mg by mouth daily.      cetirizine (ZYRTEC) 10 MG tablet Take 10 mg by mouth daily as needed.     Cholecalciferol 50 MCG (2000 UT) CAPS Take 6,000 Units by mouth.     fluticasone-salmeterol (ADVAIR DISKUS) 500-50 MCG/ACT  AEPB Inhale 1 puff into the lungs in the morning and at bedtime. 60 each 11   lisinopril (ZESTRIL) 5 MG tablet Take 1 tablet (5 mg total) by mouth daily. 90 tablet 3   omalizumab (XOLAIR) 150 MG/ML prefilled syringe Inject 150 mg into the skin every 28 (twenty-eight) days. 3 mL 1   pantoprazole (PROTONIX) 40 MG tablet Take 1 tablet (40 mg total) by mouth 2 (two) times daily. 180 tablet 1   simvastatin (ZOCOR) 40 MG tablet Take 1 tablet (40 mg total) by mouth daily. 90 tablet 3   thyroid (ARMOUR THYROID) 60 MG tablet Take 1 tablet (60 mg total) by mouth daily before breakfast. Take with 15 mg to  equal 75 mg total 90 tablet 3   montelukast (SINGULAIR) 10 MG tablet Take 1 tablet (10 mg total) by mouth at bedtime. 90 tablet 1   EPINEPHrine 0.3 mg/0.3 mL IJ SOAJ injection INJECT 1 PEN INTO THE MUSCLE AS NEEDED FOR ANAPHYLAXIS. 2 each 2   No facility-administered medications prior to visit.    Review of Systems  Constitutional:  Negative for chills, diaphoresis, fever, malaise/fatigue and weight loss.  HENT:  Negative for congestion.   Respiratory:  Positive for cough. Negative for hemoptysis, sputum production, shortness of breath and wheezing.   Cardiovascular:  Negative for chest pain, palpitations and leg swelling.   Objective:   Vitals:   04/17/22 0928  BP: 118/70  Pulse: 70  Temp: 97.9 F (36.6 C)  TempSrc: Oral  SpO2: 97%  Weight: 166 lb 12.8 oz (75.7 kg)  Height: '5\' 4"'$  (1.626 m)   SpO2: 97 % O2 Device: None (Room air)  Physical Exam: General: Well-appearing, no acute distress HENT: [place], AT Eyes: EOMI, no scleral icterus Respiratory: Clear to auscultation bilaterally.  No crackles, wheezing or rales Cardiovascular: RRR, -M/R/G, no JVD Extremities:-Edema,-tenderness Neuro: AAO x4, CNII-XII grossly intact Psych: Normal mood, normal affect  Data Reviewed:  Imaging: CXR 01/27/17 - no acute cardiopulmonary abnormalities. No infiltrate, effusion or edema  PFT: 01/16/16 - Ratio 58 FEV1 43% FVC 58% Interpretation: Severe obstructive defect  Labs: CBC with diff 08/06/18 - WBC 8.5 with Eos 5.3% (400 absolute eos) CBC with 07/18/21 - Absolute eos 300    Assessment & Plan:   Discussion: 66 year old female with severe persistent asthma with prior steroid dependence currently well controlled on high dose LABA/ICS/LABA and Xolair since 2017. Hold on de-escalating Advair due to persistent coughing however may be related to ACE-inhibitor. Asthma and reflux management maximized. Will message PCP to consider trial off lisinopril. Discussed clinical course and management of  asthma including bronchodilator regimen and action plan for exacerbation.  Severe persistent asthma - well-controlled CONTINUE Xolair monthly at home. Refilled epi pen  CONTINUE Advair 500-50 mcg daily. This is your EVERYDAY inhaler.  CONTINUE Albuterol inhaler as needed. This is your rescue inhaler CONTINUE singulair 10 mg daily. REFILL CONTINUE Zyrtec 10 mg daily  Asthma Action Plan Increase Advair to ONE puff twice a day for worsening shortness of breath, wheezing and cough. If you symptoms do not improve in 24-48 hours, please our office for evaluation and/or prednisone taper.  Chronic cough Please discuss with your PCP regarding holding lisinopril as this may be contributing to your cough. Will message Dr. Edilia Bo  Health Maintenance Immunization History  Administered Date(s) Administered   PFIZER(Purple Top)SARS-COV-2 Vaccination 01/14/2020, 04/14/2020   Tdap 01/21/2011   No orders of the defined types were placed in this encounter.  Meds ordered this  encounter  Medications   montelukast (SINGULAIR) 10 MG tablet    Sig: Take 1 tablet (10 mg total) by mouth at bedtime.    Dispense:  90 tablet    Refill:  3   EPINEPHrine 0.3 mg/0.3 mL IJ SOAJ injection    Sig: INJECT 1 PEN INTO THE MUSCLE AS NEEDED FOR ANAPHYLAXIS.    Dispense:  2 each    Refill:  2   Return in about 6 months (around 10/18/2022).  I have spent a total time of 30-minutes on the day of the appointment including chart review, data review, collecting history, coordinating care and discussing medical diagnosis and plan with the patient/family. Past medical history, allergies, medications were reviewed. Pertinent imaging, labs and tests included in this note have been reviewed and interpreted independently by me.  [place], MD [facility] 04/17/2022 9:56 AM  Office Number [phone]

## 2022-04-17 NOTE — Patient Instructions (Signed)
Severe persistent asthma - well-controlled CONTINUE Xolair monthly at home. Refilled epi pen  CONTINUE Advair 500-50 mcg daily. This is your EVERYDAY inhaler.  CONTINUE Albuterol inhaler as needed. This is your rescue inhaler CONTINUE singulair 10 mg daily. REFILL CONTINUE Zyrtec 10 mg daily  Asthma Action Plan Increase Advair to ONE puff twice a day for worsening shortness of breath, wheezing and cough. If you symptoms do not improve in 24-48 hours, please our office for evaluation and/or prednisone taper.  Chronic cough Please discuss with your PCP regarding holding lisinopril as this may be contributing to your cough. Will message Dr. Edilia Bo  Follow-up with me in 6 months

## 2022-04-23 ENCOUNTER — Other Ambulatory Visit: Payer: Self-pay | Admitting: Pulmonary Disease

## 2022-04-23 ENCOUNTER — Other Ambulatory Visit (HOSPITAL_COMMUNITY): Payer: Self-pay

## 2022-04-23 DIAGNOSIS — J4550 Severe persistent asthma, uncomplicated: Secondary | ICD-10-CM

## 2022-04-24 ENCOUNTER — Other Ambulatory Visit (HOSPITAL_COMMUNITY): Payer: Self-pay

## 2022-04-24 MED ORDER — OMALIZUMAB 150 MG/ML ~~LOC~~ SOSY
150.0000 mg | PREFILLED_SYRINGE | SUBCUTANEOUS | 1 refills | Status: DC
  Filled 2022-04-24: qty 1, 28d supply, fill #0
  Filled 2022-05-23: qty 1, 28d supply, fill #1
  Filled 2022-06-20: qty 1, 28d supply, fill #2
  Filled 2022-07-17: qty 1, 28d supply, fill #3
  Filled 2022-08-23: qty 1, 28d supply, fill #4
  Filled 2022-09-24: qty 1, 28d supply, fill #5

## 2022-04-24 NOTE — Telephone Encounter (Signed)
Refill sent for [facility] to [place]: [phone]   Dose: 150 mg every 4 weeks  Last OV: 04/17/22 Provider: Dr. Loanne Drilling  Next OV: 6 months  Knox Saliva, PharmD, MPH, BCPS Clinical Pharmacist (Rheumatology and Pulmonology)

## 2022-04-25 ENCOUNTER — Other Ambulatory Visit (HOSPITAL_COMMUNITY): Payer: Self-pay

## 2022-05-15 ENCOUNTER — Encounter: Payer: Self-pay | Admitting: Pulmonary Disease

## 2022-05-15 NOTE — Telephone Encounter (Signed)
Mychart message sent by pt:  [facility] (supporting Chi Rodman Pickle, MD) 1 hour ago (12:47 PM)    Dr. Loanne Drilling, i have been battling a sinus issue for about 2-3 weeks now. i have been wheezing, out of breath more than normal. rescue inhaler use is 3-4x a day for the last few days. i think i need a steroid taper or something to get me over the hump. can that be arranged? i am headed out of town on saturday for 5 days. can a prescription be called in before saturday? [facility] or [facility] in [place]...either one is fine. thanks, Ramia Sidney, please advise if you are okay prescribing a pred taper for pt.

## 2022-05-15 NOTE — Telephone Encounter (Signed)
Routing to Dr. 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 Did send pt a Pharmacist, community message stating if she got worse between now and then to either go to [facility] or ED or that she could also contact PCP.

## 2022-05-16 ENCOUNTER — Telehealth: Payer: Self-pay | Admitting: Pulmonary Disease

## 2022-05-16 MED ORDER — PREDNISONE 10 MG PO TABS: ORAL_TABLET | ORAL | 0 refills | Status: AC

## 2022-05-16 NOTE — Telephone Encounter (Signed)
I do not see where anybody from our office tried to call pt. Attempted to call pt to further discuss this but unable to reach and unable to leave VM.  If pt calls back, see if she has a name of whom possibly called her and if they left her a message for the reason for call. If she has no message, then it might have been a mistake.

## 2022-05-23 ENCOUNTER — Other Ambulatory Visit (HOSPITAL_COMMUNITY): Payer: Self-pay

## 2022-05-27 ENCOUNTER — Other Ambulatory Visit (HOSPITAL_COMMUNITY): Payer: Self-pay

## 2022-06-18 ENCOUNTER — Other Ambulatory Visit (HOSPITAL_COMMUNITY): Payer: Self-pay

## 2022-06-20 ENCOUNTER — Other Ambulatory Visit: Payer: Self-pay | Admitting: Family Medicine

## 2022-06-20 ENCOUNTER — Other Ambulatory Visit (HOSPITAL_COMMUNITY): Payer: Self-pay

## 2022-06-20 ENCOUNTER — Other Ambulatory Visit: Payer: Self-pay | Admitting: Pulmonary Disease

## 2022-06-20 DIAGNOSIS — I10 Essential (primary) hypertension: Secondary | ICD-10-CM

## 2022-06-27 ENCOUNTER — Other Ambulatory Visit (HOSPITAL_COMMUNITY): Payer: Self-pay

## 2022-07-01 ENCOUNTER — Other Ambulatory Visit (HOSPITAL_COMMUNITY): Payer: Self-pay

## 2022-07-08 ENCOUNTER — Other Ambulatory Visit: Payer: Self-pay | Admitting: Family Medicine

## 2022-07-08 ENCOUNTER — Encounter: Payer: Self-pay | Admitting: Family Medicine

## 2022-07-08 DIAGNOSIS — I10 Essential (primary) hypertension: Secondary | ICD-10-CM

## 2022-07-08 MED ORDER — PANTOPRAZOLE SODIUM 40 MG PO TBEC: 40.0000 mg | DELAYED_RELEASE_TABLET | Freq: Two times a day (BID) | ORAL | 1 refills | Status: DC

## 2022-07-09 MED ORDER — LISINOPRIL 5 MG PO TABS: 5.0000 mg | ORAL_TABLET | Freq: Every day | ORAL | 0 refills | Status: DC

## 2022-07-11 ENCOUNTER — Encounter: Payer: Self-pay | Admitting: Pulmonary Disease

## 2022-07-11 ENCOUNTER — Other Ambulatory Visit (HOSPITAL_COMMUNITY): Payer: Self-pay

## 2022-07-11 ENCOUNTER — Telehealth: Payer: Self-pay | Admitting: Pharmacist

## 2022-07-11 NOTE — Telephone Encounter (Signed)
Patient sent MyChart message with new Xolair copay card information that is active as of 07/08/22: ID: [id number] BIN: [id number] Group: [id number] PCN: [id number]  New Xolair billing information has already been entered into [facility]  Knox Saliva, PharmD, MPH, BCPS, CPP Clinical Pharmacist (Rheumatology and Pulmonology)

## 2022-07-17 ENCOUNTER — Other Ambulatory Visit (HOSPITAL_COMMUNITY): Payer: Self-pay

## 2022-07-26 ENCOUNTER — Telehealth: Payer: Self-pay

## 2022-07-26 NOTE — Telephone Encounter (Signed)
PA initiated via Covermymeds; KEY: [id number]. Awaiting determination.

## 2022-07-28 ENCOUNTER — Encounter: Payer: Self-pay | Admitting: Family Medicine

## 2022-07-29 ENCOUNTER — Ambulatory Visit: Payer: BC Managed Care – PPO | Admitting: Family Medicine

## 2022-07-29 ENCOUNTER — Telehealth: Payer: Self-pay | Admitting: Family Medicine

## 2022-07-29 VITALS — BP 118/72 | HR 73 | Temp 98.0°F | Resp 18 | Ht 65.0 in | Wt 165.0 lb

## 2022-07-29 DIAGNOSIS — J029 Acute pharyngitis, unspecified: Secondary | ICD-10-CM

## 2022-07-29 LAB — POCT RAPID STREP A (OFFICE): Rapid Strep A Screen: POSITIVE — AB

## 2022-07-29 MED ORDER — PENICILLIN V POTASSIUM 500 MG PO TABS: 500.0000 mg | ORAL_TABLET | Freq: Two times a day (BID) | ORAL | 0 refills | Status: DC

## 2022-07-29 NOTE — Patient Instructions (Signed)
It was good to see you today- you do have strep throat We will treat you with penicillin  Let me know if you are not getting better in a day or so

## 2022-07-29 NOTE — Telephone Encounter (Signed)
Patient called to advise that she has "a horrific sore throat on one" side but no other symptoms. Patient was offered an office visit with another provider but patient declined. She is aware that the nurse may advise an office visit is needed but wants to wait to speak to the nurse to see what they say.

## 2022-07-29 NOTE — Telephone Encounter (Signed)
Tried calling the pt to offer the 10 am appt slot. Got no answer and VM box needed som sort of password- so I was unable to leave a message.

## 2022-07-29 NOTE — Telephone Encounter (Signed)
Approved on October 21 Effective from 07/26/2022 through 07/25/2023.

## 2022-07-29 NOTE — Telephone Encounter (Signed)
Pt advised that the PA has been approved while in office today 07/29/22.

## 2022-07-29 NOTE — Progress Notes (Signed)
[facility] [address], [place], [place] [address] [phone]  Date:  07/29/2022   Name:  Amanda [name]   DOB:  05-Jul-1956   MRN:  [id number]  PCP:  Darreld Mclean, MD    Chief Complaint: Sore Throat (Just on the L side x yesterday. No other sxs. )   History of Present Illness:  Amanda [name] is a 66 y.o. very pleasant female patient who presents with the following:  History of asthma, venous insufficiency, allergies, allergies, hypothyroidism, hyperlipidemia.  Seen today with concern of sore throat  Last seen by myself 8/22  She notes a left sided ST since yesterday She otherwise feels well She tired some throat drops but did not really help  No sick contacts that she is aware of  She declines covid testing today     Patient Active Problem List   Diagnosis Date Noted   Left lower quadrant abdominal pain 07/18/2021   Urinary frequency 07/18/2021   Severe persistent asthma 12/05/2020   Severe persistent asthma dependent on systemic steroids without complication 76/73/4193   Chronic venous insufficiency 07/25/2016   Pain of left leg 03/19/2016   LPRD (laryngopharyngeal reflux disease) 01/16/2016   Vitamin D deficiency 06/22/2009   Backache 09/05/2008   Overweight 04/13/2008   Anxiety state 04/13/2008   Allergic rhinitis 04/13/2008   Diaphragmatic hernia 04/13/2008   Osteoarthritis 04/13/2008   Hypothyroidism 04/12/2008   Hyperlipidemia 04/12/2008    Past Medical History:  Diagnosis Date   Allergic rhinitis    Asthma    Diverticulitis    Gastroesophageal reflux disease with hiatal hernia    History of shingles    Hyperlipidemia    Hypothyroidism    Osteoarthritis    Overweight(278.02)    Vitamin D deficiency     Past Surgical History:  Procedure Laterality Date   breast reductions  2000   [place]   COLONOSCOPY     left knee arthroscopy  1999   by Dr. Eddie Dibbles    REDUCTION MAMMAPLASTY      Social History   Tobacco Use   Smoking status: Never   Smokeless tobacco: Never  Vaping Use   Vaping Use: Never used  Substance Use Topics   Alcohol use: Yes    Comment: social use   Drug use: No    Family History  Problem Relation Age of Onset   Lung cancer Sister    Heart disease Maternal Grandfather    Colon cancer Neg Hx    Stomach cancer Neg Hx    Rectal cancer Neg Hx     Allergies  Allergen Reactions   Azithromycin     zpak does not work for the pt    Medication list has been reviewed and updated.  Current Outpatient Medications on File Prior to Visit  Medication Sig Dispense Refill   ARMOUR THYROID 15 MG tablet Take 1 tablet (15 mg total) by mouth daily. Take with 60 mg to equal 75 mg total 90 tablet 3   cetirizine (ZYRTEC) 10 MG tablet Take 10 mg by mouth daily as needed.     Cholecalciferol 50 MCG (2000 UT) CAPS Take 6,000 Units by mouth.     EPINEPHrine 0.3 mg/0.3 mL IJ SOAJ injection INJECT 1 PEN INTO THE MUSCLE AS NEEDED FOR ANAPHYLAXIS. 2 each 2   fluticasone-salmeterol (ADVAIR DISKUS) 500-50 MCG/ACT AEPB Inhale 1 puff into the lungs in the morning and  at bedtime. 60 each 11   lisinopril (ZESTRIL) 5 MG tablet Take 1 tablet (5 mg total) by mouth daily. 30 tablet 0   montelukast (SINGULAIR) 10 MG tablet TAKE 1 TABLET BY MOUTH EVERYDAY AT BEDTIME 90 tablet 1   omalizumab (XOLAIR) 150 MG/ML prefilled syringe Inject 150 mg into the skin every 28 (twenty-eight) days. 3 mL 1   pantoprazole (PROTONIX) 40 MG tablet Take 1 tablet (40 mg total) by mouth 2 (two) times daily. 180 tablet 1   simvastatin (ZOCOR) 40 MG tablet Take 1 tablet (40 mg total) by mouth daily. 90 tablet 3   thyroid (ARMOUR THYROID) 60 MG tablet Take 1 tablet (60 mg total) by mouth daily before breakfast. Take with 15 mg to equal 75 mg total 90 tablet 3   No current facility-administered medications on file prior to visit.    Review of Systems:  As per HPI- otherwise  negative.   Physical Examination: Vitals:   07/29/22 1008  BP: 118/72  Pulse: 73  Resp: 18  Temp: 98 F (36.7 C)  SpO2: 99%   Vitals:   07/29/22 1008  Weight: 165 lb (74.8 kg)  Height: '5\' 5"'$  (1.651 m)   Body mass index is 27.46 kg/m. Ideal Body Weight: Weight in (lb) to have BMI = 25: 149.9  GEN: no acute distress. Mild overweight, looks well  HEENT: Atraumatic, Normocephalic.  Bilateral TM wnl, oropharynx appears normal.  PEERL,EOMI.   Ears and Nose: No external deformity. CV: RRR, No M/G/R. No JVD. No thrill. No extra heart sounds. PULM: CTA B, no wheezes, crackles, rhonchi. No retractions. No resp. distress. No accessory muscle use. EXTR: No c/c/e PSYCH: Normally interactive. Conversant.   Results for orders placed or performed in visit on 07/29/22  POCT rapid strep A  Result Value Ref Range   Rapid Strep A Screen Positive (A) Negative    Assessment and Plan: Sore throat - Plan: POCT rapid strep A, penicillin v potassium (VEETID) 500 MG tablet Seen today with strep pharyngitis Treat with penicillin for 10 days Asked her to alert me if not improving in 1-2 days- Sooner if worse.     Signed Lamar Blinks, MD

## 2022-07-30 ENCOUNTER — Other Ambulatory Visit (HOSPITAL_COMMUNITY): Payer: Self-pay

## 2022-07-31 ENCOUNTER — Encounter: Payer: Self-pay | Admitting: Family Medicine

## 2022-07-31 DIAGNOSIS — J029 Acute pharyngitis, unspecified: Secondary | ICD-10-CM

## 2022-07-31 MED ORDER — PREDNISONE 20 MG PO TABS: ORAL_TABLET | ORAL | 0 refills | Status: DC

## 2022-07-31 MED ORDER — ACETAMINOPHEN-CODEINE 300-30 MG PO TABS: 1.0000 | ORAL_TABLET | Freq: Four times a day (QID) | ORAL | 0 refills | Status: DC | PRN

## 2022-07-31 NOTE — Addendum Note (Signed)
Addended by: Lamar Blinks C on: 07/31/2022 02:31 PM   Modules accepted: Orders

## 2022-08-07 ENCOUNTER — Encounter: Payer: Self-pay | Admitting: *Deleted

## 2022-08-07 ENCOUNTER — Telehealth: Payer: Self-pay | Admitting: *Deleted

## 2022-08-07 NOTE — Patient Instructions (Signed)
Visit Information  Thank you for taking time to visit with me today. Please don't hesitate to contact me if I can be of assistance to you.   Following are the goals we discussed today:   Goals Addressed             This Visit's Progress    COMPLETED: Care Coordination Activities: No follow up required   On track    Care Coordination Interventions: Evaluation of current treatment plan related to general health maintenance and patient's adherence to plan as established by provider Advised patient to provide appropriate vaccination information to provider or CM team member at next visit Reviewed scheduled/upcoming provider appointments including none Screening for signs and symptoms of depression related to chronic disease state  Assessed social determinant of health barriers Confirmed patient does not wish to obtain flu vaccine for 2023-24 flu/ winter season- personal preference; reviewed recent visit with PCP and confirmed patient's throat is no longer sore, took prednisone as prescribed; confirmed patient self-manages medications and denies questions/ concerns around medications; encouraged patient; encouraged patient to consider scheduling annual physical exam with PCP          If you are experiencing a Mental Health or [place] or need someone to talk to, please  call the Suicide and Crisis Lifeline: 988 call the [place] National Suicide Prevention Lifeline: 253-845-9457 or TTY: (678)808-8326 TTY 321-876-5398) to talk to a trained counselor call 1-800-273-TALK (toll free, 24 hour hotline) go to [facility] [address], [place] [phone]) call the [place]: [phone] call 911   Patient verbalizes understanding of instructions and care plan provided today and agrees to view in [place]. Active MyChart status and patient understanding of how to access instructions and care plan via MyChart confirmed  with patient.     No further follow up required: no further or ongoing care coordination needs identified today  Oneta Rack, RN, BSN, CCRN Alumnus RN CM Care Coordination/ Transition of [place] Management [phone]: direct office

## 2022-08-07 NOTE — Patient Outreach (Signed)
  Care Coordination   Initial Visit Note   08/07/2022 Name: Amanda [name] MRN: [id number] DOB: [birth year]  Amanda [name] is a 66 y.o. year old female who sees Copland, Gay Filler, MD for primary care. I spoke with  Amanda [name] by phone today.  What matters to the patients health and wellness today?  "I am doing fine-- I took the prednisone and it helped-- did the trick, I am much better and not having any problems.  I do not get flu vaccines, I think they are stupid"  Interventions provided; no further or ongoing care coordination needs identified today    Goals Addressed             This Visit's Progress    COMPLETED: Care Coordination Activities: No follow up required   On track    Care Coordination Interventions: Evaluation of current treatment plan related to general health maintenance and patient's adherence to plan as established by provider Advised patient to provide appropriate vaccination information to provider or CM team member at next visit Reviewed scheduled/upcoming provider appointments including none Screening for signs and symptoms of depression related to chronic disease state  Assessed social determinant of health barriers Confirmed patient does not wish to obtain flu vaccine for 2023-24 flu/ winter season- personal preference; reviewed recent visit with PCP and confirmed patient's throat is no longer sore, took prednisone as prescribed; confirmed patient self-manages medications and denies questions/ concerns around medications; encouraged patient; encouraged patient to consider scheduling annual physical exam with PCP          SDOH assessments and interventions completed:  Yes  SDOH Interventions Today    Flowsheet Row Most Recent Value  SDOH Interventions   Food Insecurity Interventions Intervention Not Indicated  Transportation Interventions --  [drives self]       Care Coordination Interventions Activated:  Yes  Care  Coordination Interventions:  Yes, provided   Follow up plan: No further intervention required.   Encounter Outcome:  Pt. Visit Completed   Amanda Rack, RN, BSN, CCRN Alumnus RN CM Care Coordination/ Transition of [place] Management [phone]: direct office

## 2022-08-16 ENCOUNTER — Other Ambulatory Visit: Payer: Self-pay | Admitting: Family Medicine

## 2022-08-16 DIAGNOSIS — I10 Essential (primary) hypertension: Secondary | ICD-10-CM

## 2022-08-21 ENCOUNTER — Other Ambulatory Visit (HOSPITAL_COMMUNITY): Payer: Self-pay

## 2022-08-23 ENCOUNTER — Other Ambulatory Visit (HOSPITAL_COMMUNITY): Payer: Self-pay

## 2022-09-03 ENCOUNTER — Other Ambulatory Visit (HOSPITAL_COMMUNITY): Payer: Self-pay

## 2022-09-04 ENCOUNTER — Other Ambulatory Visit (HOSPITAL_COMMUNITY): Payer: Self-pay

## 2022-09-05 ENCOUNTER — Telehealth: Payer: Self-pay

## 2022-09-05 ENCOUNTER — Other Ambulatory Visit (HOSPITAL_COMMUNITY): Payer: Self-pay

## 2022-09-05 NOTE — Telephone Encounter (Signed)
Per [facility], pt's current PA has expired.  Submitted an URGENT Prior Authorization request to [employer] for [employer] via CoverMyMeds. Will update once we receive a response.  Key: [id number]

## 2022-09-06 NOTE — Telephone Encounter (Signed)
Received faxed PA renewal form from [facility] for XOLAIR. Completed PA questions and submitted request via fax along with supporting chart notes. Will await response.  Phone # [phone] Fax # [phone]

## 2022-09-07 ENCOUNTER — Other Ambulatory Visit (HOSPITAL_COMMUNITY): Payer: Self-pay

## 2022-09-09 ENCOUNTER — Other Ambulatory Visit (HOSPITAL_COMMUNITY): Payer: Self-pay

## 2022-09-09 NOTE — Telephone Encounter (Signed)
Received notification from [facility] regarding a prior authorization for XOLAIR. Authorization has been APPROVED from 09/06/2022 to 09/04/2023. Approval letter sent to scan center. Updated [employer] with current Prior Authorization information.  Authorization # [id number]

## 2022-09-24 ENCOUNTER — Other Ambulatory Visit (HOSPITAL_COMMUNITY): Payer: Self-pay

## 2022-09-24 ENCOUNTER — Other Ambulatory Visit: Payer: Self-pay

## 2022-10-02 ENCOUNTER — Other Ambulatory Visit (HOSPITAL_COMMUNITY): Payer: Self-pay

## 2022-10-05 ENCOUNTER — Other Ambulatory Visit: Payer: Self-pay | Admitting: Family Medicine

## 2022-10-05 DIAGNOSIS — I10 Essential (primary) hypertension: Secondary | ICD-10-CM

## 2022-10-08 MED ORDER — LISINOPRIL 5 MG PO TABS: 5.0000 mg | ORAL_TABLET | Freq: Every day | ORAL | 0 refills | Status: DC

## 2022-10-11 ENCOUNTER — Telehealth (HOSPITAL_BASED_OUTPATIENT_CLINIC_OR_DEPARTMENT_OTHER): Payer: Self-pay

## 2022-10-11 ENCOUNTER — Encounter (HOSPITAL_BASED_OUTPATIENT_CLINIC_OR_DEPARTMENT_OTHER): Payer: Self-pay | Admitting: Pulmonary Disease

## 2022-10-11 ENCOUNTER — Ambulatory Visit (INDEPENDENT_AMBULATORY_CARE_PROVIDER_SITE_OTHER): Payer: BC Managed Care – PPO | Admitting: Pulmonary Disease

## 2022-10-11 VITALS — BP 140/78 | HR 56 | Ht 65.0 in | Wt 165.4 lb

## 2022-10-11 DIAGNOSIS — Z7952 Long term (current) use of systemic steroids: Secondary | ICD-10-CM

## 2022-10-11 DIAGNOSIS — J4550 Severe persistent asthma, uncomplicated: Secondary | ICD-10-CM | POA: Diagnosis not present

## 2022-10-11 MED ORDER — ALBUTEROL SULFATE HFA 108 (90 BASE) MCG/ACT IN AERS: 2.0000 | INHALATION_SPRAY | Freq: Four times a day (QID) | RESPIRATORY_TRACT | 2 refills | Status: DC | PRN

## 2022-10-11 MED ORDER — FLUTICASONE-SALMETEROL 500-50 MCG/ACT IN AEPB: 1.0000 | INHALATION_SPRAY | Freq: Two times a day (BID) | RESPIRATORY_TRACT | 11 refills | Status: DC

## 2022-10-11 MED ORDER — MONTELUKAST SODIUM 10 MG PO TABS: 10.0000 mg | ORAL_TABLET | Freq: Every day | ORAL | 3 refills | Status: DC

## 2022-10-11 NOTE — Patient Instructions (Addendum)
Severe persistent asthma - well-controlled CONTINUE Xolair monthly at home.  CONTINUE Advair 500-50 mcg daily. This is your EVERYDAY inhaler.  CONTINUE Albuterol inhaler as needed. This is your rescue inhaler CONTINUE singulair 10 mg daily. REFILL CONTINUE Zyrtec 10 mg daily  Asthma Action Plan Increase Advair to ONE puff twice a day for worsening shortness of breath, wheezing and cough. If you symptoms do not improve in 24-48 hours, please our office for evaluation and/or prednisone taper.  Follow-up me in 6 months

## 2022-10-11 NOTE — Telephone Encounter (Signed)
Pt seen in office wanted to let provider and pharmacy know her Arvid Right was defective. Pictures are in media for review

## 2022-10-11 NOTE — Progress Notes (Signed)
Subjective:   PATIENT ID: Amanda [name] GENDER: female DOB: [birth year], MRN: [id number]   HPI  Chief Complaint  Patient presents with   Follow-up    Feeeling pretty good    Reason for Visit: Follow-up asthma  [name] is a 68 year old never smoker with severe persistent asthma with steroid dependence currently on Xolair, seasonal allergic rhinitis who present for follow-up for asthma  Synopsis She was previously a patient of Amanda [name], whom she followed for many years. Last seen in October 2019 with stable asthma symptoms during that visit. Symptoms are triggered by exertion, illness, strong odors and allergies. Denies nocturnal symptoms.  She reports she overall well controlled on Xolair and Advair 500-50 mcg once a day. She has had to increased Advair to twice a day when she exerts herself more and during changes of seasons. Has been >2 years since needing steroids.  2023 - Well-controlled Xolair and Advair 500-50 mcg once a day. During the spring and winter she may need her inhaler twice a day for worsening symptoms. Usually uses albuterol twice a week. Notices worsening symptoms with wheezing and shortness of breath when she is nearing the time to re-dose her biologic and Advair.   10/11/22 She is compliant with her Advair 500-50 mcg daily. Compliant with Xolair. Reports recent issue with spring loaded injection so had to manually injection. She uses albuterol once a week. Changes in weather were her worst times and will have cough and wheeze. Not in exacerbation. She had strep throat in Oct/Nov and treated with penicillin and steroids. 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 Take with 60 mg to equal 75 mg total 90 tablet 3   cetirizine (ZYRTEC) 10 MG tablet Take 10 mg by mouth daily as needed.     Cholecalciferol 50 MCG (2000 UT) CAPS Take 6,000 Units by mouth.     EPINEPHrine 0.3 mg/0.3 mL IJ SOAJ injection INJECT 1 PEN INTO THE MUSCLE AS NEEDED FOR ANAPHYLAXIS. 2 each 2   lisinopril (ZESTRIL) 5 MG tablet Take 1 tablet (5 mg total) by mouth daily. 90 tablet 0   omalizumab (XOLAIR) 150 MG/ML prefilled syringe Inject 150 mg into the skin every 28 (twenty-eight) days. 3 mL 1   pantoprazole (PROTONIX) 40 MG tablet Take 1 tablet (40 mg total) by mouth 2 (two) times daily. 180 tablet 1   simvastatin (ZOCOR) 40 MG tablet Take 1 tablet (40 mg total) by mouth daily. 90 tablet 3   thyroid (ARMOUR THYROID) 60 MG tablet Take 1 tablet (60 mg total) by mouth daily before  breakfast. Take with 15 mg to equal 75 mg total 90 tablet 3   fluticasone-salmeterol (ADVAIR DISKUS) 500-50 MCG/ACT AEPB Inhale 1 puff into the lungs in the morning and at bedtime. 60 each 11   montelukast (SINGULAIR) 10 MG tablet TAKE 1 TABLET BY MOUTH EVERYDAY AT BEDTIME 90 tablet 1   acetaminophen-codeine (TYLENOL #3) 300-30 MG tablet Take 1-2 tablets by mouth every 6 (six) hours as needed for moderate pain. 15 tablet 0   penicillin v potassium (VEETID) 500 MG tablet Take 1 tablet (500 mg total) by mouth in the morning and at bedtime. 20 tablet 0   predniSONE (DELTASONE) 20 MG tablet Take 40 mg daily for 3 days 6 tablet 0   No facility-administered  medications prior to visit.    Review of Systems  Constitutional:  Negative for chills, diaphoresis, fever, malaise/fatigue and weight loss.  HENT:  Negative for congestion.   Respiratory:  Positive for cough and wheezing. Negative for hemoptysis, sputum production and shortness of breath.   Cardiovascular:  Negative for chest pain, palpitations and leg swelling.   Objective:   Vitals:   10/11/22 0855  BP: (!) 140/78  Pulse: (!) 56  SpO2: 100%  Weight: 165 lb 6.4 oz (75 kg)  Height: '5\' 5"'$  (1.651 m)   SpO2: 100 % O2 Device: None (Room air)  Physical Exam: General: Well-appearing, no acute distress HENT: [place], AT Eyes: EOMI, no scleral icterus Respiratory: Clear to auscultation bilaterally.  No crackles, wheezing or rales Cardiovascular: RRR, -M/R/G, no JVD Extremities:-Edema,-tenderness Neuro: AAO x4, CNII-XII grossly intact Psych: Normal mood, normal affect  Data Reviewed:  Imaging: CXR 01/27/17 - no acute cardiopulmonary abnormalities. No infiltrate, effusion or edema  PFT: 01/16/16 - Ratio 58 FEV1 43% FVC 58% Interpretation: Severe obstructive defect  Labs: CBC with diff 08/06/18 - WBC 8.5 with Eos 5.3% (400 absolute eos) CBC with 07/18/21 - Absolute eos 300    Assessment & Plan:   Discussion: 67 year old female with severe persistent asthma with prior steroid dependence. Well controlled on current regiment with Xolair since 2017. Some mild breakthrough symptoms when nearing time for re-injection. No exacerbations >3 years  Severe persistent asthma - well-controlled CONTINUE Xolair monthly at home.  CONTINUE Advair 500-50 mcg daily. This is your EVERYDAY inhaler.  CONTINUE Albuterol inhaler as needed. This is your rescue inhaler CONTINUE singulair 10 mg daily. REFILL CONTINUE Zyrtec 10 mg daily  Asthma Action Plan Increase Advair to ONE puff twice a day for worsening shortness of breath, wheezing and cough. If you symptoms do not improve in 24-48 hours, please  our office for evaluation and/or prednisone taper.  Health Maintenance Immunization History  Administered Date(s) Administered   PFIZER(Purple Top)SARS-COV-2 Vaccination 01/14/2020, 04/14/2020   Tdap 01/21/2011   No orders of the defined types were placed in this encounter.  Meds ordered this encounter  Medications   fluticasone-salmeterol (ADVAIR DISKUS) 500-50 MCG/ACT AEPB    Sig: Inhale 1 puff into the lungs in the morning and at bedtime.    Dispense:  60 each    Refill:  11   montelukast (SINGULAIR) 10 MG tablet    Sig: Take 1 tablet (10 mg total) by mouth at bedtime.    Dispense:  90 tablet    Refill:  3   albuterol (VENTOLIN HFA) 108 (90 Base) MCG/ACT inhaler    Sig: Inhale 2 puffs into the lungs every 6 (six) hours as needed for wheezing or shortness of breath.    Dispense:  8 g    Refill:  2    Patient prefers Ventolin   Return in about 6 months (around 04/11/2023).  I have spent a total time of 35-minutes on the day of the appointment including chart review, data review, collecting history, coordinating care and discussing medical diagnosis and plan with the patient/family. Past medical history, allergies, medications were reviewed. Pertinent imaging, labs and tests included in this note have been reviewed and interpreted independently by me.  [place], MD [facility] 10/11/2022 9:51 PM  Office Number [phone]

## 2022-10-14 NOTE — Telephone Encounter (Signed)
Called patient regarding Xolair defective syringe. Patient states she pulled box open and medication flew open. Denies any needlestck injurty. She was able to use syringe and manually administer. Tolerated without issue.  Submitted FDA Medwatch report today  Knox Saliva, PharmD, MPH, BCPS, CPP Clinical Pharmacist (Rheumatology and Pulmonology)

## 2022-10-21 ENCOUNTER — Other Ambulatory Visit: Payer: Self-pay | Admitting: Pulmonary Disease

## 2022-10-28 ENCOUNTER — Other Ambulatory Visit (HOSPITAL_COMMUNITY): Payer: Self-pay

## 2022-10-28 ENCOUNTER — Other Ambulatory Visit: Payer: Self-pay | Admitting: Pulmonary Disease

## 2022-10-28 ENCOUNTER — Encounter: Payer: Self-pay | Admitting: Family Medicine

## 2022-10-28 DIAGNOSIS — E039 Hypothyroidism, unspecified: Secondary | ICD-10-CM

## 2022-10-28 DIAGNOSIS — I10 Essential (primary) hypertension: Secondary | ICD-10-CM

## 2022-10-28 DIAGNOSIS — J4550 Severe persistent asthma, uncomplicated: Secondary | ICD-10-CM

## 2022-10-28 DIAGNOSIS — E782 Mixed hyperlipidemia: Secondary | ICD-10-CM

## 2022-10-28 MED ORDER — THYROID 60 MG PO TABS: 60.0000 mg | ORAL_TABLET | Freq: Every day | ORAL | 0 refills | Status: DC

## 2022-10-28 MED ORDER — SIMVASTATIN 40 MG PO TABS: 40.0000 mg | ORAL_TABLET | Freq: Every day | ORAL | 3 refills | Status: DC

## 2022-10-28 MED ORDER — LISINOPRIL 5 MG PO TABS: 5.0000 mg | ORAL_TABLET | Freq: Every day | ORAL | 0 refills | Status: DC

## 2022-10-28 MED ORDER — ARMOUR THYROID 15 MG PO TABS: 15.0000 mg | ORAL_TABLET | Freq: Every day | ORAL | 0 refills | Status: DC

## 2022-10-29 ENCOUNTER — Other Ambulatory Visit: Payer: Self-pay

## 2022-10-29 ENCOUNTER — Other Ambulatory Visit (HOSPITAL_COMMUNITY): Payer: Self-pay

## 2022-10-29 MED ORDER — OMALIZUMAB 150 MG/ML ~~LOC~~ SOSY
150.0000 mg | PREFILLED_SYRINGE | SUBCUTANEOUS | 1 refills | Status: DC
  Filled 2022-10-29: qty 1, 28d supply, fill #0
  Filled 2022-11-21: qty 1, 28d supply, fill #1
  Filled 2022-12-24: qty 1, 28d supply, fill #2
  Filled 2023-01-21: qty 1, 28d supply, fill #3
  Filled 2023-02-18: qty 1, 28d supply, fill #4
  Filled 2023-03-26: qty 1, 28d supply, fill #5

## 2022-10-29 NOTE — Telephone Encounter (Signed)
Refill sent for [facility] to [place]: [phone]   Dose: 150 mg SQ every 4 weeks  Last OV: 10/11/2022 Provider: Dr. Loanne Drilling  Next OV: 6 months (not yet scheduled)  Knox Saliva, PharmD, MPH, BCPS Clinical Pharmacist (Rheumatology and Pulmonology)

## 2022-10-30 ENCOUNTER — Other Ambulatory Visit: Payer: Self-pay

## 2022-11-21 ENCOUNTER — Other Ambulatory Visit (HOSPITAL_COMMUNITY): Payer: Self-pay

## 2022-11-22 ENCOUNTER — Other Ambulatory Visit: Payer: Self-pay | Admitting: Family Medicine

## 2022-11-22 DIAGNOSIS — E782 Mixed hyperlipidemia: Secondary | ICD-10-CM

## 2022-11-26 ENCOUNTER — Other Ambulatory Visit (HOSPITAL_COMMUNITY): Payer: Self-pay

## 2022-12-20 ENCOUNTER — Other Ambulatory Visit: Payer: Self-pay | Admitting: Pulmonary Disease

## 2022-12-24 ENCOUNTER — Other Ambulatory Visit: Payer: Self-pay

## 2022-12-27 ENCOUNTER — Other Ambulatory Visit: Payer: Self-pay

## 2023-01-20 ENCOUNTER — Other Ambulatory Visit: Payer: Self-pay | Admitting: Family Medicine

## 2023-01-21 ENCOUNTER — Other Ambulatory Visit (HOSPITAL_COMMUNITY): Payer: Self-pay

## 2023-01-24 ENCOUNTER — Other Ambulatory Visit: Payer: Self-pay

## 2023-01-27 ENCOUNTER — Other Ambulatory Visit (HOSPITAL_COMMUNITY): Payer: Self-pay

## 2023-01-29 ENCOUNTER — Other Ambulatory Visit: Payer: Self-pay | Admitting: Family Medicine

## 2023-01-29 DIAGNOSIS — E039 Hypothyroidism, unspecified: Secondary | ICD-10-CM

## 2023-01-29 DIAGNOSIS — E782 Mixed hyperlipidemia: Secondary | ICD-10-CM

## 2023-02-18 ENCOUNTER — Other Ambulatory Visit (HOSPITAL_COMMUNITY): Payer: Self-pay

## 2023-02-26 ENCOUNTER — Other Ambulatory Visit (HOSPITAL_COMMUNITY): Payer: Self-pay

## 2023-03-26 ENCOUNTER — Other Ambulatory Visit (HOSPITAL_COMMUNITY): Payer: Self-pay

## 2023-03-26 ENCOUNTER — Other Ambulatory Visit: Payer: Self-pay

## 2023-03-31 ENCOUNTER — Other Ambulatory Visit (HOSPITAL_COMMUNITY): Payer: Self-pay

## 2023-04-03 ENCOUNTER — Other Ambulatory Visit: Payer: Self-pay | Admitting: Family Medicine

## 2023-04-03 ENCOUNTER — Encounter: Payer: Self-pay | Admitting: *Deleted

## 2023-04-03 DIAGNOSIS — I10 Essential (primary) hypertension: Secondary | ICD-10-CM

## 2023-04-07 ENCOUNTER — Encounter (INDEPENDENT_AMBULATORY_CARE_PROVIDER_SITE_OTHER): Payer: BC Managed Care – PPO | Admitting: Family Medicine

## 2023-04-07 DIAGNOSIS — K5792 Diverticulitis of intestine, part unspecified, without perforation or abscess without bleeding: Secondary | ICD-10-CM | POA: Diagnosis not present

## 2023-04-07 MED ORDER — AMOXICILLIN-POT CLAVULANATE 875-125 MG PO TABS
1.0000 | ORAL_TABLET | Freq: Two times a day (BID) | ORAL | 0 refills | Status: DC
Start: 2023-04-07 — End: 2023-04-18

## 2023-04-07 NOTE — Telephone Encounter (Signed)
Please see the MyChart message reply(ies) for my assessment and plan.  The patient gave consent for this Medical Advice Message and is aware that it may result in a bill to their insurance company as well as the possibility that this may result in a co-payment or deductible. They are an established patient, but are not seeking medical advice exclusively about a problem treated during an in person or video visit in the last 7 days. I did not recommend an in person or video visit within 7 days of my reply.  I spent a total of 10 minutes cumulative time within 7 days through MyChart messaging Iretha Kirley, MD  

## 2023-04-17 ENCOUNTER — Other Ambulatory Visit: Payer: Self-pay | Admitting: Family Medicine

## 2023-04-18 ENCOUNTER — Encounter (HOSPITAL_BASED_OUTPATIENT_CLINIC_OR_DEPARTMENT_OTHER): Payer: Self-pay | Admitting: Pulmonary Disease

## 2023-04-18 ENCOUNTER — Ambulatory Visit (HOSPITAL_BASED_OUTPATIENT_CLINIC_OR_DEPARTMENT_OTHER): Payer: BC Managed Care – PPO | Admitting: Pulmonary Disease

## 2023-04-18 VITALS — BP 126/68 | HR 61 | Temp 98.0°F | Ht 65.0 in | Wt 165.4 lb

## 2023-04-18 DIAGNOSIS — Z7952 Long term (current) use of systemic steroids: Secondary | ICD-10-CM

## 2023-04-18 DIAGNOSIS — J4550 Severe persistent asthma, uncomplicated: Secondary | ICD-10-CM | POA: Diagnosis not present

## 2023-04-18 NOTE — Patient Instructions (Addendum)
  Severe persistent asthma - well controlled symptoms, breakthrough symptoms prior to re-injection CONTINUE Xolair monthly at home.  CONTINUE Advair 500-50 mcg daily. This is your EVERYDAY inhaler.  CONTINUE Albuterol inhaler as needed. This is your rescue inhaler CONTINUE singulair 10 mg daily. REFILL CONTINUE Zyrtec 10 mg daily

## 2023-04-18 NOTE — Progress Notes (Signed)
Subjective:   PATIENT ID: Amanda [name] GENDER: female DOB: 12-26-1955, MRN: [id number]   HPI  Chief Complaint  Patient presents with   Follow-up    SOB and wheeze increased x 6 months.  Will need refills.    Reason for Visit: Follow-up asthma  Ms. Delisa Kendzierski is a 67 year old never smoker with severe persistent asthma with steroid dependence currently on Xolair, seasonal allergic rhinitis who present for follow-up for asthma  Synopsis She was previously a patient of Dr. Kriste Basque, whom she followed for many years. Last seen in October 2019 with stable asthma symptoms during that visit. Symptoms are triggered by exertion, illness, strong odors and allergies. Denies nocturnal symptoms.  She reports she overall well controlled on Xolair and Advair 500-50 mcg once a day. She has had to be increased Advair to twice a day when she exerts herself more and during changes of seasons.   2023 - Well-controlled Xolair and Advair 500-50 mcg once a day. During the spring and winter she may need her inhaler twice a day for worsening symptoms. Usually uses albuterol twice a week. Notices worsening symptoms with wheezing and shortness of breath when she is nearing the time to re-dose her biologic and Advair.   10/11/22 She is compliant with her Advair 500-50 mcg daily. Compliant with Xolair. Reports recent issue with spring loaded injection so had to manually injection. She uses albuterol once a week. Changes in weather were her worst times and will have cough and wheeze. Not in exacerbation. She had strep throat in Oct/Nov and treated with penicillin and steroids. Denies asthma flare   04/18/23 Overall asthma is doing well, not using albuterol often unless ill. She reports in the last two months her mother was ill and her passed away. She has slightly increased cough and wheeze with the heat and recent stress. She has increased cough, wheezing and shortness of breath prior to re-injection of  Xolair but this improves with biologic administration. Has not needed antibiotics or steroids since last visit. Reports 1-2 nights awakenings due to nocturia.   Asthma Control Test ACT Total Score  04/18/2023  9:47 AM 18  10/11/2022  8:53 AM 21  04/17/2022  9:24 AM 18   Social History: Has Engineer, building services controller in [employer] exposures: Cats  Past Medical History:  Diagnosis Date   Allergic rhinitis    Asthma    Diverticulitis    Gastroesophageal reflux disease with hiatal hernia    History of shingles    Hyperlipidemia    Hypothyroidism    Osteoarthritis    Overweight(278.02)    Vitamin D deficiency     Allergies  Allergen Reactions   Azithromycin     zpak does not work for the pt     Outpatient Medications Prior to Visit  Medication Sig Dispense Refill   albuterol (VENTOLIN HFA) 108 (90 Base) MCG/ACT inhaler Inhale 2 puffs into the lungs every 6 (six) hours as needed for wheezing or shortness of breath. 8 g 2   ARMOUR THYROID 15 MG tablet TAKE 1 TABLET BY MOUTH DAILY . TAKE WITH 60 MG TO EQUAL 75 MG TOTAL 90 tablet 0   ARMOUR THYROID 60 MG tablet TAKE 1 TABLET BY MOUTH DAILY BEFORE BREAKFAST . TAKE WITH 15 MG TO EQUAL 75 MG TOTAL 90 tablet 0   cetirizine (ZYRTEC) 10 MG tablet Take 10 mg by mouth daily as needed.     Cholecalciferol 50 MCG (2000 UT)  CAPS Take 6,000 Units by mouth.     EPINEPHrine 0.3 mg/0.3 mL IJ SOAJ injection INJECT 1 PEN INTO THE MUSCLE AS NEEDED FOR ANAPHYLAXIS. 2 each 2   fluticasone-salmeterol (ADVAIR DISKUS) 500-50 MCG/ACT AEPB Inhale 1 puff into the lungs in the morning and at bedtime. 60 each 11   lisinopril (ZESTRIL) 5 MG tablet TAKE 1 TABLET (5 MG TOTAL) BY MOUTH DAILY. 90 tablet 0   montelukast (SINGULAIR) 10 MG tablet TAKE 1 TABLET BY MOUTH EVERYDAY AT BEDTIME 90 tablet 1   omalizumab (XOLAIR) 150 MG/ML prefilled syringe Inject 150 mg into the skin every 28 (twenty-eight) days. 3 mL 1   pantoprazole (PROTONIX) 40 MG tablet  Take 1 tablet (40 mg total) by mouth 2 (two) times daily. (Patient taking differently: Take 40 mg by mouth daily.) 180 tablet 0   simvastatin (ZOCOR) 40 MG tablet TAKE 1 TABLET BY MOUTH DAILY 90 tablet 3   amoxicillin-clavulanate (AUGMENTIN) 875-125 MG tablet Take 1 tablet by mouth 2 (two) times daily. (Patient not taking: Reported on 04/18/2023) 20 tablet 0   No facility-administered medications prior to visit.    Review of Systems  Constitutional:  Negative for chills, diaphoresis, fever, malaise/fatigue and weight loss.  HENT:  Positive for congestion.   Respiratory:  Positive for cough. Negative for hemoptysis, sputum production, shortness of breath and wheezing.   Cardiovascular:  Negative for chest pain, palpitations and leg swelling.   Objective:   Vitals:   04/18/23 0944  BP: 126/68  Pulse: 61  Temp: 98 F (36.7 C)  TempSrc: Oral  SpO2: 99%  Weight: 165 lb 6.4 oz (75 kg)  Height: 5\' 5"  (1.651 m)   SpO2: 99 %  Physical Exam: General: Well-appearing, no acute distress HENT: [place], AT Eyes: EOMI, no scleral icterus Respiratory: Clear to auscultation bilaterally.  No crackles, wheezing or rales Cardiovascular: RRR, -M/R/G, no JVD Extremities:-Edema,-tenderness Neuro: AAO x4, CNII-XII grossly intact Psych: Normal mood, normal affect  Data Reviewed:  Imaging: CXR 01/27/17 - no acute cardiopulmonary abnormalities. No infiltrate, effusion or edema  PFT: 01/16/16 - Ratio 58 FEV1 43% FVC 58% Interpretation: Severe obstructive defect  Labs: CBC with diff 08/06/18 - WBC 8.5 with Eos 5.3% (400 absolute eos) CBC with 07/18/21 - Absolute eos 300    Assessment & Plan:   Discussion: 67 year old female with severe persistent asthma with prior steroid dependence. Well controlled on current regiment since Xolair since 2017. Mild breakthrough symptoms when it is time for re-injection. Fortunately no exacerbations > 4 years. Recommend continuing current regimen with high dose  ICS/LABA and biologic. Could consider LAMA add-on however patient declined and satisfied with her inhalers. Discussed clinical course and management of asthma including bronchodilator regimen, preventive care and action plan for exacerbation.   Severe persistent asthma - well controlled symptoms, breakthrough symptoms nearing injection time CONTINUE Xolair monthly at home.  CONTINUE Advair 500-50 mcg daily. This is your EVERYDAY inhaler.  CONTINUE Albuterol inhaler as needed. This is your rescue inhaler CONTINUE singulair 10 mg daily. REFILL CONTINUE Zyrtec 10 mg daily  Asthma Action Plan Increase Advair to ONE puff twice a day for worsening shortness of breath, wheezing and cough. If you symptoms do not improve in 24-48 hours, please our office for evaluation and/or prednisone taper.  Health Maintenance Immunization History  Administered Date(s) Administered   PFIZER(Purple Top)SARS-COV-2 Vaccination 01/14/2020, 04/14/2020   Tdap 01/21/2011   No orders of the defined types were placed in this encounter.  No orders of  the defined types were placed in this encounter.  Return in about 6 months (around 10/19/2023).  I have spent a total time of 30-minutes on the day of the appointment including chart review, data review, collecting history, coordinating care and discussing medical diagnosis and plan with the patient/family. Past medical history, allergies, medications were reviewed. Pertinent imaging, labs and tests included in this note have been reviewed and interpreted independently by me.  Brynnley Dayrit Mechele Collin, MD [facility] 04/18/2023 10:20 AM  Office Number [phone]

## 2023-04-22 ENCOUNTER — Encounter (HOSPITAL_BASED_OUTPATIENT_CLINIC_OR_DEPARTMENT_OTHER): Payer: Self-pay | Admitting: Pulmonary Disease

## 2023-04-22 DIAGNOSIS — J4550 Severe persistent asthma, uncomplicated: Secondary | ICD-10-CM

## 2023-04-23 ENCOUNTER — Other Ambulatory Visit: Payer: Self-pay | Admitting: Pulmonary Disease

## 2023-04-23 ENCOUNTER — Other Ambulatory Visit: Payer: Self-pay

## 2023-04-23 ENCOUNTER — Other Ambulatory Visit (HOSPITAL_COMMUNITY): Payer: Self-pay

## 2023-04-23 DIAGNOSIS — J4550 Severe persistent asthma, uncomplicated: Secondary | ICD-10-CM

## 2023-04-23 MED ORDER — OMALIZUMAB 150 MG/ML ~~LOC~~ SOSY
150.0000 mg | PREFILLED_SYRINGE | SUBCUTANEOUS | 1 refills | Status: DC
Start: 2023-04-23 — End: 2023-10-15
  Filled 2023-04-23: qty 1, 28d supply, fill #0
  Filled 2023-05-26: qty 1, 28d supply, fill #1
  Filled 2023-06-19: qty 1, 28d supply, fill #2
  Filled 2023-07-22: qty 1, 28d supply, fill #3
  Filled 2023-08-26: qty 1, 28d supply, fill #4
  Filled 2023-09-19: qty 1, 28d supply, fill #5

## 2023-04-25 NOTE — Progress Notes (Unsigned)
[facility] [address], [address] [place], [place] [address] [phone]  Date:  04/30/2023   Name:  Amanda [name]   DOB:  1956/03/17   MRN:  [id number]  PCP:  Amanda Cables, MD    Chief Complaint: No chief complaint on file.   History of Present Illness:  Amanda [name] is a 67 y.o. very pleasant female patient who presents with the following:  Patient seen today for medication follow-up Most recent visit with myself was in October History of asthma, venous insufficiency, allergies, allergies, hypothyroidism, hyperlipidemia.   She was seen by her pulmonologist, Dr. Everardo All most recently last month to follow-up her severe persistent asthma: CONTINUE Xolair monthly at home.  CONTINUE Advair 500-50 mcg daily. This is your EVERYDAY inhaler.  CONTINUE Albuterol inhaler as needed. This is your rescue inhaler CONTINUE singulair 10 mg daily. REFILL CONTINUE Zyrtec 10 mg daily  Tetanus booster Shingrix Recommend pneumonia Remind patient, Cologuard due in September Patient Active Problem List   Diagnosis Date Noted   Left lower quadrant abdominal pain 07/18/2021   Urinary frequency 07/18/2021   Severe persistent asthma 12/05/2020   Severe persistent asthma dependent on systemic steroids without complication 05/13/2019   Chronic venous insufficiency 07/25/2016   Pain of left leg 03/19/2016   LPRD (laryngopharyngeal reflux disease) 01/16/2016   Vitamin D deficiency 06/22/2009   Backache 09/05/2008   Overweight 04/13/2008   Anxiety state 04/13/2008   Allergic rhinitis 04/13/2008   Diaphragmatic hernia 04/13/2008   Osteoarthritis 04/13/2008   Hypothyroidism 04/12/2008   Hyperlipidemia 04/12/2008    Past Medical History:  Diagnosis Date   Allergic rhinitis    Asthma    Diverticulitis    Gastroesophageal reflux disease with hiatal hernia    History of shingles    Hyperlipidemia    Hypothyroidism     Osteoarthritis    Overweight(278.02)    Vitamin D deficiency     Past Surgical History:  Procedure Laterality Date   breast reductions  2000   in [place]   COLONOSCOPY     left knee arthroscopy  1999   by Dr. Renae Fickle   REDUCTION MAMMAPLASTY      Social History   Tobacco Use   Smoking status: Never   Smokeless tobacco: Never  Vaping Use   Vaping status: Never Used  Substance Use Topics   Alcohol use: Yes    Comment: social use   Drug use: No    Family History  Problem Relation Age of Onset   Lung cancer Sister    Heart disease Maternal Grandfather    Colon cancer Neg Hx    Stomach cancer Neg Hx    Rectal cancer Neg Hx     Allergies  Allergen Reactions   Azithromycin     zpak does not work for the pt    Medication list has been reviewed and updated.  Current Outpatient Medications on File Prior to Visit  Medication Sig Dispense Refill   albuterol (VENTOLIN HFA) 108 (90 Base) MCG/ACT inhaler Inhale 2 puffs into the lungs every 6 (six) hours as needed for wheezing or shortness of breath. 8 g 2   ARMOUR THYROID 15 MG tablet TAKE 1 TABLET BY MOUTH DAILY . TAKE WITH 60 MG TO EQUAL 75 MG TOTAL 90 tablet 0   ARMOUR THYROID 60 MG tablet TAKE 1 TABLET BY MOUTH DAILY BEFORE BREAKFAST . TAKE WITH 15 MG TO EQUAL 75  MG TOTAL 90 tablet 0   cetirizine (ZYRTEC) 10 MG tablet Take 10 mg by mouth daily as needed.     Cholecalciferol 50 MCG (2000 UT) CAPS Take 6,000 Units by mouth.     EPINEPHrine 0.3 mg/0.3 mL IJ SOAJ injection INJECT 1 PEN INTO THE MUSCLE AS NEEDED FOR ANAPHYLAXIS. 2 each 2   fluticasone-salmeterol (ADVAIR DISKUS) 500-50 MCG/ACT AEPB Inhale 1 puff into the lungs in the morning and at bedtime. 60 each 11   lisinopril (ZESTRIL) 5 MG tablet TAKE 1 TABLET (5 MG TOTAL) BY MOUTH DAILY. 90 tablet 0   montelukast (SINGULAIR) 10 MG tablet TAKE 1 TABLET BY MOUTH EVERYDAY AT BEDTIME 90 tablet 1   omalizumab (XOLAIR) 150 MG/ML prefilled syringe Inject 150 mg into the skin  every 28 (twenty-eight) days. 3 mL 1   pantoprazole (PROTONIX) 40 MG tablet Take 1 tablet (40 mg total) by mouth 2 (two) times daily. (Patient taking differently: Take 40 mg by mouth daily.) 180 tablet 0   simvastatin (ZOCOR) 40 MG tablet TAKE 1 TABLET BY MOUTH DAILY 90 tablet 3   No current facility-administered medications on file prior to visit.    Review of Systems:  As per HPI- otherwise negative.   Physical Examination: There were no vitals filed for this visit. There were no vitals filed for this visit. There is no height or weight on file to calculate BMI. Ideal Body Weight:    GEN: no acute distress. HEENT: Atraumatic, Normocephalic.  Ears and Nose: No external deformity. CV: RRR, No M/G/R. No JVD. No thrill. No extra heart sounds. PULM: CTA B, no wheezes, crackles, rhonchi. No retractions. No resp. distress. No accessory muscle use. ABD: S, NT, ND, +BS. No rebound. No HSM. EXTR: No c/c/e PSYCH: Normally interactive. Conversant.    Assessment and Plan: ***  Signed Abbe Amsterdam, MD

## 2023-04-30 ENCOUNTER — Ambulatory Visit: Payer: BC Managed Care – PPO | Admitting: Family Medicine

## 2023-04-30 VITALS — BP 120/72 | HR 67 | Temp 97.8°F | Resp 18 | Ht 65.0 in | Wt 164.2 lb

## 2023-04-30 DIAGNOSIS — Z1239 Encounter for other screening for malignant neoplasm of breast: Secondary | ICD-10-CM

## 2023-04-30 DIAGNOSIS — M21612 Bunion of left foot: Secondary | ICD-10-CM

## 2023-04-30 DIAGNOSIS — I10 Essential (primary) hypertension: Secondary | ICD-10-CM

## 2023-04-30 DIAGNOSIS — R5383 Other fatigue: Secondary | ICD-10-CM | POA: Diagnosis not present

## 2023-04-30 DIAGNOSIS — E782 Mixed hyperlipidemia: Secondary | ICD-10-CM

## 2023-04-30 DIAGNOSIS — M21611 Bunion of right foot: Secondary | ICD-10-CM

## 2023-04-30 DIAGNOSIS — K5792 Diverticulitis of intestine, part unspecified, without perforation or abscess without bleeding: Secondary | ICD-10-CM

## 2023-04-30 DIAGNOSIS — Z131 Encounter for screening for diabetes mellitus: Secondary | ICD-10-CM | POA: Diagnosis not present

## 2023-04-30 DIAGNOSIS — E039 Hypothyroidism, unspecified: Secondary | ICD-10-CM | POA: Diagnosis not present

## 2023-04-30 MED ORDER — LISINOPRIL 5 MG PO TABS
5.0000 mg | ORAL_TABLET | Freq: Every day | ORAL | 3 refills | Status: DC
Start: 2023-04-30 — End: 2023-07-02

## 2023-04-30 NOTE — Patient Instructions (Signed)
It was good to see you today, I will be in touch with your labs soon as possible  Referral made to podiatry to look at your bunions  Mammogram ordered  Let me know if you would like to do the CT coronary calcium score test  As we discussed, if you suspect diverticulitis is acting up go on liquids only for 12 to 24 hours.  If this fails to improve you please let me know, we can start antibiotics if needed If you start to have very severe or frequent symptoms we could consider a partial colectomy

## 2023-05-01 ENCOUNTER — Encounter: Payer: Self-pay | Admitting: Family Medicine

## 2023-05-01 LAB — COMPREHENSIVE METABOLIC PANEL
ALT: 10 U/L (ref 0–35)
AST: 11 U/L (ref 0–37)
Albumin: 4.3 g/dL (ref 3.5–5.2)
BUN: 9 mg/dL (ref 6–23)
CO2: 31 mEq/L (ref 19–32)
GFR: 91.39 mL/min (ref >60.00)
Glucose, Bld: 85 mg/dL (ref 70–99)
Potassium: 4.2 mEq/L (ref 3.5–5.1)
Sodium: 142 mEq/L (ref 135–145)
Total Bilirubin: 0.5 mg/dL (ref 0.2–1.2)

## 2023-05-01 LAB — LIPID PANEL
Cholesterol: 161 mg/dL (ref 0–200)
HDL: 49.8 mg/dL (ref >39.00)
LDL Cholesterol: 72 mg/dL (ref 0–99)
Triglycerides: 197 mg/dL — ABNORMAL HIGH (ref 0.0–149.0)
VLDL: 39.4 mg/dL (ref 0.0–40.0)

## 2023-05-01 LAB — CBC
HCT: 40.8 % (ref 36.0–46.0)
Hemoglobin: 13.4 g/dL (ref 12.0–15.0)
MCHC: 32.9 g/dL (ref 30.0–36.0)
Platelets: 225 10*3/uL (ref 150.0–400.0)
RBC: 4.38 Mil/uL (ref 3.87–5.11)
RDW: 13.4 % (ref 11.5–15.5)
WBC: 7.1 10*3/uL (ref 4.0–10.5)

## 2023-05-01 LAB — VITAMIN D 25 HYDROXY (VIT D DEFICIENCY, FRACTURES): VITD: 36.01 ng/mL (ref 30.00–100.00)

## 2023-05-01 LAB — HEMOGLOBIN A1C: Hgb A1c MFr Bld: 6 % (ref 4.6–6.5)

## 2023-05-01 LAB — TSH: TSH: 0.53 u[IU]/mL (ref 0.35–5.50)

## 2023-05-01 MED ORDER — THYROID 60 MG PO TABS
60.0000 mg | ORAL_TABLET | Freq: Every day | ORAL | 3 refills | Status: DC
Start: 2023-05-01 — End: 2024-04-21

## 2023-05-01 MED ORDER — ARMOUR THYROID 15 MG PO TABS
15.0000 mg | ORAL_TABLET | Freq: Every day | ORAL | 3 refills | Status: DC
Start: 2023-05-01 — End: 2023-10-15

## 2023-05-01 NOTE — Addendum Note (Signed)
Addended by: Abbe Amsterdam C on: 05/01/2023 11:44 AM   Modules accepted: Orders

## 2023-05-02 ENCOUNTER — Other Ambulatory Visit: Payer: Self-pay

## 2023-05-06 ENCOUNTER — Ambulatory Visit (HOSPITAL_BASED_OUTPATIENT_CLINIC_OR_DEPARTMENT_OTHER)
Admission: RE | Admit: 2023-05-06 | Discharge: 2023-05-06 | Disposition: A | Discharge status: Home / Self Care | Payer: BC Managed Care – PPO | Source: Ambulatory Visit | Attending: Family Medicine | Admitting: Family Medicine

## 2023-05-06 ENCOUNTER — Encounter (HOSPITAL_BASED_OUTPATIENT_CLINIC_OR_DEPARTMENT_OTHER): Payer: Self-pay

## 2023-05-06 DIAGNOSIS — Z1239 Encounter for other screening for malignant neoplasm of breast: Secondary | ICD-10-CM

## 2023-05-06 DIAGNOSIS — Z1231 Encounter for screening mammogram for malignant neoplasm of breast: Secondary | ICD-10-CM | POA: Insufficient documentation

## 2023-05-26 ENCOUNTER — Other Ambulatory Visit: Payer: Self-pay

## 2023-05-30 ENCOUNTER — Other Ambulatory Visit (HOSPITAL_COMMUNITY): Payer: Self-pay

## 2023-06-17 ENCOUNTER — Other Ambulatory Visit: Payer: Self-pay | Admitting: Pulmonary Disease

## 2023-06-18 ENCOUNTER — Other Ambulatory Visit (HOSPITAL_COMMUNITY): Payer: Self-pay

## 2023-06-19 ENCOUNTER — Other Ambulatory Visit (HOSPITAL_COMMUNITY): Payer: Self-pay

## 2023-06-30 ENCOUNTER — Other Ambulatory Visit: Payer: Self-pay

## 2023-07-02 ENCOUNTER — Other Ambulatory Visit: Payer: Self-pay | Admitting: Family Medicine

## 2023-07-02 DIAGNOSIS — I10 Essential (primary) hypertension: Secondary | ICD-10-CM

## 2023-07-05 ENCOUNTER — Other Ambulatory Visit: Payer: Self-pay | Admitting: Pulmonary Disease

## 2023-07-12 ENCOUNTER — Encounter (HOSPITAL_BASED_OUTPATIENT_CLINIC_OR_DEPARTMENT_OTHER): Payer: Self-pay | Admitting: Pulmonary Disease

## 2023-07-13 ENCOUNTER — Other Ambulatory Visit: Payer: Self-pay | Admitting: Family Medicine

## 2023-07-14 ENCOUNTER — Other Ambulatory Visit (HOSPITAL_COMMUNITY): Payer: Self-pay

## 2023-07-14 MED ORDER — ALBUTEROL SULFATE HFA 108 (90 BASE) MCG/ACT IN AERS: 2.0000 | INHALATION_SPRAY | Freq: Four times a day (QID) | RESPIRATORY_TRACT | 2 refills | Status: DC | PRN

## 2023-07-15 MED ORDER — FLUTICASONE-SALMETEROL 500-50 MCG/ACT IN AEPB: 1.0000 | INHALATION_SPRAY | Freq: Two times a day (BID) | RESPIRATORY_TRACT | 11 refills | Status: DC

## 2023-07-15 NOTE — Addendum Note (Signed)
Addended by: Luciano Cutter on: 07/15/2023 08:27 AM   Modules accepted: Orders

## 2023-07-22 ENCOUNTER — Other Ambulatory Visit: Payer: Self-pay

## 2023-07-22 NOTE — Progress Notes (Signed)
Specialty Pharmacy Refill Coordination Note  Amanda [name] is a 67 y.o. female contacted today regarding refills of specialty medication(s) Omalizumab   Patient requested Delivery   Delivery date: 07/31/23   Verified address: [address]   [place] [address]   Medication will be filled on 07/30/23.

## 2023-07-25 MED ORDER — FLUTICASONE-SALMETEROL 500-50 MCG/ACT IN AEPB: 1.0000 | INHALATION_SPRAY | Freq: Two times a day (BID) | RESPIRATORY_TRACT | 11 refills | Status: DC

## 2023-07-25 NOTE — Telephone Encounter (Signed)
Prescription printed and at [facility] for pick up.

## 2023-07-25 NOTE — Addendum Note (Signed)
Addended by: Jama Flavors on: 07/25/2023 10:50 AM   Modules accepted: Orders

## 2023-07-30 ENCOUNTER — Other Ambulatory Visit (HOSPITAL_COMMUNITY): Payer: Self-pay

## 2023-08-15 ENCOUNTER — Other Ambulatory Visit (HOSPITAL_COMMUNITY): Payer: Self-pay

## 2023-08-19 ENCOUNTER — Other Ambulatory Visit (HOSPITAL_COMMUNITY): Payer: Self-pay

## 2023-08-26 ENCOUNTER — Other Ambulatory Visit: Payer: Self-pay

## 2023-08-26 NOTE — Progress Notes (Signed)
Specialty Pharmacy Refill Coordination Note  Amanda [name] is a 67 y.o. female contacted today regarding refills of specialty medication(s) Omalizumab   Patient requested Delivery   Delivery date: 08/28/23   Verified address: [address]   [place] [address]   Medication will be filled on 08/26/23.

## 2023-08-27 ENCOUNTER — Other Ambulatory Visit (HOSPITAL_COMMUNITY): Payer: Self-pay

## 2023-09-19 ENCOUNTER — Other Ambulatory Visit: Payer: Self-pay

## 2023-09-19 NOTE — Progress Notes (Signed)
Specialty Pharmacy Refill Coordination Note  Amanda [name] is a 67 y.o. female contacted today regarding refills of specialty medication(s) Omalizumab [name])   Patient requested Delivery   Delivery date: 09/25/23   Verified address: [address] [place] [address]   Medication will be filled on 09/24/23.

## 2023-09-24 ENCOUNTER — Other Ambulatory Visit: Payer: Self-pay

## 2023-09-24 ENCOUNTER — Other Ambulatory Visit (HOSPITAL_COMMUNITY): Payer: Self-pay

## 2023-09-24 ENCOUNTER — Telehealth: Payer: Self-pay

## 2023-09-24 NOTE — Telephone Encounter (Signed)
Received notification from [facility] pharmacy that patient requires a new authorization for their medication.  Submitted an URGENT Prior Authorization request to [employer] for [employer] via CoverMyMeds. Will update once we receive a response.  Key: [id number]

## 2023-09-24 NOTE — Telephone Encounter (Signed)
Received notification from [facility] regarding a prior authorization for XOLAIR. Authorization has been APPROVED from 09/24/2023 to 09/23/2024. Approval letter sent to scan center.  Authorization # [id number]

## 2023-10-15 ENCOUNTER — Encounter (INDEPENDENT_AMBULATORY_CARE_PROVIDER_SITE_OTHER): Payer: BC Managed Care – PPO | Admitting: Family Medicine

## 2023-10-15 ENCOUNTER — Other Ambulatory Visit (HOSPITAL_COMMUNITY): Payer: Self-pay

## 2023-10-15 ENCOUNTER — Other Ambulatory Visit: Payer: Self-pay

## 2023-10-15 ENCOUNTER — Other Ambulatory Visit (HOSPITAL_BASED_OUTPATIENT_CLINIC_OR_DEPARTMENT_OTHER): Payer: Self-pay | Admitting: Pulmonary Disease

## 2023-10-15 DIAGNOSIS — J4550 Severe persistent asthma, uncomplicated: Secondary | ICD-10-CM

## 2023-10-15 DIAGNOSIS — E039 Hypothyroidism, unspecified: Secondary | ICD-10-CM | POA: Diagnosis not present

## 2023-10-15 MED ORDER — OMALIZUMAB 150 MG/ML ~~LOC~~ SOSY
150.0000 mg | PREFILLED_SYRINGE | SUBCUTANEOUS | 1 refills | Status: DC
  Filled 2023-10-15: qty 3, 84d supply, fill #0
  Filled 2023-10-21: qty 1, 28d supply, fill #0
  Filled 2023-11-24: qty 1, 28d supply, fill #1
  Filled 2023-12-23: qty 1, 28d supply, fill #2
  Filled 2024-01-22: qty 1, 28d supply, fill #3
  Filled 2024-02-17: qty 1, 28d supply, fill #4
  Filled 2024-03-23: qty 1, 28d supply, fill #5

## 2023-10-15 MED ORDER — ARMOUR THYROID 15 MG PO TABS: 15.0000 mg | ORAL_TABLET | Freq: Every day | ORAL | 3 refills | Status: DC

## 2023-10-15 NOTE — Addendum Note (Signed)
 Addended by: Abbe Amsterdam C on: 10/15/2023 05:04 PM   Modules accepted: Orders

## 2023-10-15 NOTE — Telephone Encounter (Signed)

## 2023-10-21 ENCOUNTER — Other Ambulatory Visit: Payer: Self-pay

## 2023-10-21 NOTE — Progress Notes (Signed)
 Specialty Pharmacy Refill Coordination Note  Amanda [name] is a 68 y.o. female contacted today regarding refills of specialty medication(s) Omalizumab  (XOLAIR )   Patient requested Delivery   Delivery date: 11/04/23   Verified address: [address] [place] [address]   Medication will be filled on 01.27.25.

## 2023-10-27 ENCOUNTER — Other Ambulatory Visit: Payer: Self-pay | Admitting: Family Medicine

## 2023-11-03 ENCOUNTER — Other Ambulatory Visit: Payer: Self-pay

## 2023-11-19 ENCOUNTER — Other Ambulatory Visit: Payer: Self-pay

## 2023-11-24 ENCOUNTER — Other Ambulatory Visit (HOSPITAL_COMMUNITY): Payer: Self-pay

## 2023-11-24 NOTE — Progress Notes (Signed)
 Specialty Pharmacy Refill Coordination Note  Amanda [name] is a 68 y.o. female contacted today regarding refills of specialty medication(s) Omalizumab [name])   Patient requested Delivery   Delivery date: 12/02/23   Verified address: [address] [place] [address]   Medication will be filled on 12/01/23.

## 2023-11-24 NOTE — Progress Notes (Signed)
 Specialty Pharmacy Ongoing Clinical Assessment Note  Amanda [name] is a 68 y.o. female who is being followed by the specialty pharmacy service for RxSp Asthma/COPD   Patient's specialty medication(s) reviewed today: Omalizumab [name])   Missed doses in the last 4 weeks: 0   Patient/Caregiver did not have any additional questions or concerns.   Therapeutic benefit summary: Patient is achieving benefit   Adverse events/side effects summary: Experienced adverse events/side effects (some bruising at injection site, tolerable, suggested heat before and ice after injection if this continues)   Patient's therapy is appropriate to: Continue    Goals Addressed             This Visit's Progress    Minimize recurrence of flares       Patient is on track. Patient will maintain adherence.  Patient reports that she is very well-controlled on therapy at this time.          Follow up:  6 months  Servando Snare Specialty Pharmacist

## 2023-11-25 ENCOUNTER — Ambulatory Visit (HOSPITAL_BASED_OUTPATIENT_CLINIC_OR_DEPARTMENT_OTHER): Payer: BC Managed Care – PPO | Admitting: Pulmonary Disease

## 2023-11-25 ENCOUNTER — Encounter (HOSPITAL_BASED_OUTPATIENT_CLINIC_OR_DEPARTMENT_OTHER): Payer: Self-pay | Admitting: Pulmonary Disease

## 2023-11-25 VITALS — BP 124/80 | HR 60 | Ht 65.0 in | Wt 170.6 lb

## 2023-11-25 DIAGNOSIS — J4550 Severe persistent asthma, uncomplicated: Secondary | ICD-10-CM | POA: Diagnosis not present

## 2023-11-25 DIAGNOSIS — Z7952 Long term (current) use of systemic steroids: Secondary | ICD-10-CM

## 2023-11-25 MED ORDER — EPINEPHRINE 0.3 MG/0.3ML IJ SOAJ: INTRAMUSCULAR | 2 refills | Status: AC

## 2023-11-25 MED ORDER — ALBUTEROL SULFATE HFA 108 (90 BASE) MCG/ACT IN AERS: 2.0000 | INHALATION_SPRAY | Freq: Four times a day (QID) | RESPIRATORY_TRACT | 3 refills | Status: DC | PRN

## 2023-11-25 NOTE — Patient Instructions (Signed)
 Severe persistent asthma - fairly controlled symptoms, breakthrough symptoms nearing injection time CONTINUE Xolair monthly at home.  CONTINUE Advair 500-50 mcg daily. This is your EVERYDAY inhaler.  CONTINUE Albuterol inhaler as needed. This is your rescue inhaler CONTINUE singulair 10 mg daily. CONTINUE Zyrtec 10 mg daily Recommend aerobic exercise 30 min five days a week

## 2023-11-25 NOTE — Progress Notes (Signed)
 Subjective:   PATIENT ID: Amanda [name] GENDER: female DOB: 1956/09/12, MRN: [id number]   HPI  Chief Complaint  Patient presents with   Follow-up    Asthma    Reason for Visit: Follow-up asthma  [name] is a 68 year old never smoker with severe persistent asthma with steroid dependence currently on Xolair, seasonal allergic rhinitis who present for follow-up for asthma  Synopsis She was previously a patient of Dr. Kriste Basque, whom she followed for many years. Last seen in October 2019 with stable asthma symptoms during that visit. Symptoms are triggered by exertion, illness, strong odors and allergies. Denies nocturnal symptoms.  She reports she overall well controlled on Xolair and Advair 500-50 mcg once a day. She has had to be increased Advair to twice a day when she exerts herself more and during changes of seasons.   2023 - Well-controlled Xolair and Advair 500-50 mcg once a day. During the spring and winter she may need her inhaler twice a day for worsening symptoms. Usually uses albuterol twice a week. Notices worsening symptoms with wheezing and shortness of breath when she is nearing the time to re-dose her biologic and Advair.   10/11/22 She is compliant with her Advair 500-50 mcg daily. Compliant with Xolair. Reports recent issue with spring loaded injection so had to manually injection. She uses albuterol once a week. Changes in weather were her worst times and will have cough and wheeze. Not in exacerbation. She had strep throat in Oct/Nov and treated with penicillin and steroids. Denies asthma flare   04/18/23 Overall asthma is doing well, not using albuterol often unless ill. She reports in the last two months her mother was ill and her passed away. She has slightly increased cough and wheeze with the heat and recent stress. She has increased cough, wheezing and shortness of breath prior to re-injection of Xolair but this improves with biologic  administration. Has not needed antibiotics or steroids since last visit. Reports 1-2 nights awakenings due to nocturia.   11/25/23 Since our last visit she reports overall fair. She reports shortness of breath for the last few months including with bending over, vacuuming. Denies chest pain, palpitations, dizziness or syncope. Denies worsening cough or wheezing. Does not exercise regularly. Denies exacerbations since our last visit. Has hacky cough due to nasal congestion.  Asthma Control Test ACT Total Score  11/25/2023 10:03 AM 19  04/18/2023  9:47 AM 18  10/11/2022  8:53 AM 21   Social History: Has Engineer, building services controller in [employer] exposures: Cats  Past Medical History:  Diagnosis Date   Allergic rhinitis    Asthma    Diverticulitis    Gastroesophageal reflux disease with hiatal hernia    History of shingles    Hyperlipidemia    Hypothyroidism    Osteoarthritis    Overweight(278.02)    Vitamin D deficiency     Allergies  Allergen Reactions   Azithromycin     zpak does not work for the pt     Outpatient Medications Prior to Visit  Medication Sig Dispense Refill   albuterol (VENTOLIN HFA) 108 (90 Base) MCG/ACT inhaler Inhale 2 puffs into the lungs every 6 (six) hours as needed for wheezing or shortness of breath. 18 g 2   ARMOUR THYROID 15 MG tablet Take 1 tablet (15 mg total) by mouth daily. TAKE WITH 60 MG TO EQUAL 75 MG TOTAL 90 tablet 3   fluticasone-salmeterol (WIXELA INHUB) 500-50 MCG/ACT  AEPB Inhale 1 puff into the lungs in the morning and at bedtime. 60 each 11   lisinopril (ZESTRIL) 5 MG tablet TAKE 1 TABLET (5 MG TOTAL) BY MOUTH DAILY. 90 tablet 3   montelukast (SINGULAIR) 10 MG tablet TAKE 1 TABLET BY MOUTH EVERYDAY AT BEDTIME 90 tablet 3   omalizumab (XOLAIR) 150 MG/ML prefilled syringe Inject 150 mg into the skin every 28 (twenty-eight) days. 3 mL 1   pantoprazole (PROTONIX) 40 MG tablet Take 1 tablet (40 mg total) by mouth 2 (two) times  daily. 180 tablet 1   simvastatin (ZOCOR) 40 MG tablet TAKE 1 TABLET BY MOUTH DAILY 90 tablet 3   thyroid (ARMOUR THYROID) 60 MG tablet Take 1 tablet (60 mg total) by mouth daily. 90 tablet 3   cetirizine (ZYRTEC) 10 MG tablet Take 10 mg by mouth daily as needed.     Cholecalciferol 50 MCG (2000 UT) CAPS Take 6,000 Units by mouth.     EPINEPHrine 0.3 mg/0.3 mL IJ SOAJ injection INJECT 1 PEN INTO THE MUSCLE AS NEEDED FOR ANAPHYLAXIS. 2 each 2   No facility-administered medications prior to visit.    Review of Systems  Constitutional:  Negative for chills, diaphoresis, fever, malaise/fatigue and weight loss.  HENT:  Positive for congestion.   Respiratory:  Positive for cough. Negative for hemoptysis, sputum production, shortness of breath and wheezing.   Cardiovascular:  Negative for chest pain, palpitations and leg swelling.   Objective:   Vitals:   11/25/23 0926  BP: 124/80  Pulse: 60  SpO2: 99%  Weight: 170 lb 9.6 oz (77.4 kg)  Height: 5\' 5"  (1.651 m)   SpO2: 99 %  Physical Exam: General: Well-appearing, no acute distress HENT: [place], AT Eyes: EOMI, no scleral icterus Respiratory: Clear to auscultation bilaterally.  No crackles, wheezing or rales Cardiovascular: RRR, -M/R/G, no JVD Extremities:-Edema,-tenderness Neuro: AAO x4, CNII-XII grossly intact Psych: Normal mood, normal affect  Data Reviewed:  Imaging: CXR 01/27/17 - no acute cardiopulmonary abnormalities. No infiltrate, effusion or edema  PFT: 01/16/16 - Ratio 58 FEV1 43% FVC 58% Interpretation: Severe obstructive defect  Labs: CBC with diff 08/06/18 - WBC 8.5 with Eos 5.3% (400 absolute eos) CBC with 07/18/21 - Absolute eos 300    Assessment & Plan:   Discussion: 68 year old female with severe persistent asthma with prior steroid dependence. Overall controlled on current regimen. Has been on Xolair since 2017. No significant exacerbations in >4 hours. Previously offered LAMA add-on but patient declined with  satisfaction with her current regimen. We also discussed consideration of trying alternative biologics since she is having some breakthrough symptoms nearing time of next injection. Discussed clinical course and management of asthma including bronchodilator regimen, preventive care including vaccinations and action plan for exacerbation.  Severe persistent asthma - fairly controlled symptoms, breakthrough symptoms nearing injection time CONTINUE Xolair monthly at home.  CONTINUE Advair 500-50 mcg daily. This is your EVERYDAY inhaler.  CONTINUE Albuterol inhaler as needed. This is your rescue inhaler CONTINUE singulair 10 mg daily. CONTINUE Zyrtec 10 mg daily Recommend aerobic exercise 30 min five days a week   Asthma Action Plan Increase Advair to ONE puff twice a day for worsening shortness of breath, wheezing and cough. If you symptoms do not improve in 24-48 hours, please our office for evaluation and/or prednisone taper.  Health Maintenance Immunization History  Administered Date(s) Administered   PFIZER(Purple Top)SARS-COV-2 Vaccination 01/14/2020, 04/14/2020   Tdap 01/21/2011   No orders of the defined types were  placed in this encounter.  Meds ordered this encounter  Medications   EPINEPHrine 0.3 mg/0.3 mL IJ SOAJ injection    Sig: INJECT 1 PEN INTO THE MUSCLE AS NEEDED FOR ANAPHYLAXIS.    Dispense:  2 each    Refill:  2   albuterol (VENTOLIN HFA) 108 (90 Base) MCG/ACT inhaler    Sig: Inhale 2 puffs into the lungs every 6 (six) hours as needed for wheezing or shortness of breath.    Dispense:  18 g    Refill:  3    Patient prefers Ventolin   Return for October 2025.  I have spent a total time of 36-minutes on the day of the appointment including chart review, data review, collecting history, coordinating care and discussing medical diagnosis and plan with the patient/family. Past medical history, allergies, medications were reviewed. Pertinent imaging, labs and tests  included in this note have been reviewed and interpreted independently by me.  Laquanda Bick Mechele Collin, MD [facility] 11/25/2023 10:05 AM

## 2023-12-01 ENCOUNTER — Other Ambulatory Visit: Payer: Self-pay

## 2023-12-23 ENCOUNTER — Other Ambulatory Visit: Payer: Self-pay

## 2023-12-23 NOTE — Progress Notes (Signed)
 Specialty Pharmacy Refill Coordination Note  Amanda [name] is a 68 y.o. female contacted today regarding refills of specialty medication(s) Omalizumab [name])   Patient requested (Patient-Rptd) Delivery   Delivery date: (Patient-Rptd) 12/31/23   Verified address: (Patient-Rptd) [address] [place], [place] [address]   Medication will be filled on 12/30/23.

## 2023-12-30 ENCOUNTER — Other Ambulatory Visit: Payer: Self-pay

## 2024-01-22 ENCOUNTER — Other Ambulatory Visit: Payer: Self-pay | Admitting: Pharmacy Technician

## 2024-01-22 ENCOUNTER — Other Ambulatory Visit: Payer: Self-pay

## 2024-01-22 NOTE — Progress Notes (Signed)
 Specialty Pharmacy Refill Coordination Note  Amanda [name] is a 68 y.o. female contacted today regarding refills of specialty medication(s) Omalizumab [name])   Patient requested (Patient-Rptd) Delivery   Delivery date: 01/29/24   Verified address: [address] [place], [place] [address]   Medication will be filled on 01/28/24.

## 2024-01-28 ENCOUNTER — Other Ambulatory Visit: Payer: Self-pay

## 2024-01-30 ENCOUNTER — Other Ambulatory Visit: Payer: Self-pay | Admitting: Family Medicine

## 2024-01-30 DIAGNOSIS — E782 Mixed hyperlipidemia: Secondary | ICD-10-CM

## 2024-02-10 ENCOUNTER — Telehealth: Payer: Self-pay

## 2024-02-10 ENCOUNTER — Encounter (INDEPENDENT_AMBULATORY_CARE_PROVIDER_SITE_OTHER): Payer: Self-pay | Admitting: Family Medicine

## 2024-02-10 DIAGNOSIS — K5792 Diverticulitis of intestine, part unspecified, without perforation or abscess without bleeding: Secondary | ICD-10-CM | POA: Diagnosis not present

## 2024-02-10 MED ORDER — AMOXICILLIN-POT CLAVULANATE 875-125 MG PO TABS: 1.0000 | ORAL_TABLET | Freq: Two times a day (BID) | ORAL | 0 refills | Status: DC

## 2024-02-10 NOTE — Telephone Encounter (Signed)
 Copied from CRM [id number]. Topic: Clinical - Medical Advice >> Feb 10, 2024 10:00 AM Amanda [name] wrote: Reason for CRM: Patient called in regarding her diverticulitis flareup, stating it isnt going away wanted to know if Dr.Copland could send in something for her

## 2024-02-10 NOTE — Telephone Encounter (Signed)
 Spoke w/ Pt- she will mychart message PCP.

## 2024-02-10 NOTE — Telephone Encounter (Signed)

## 2024-02-16 ENCOUNTER — Other Ambulatory Visit: Payer: Self-pay

## 2024-02-17 ENCOUNTER — Other Ambulatory Visit: Payer: Self-pay

## 2024-02-17 ENCOUNTER — Other Ambulatory Visit: Payer: Self-pay | Admitting: Pharmacy Technician

## 2024-02-17 NOTE — Progress Notes (Signed)
 Specialty Pharmacy Refill Coordination Note  Amanda [name] is a 68 y.o. female contacted today regarding refills of specialty medication(s) Omalizumab  (XOLAIR )   Patient requested Delivery   Delivery date: 02/27/24   Verified address: [address] [place], [place] [address]   Medication will be filled on 02/26/24.

## 2024-02-19 ENCOUNTER — Other Ambulatory Visit: Payer: Self-pay

## 2024-02-19 ENCOUNTER — Other Ambulatory Visit (HOSPITAL_COMMUNITY): Payer: Self-pay

## 2024-02-19 NOTE — Progress Notes (Signed)
 Clinical Intervention Note  Clinical Intervention Notes: Patient reported medication change, on a short course of Augmentin , no DDIs identified.   Clinical Intervention Outcomes: Prevention of an adverse drug event   Celinda Collar

## 2024-02-26 ENCOUNTER — Other Ambulatory Visit: Payer: Self-pay

## 2024-03-22 ENCOUNTER — Other Ambulatory Visit: Payer: Self-pay

## 2024-03-22 ENCOUNTER — Encounter (INDEPENDENT_AMBULATORY_CARE_PROVIDER_SITE_OTHER): Payer: Self-pay

## 2024-03-23 ENCOUNTER — Other Ambulatory Visit: Payer: Self-pay

## 2024-03-23 ENCOUNTER — Other Ambulatory Visit (HOSPITAL_COMMUNITY): Payer: Self-pay

## 2024-03-23 NOTE — Progress Notes (Signed)
 Specialty Pharmacy Refill Coordination Note  MyChart Questionnaire Submission  Amanda [name] is a 68 y.o. female contacted today regarding refills of specialty medication(s) Xolair .  Injection date: 04/02/24.   Patient requested: (Patient-Rptd) Delivery   Delivery date: 03/24/24   Verified address: (Patient-Rptd) [address]  [place], [place] [address]  Medication will be filled on 03/23/24.

## 2024-04-15 ENCOUNTER — Other Ambulatory Visit (HOSPITAL_BASED_OUTPATIENT_CLINIC_OR_DEPARTMENT_OTHER): Payer: Self-pay | Admitting: Pulmonary Disease

## 2024-04-15 DIAGNOSIS — J4550 Severe persistent asthma, uncomplicated: Secondary | ICD-10-CM

## 2024-04-15 MED ORDER — OMALIZUMAB 150 MG/ML ~~LOC~~ SOSY
150.0000 mg | PREFILLED_SYRINGE | SUBCUTANEOUS | 1 refills | Status: DC
  Filled 2024-04-16 – 2024-04-21 (×2): qty 1, 28d supply, fill #0
  Filled 2024-05-13: qty 1, 28d supply, fill #1
  Filled 2024-06-21: qty 1, 28d supply, fill #2
  Filled 2024-07-20: qty 1, 28d supply, fill #3
  Filled 2024-08-18: qty 1, 28d supply, fill #4
  Filled 2024-09-20: qty 1, 28d supply, fill #5

## 2024-04-15 NOTE — Telephone Encounter (Signed)
 Refill sent for XOLAIR  to [facility]: [phone]   Dose: 150mg  subcut every 4 weeks  Last OV: 11/25/2203 Provider: Dr. Kassie  Next OV: 06/17/2024  Sherry Pennant, PharmD, MPH, BCPS Clinical Pharmacist (Rheumatology and Pulmonology)

## 2024-04-16 ENCOUNTER — Other Ambulatory Visit (HOSPITAL_COMMUNITY): Payer: Self-pay

## 2024-04-16 ENCOUNTER — Other Ambulatory Visit: Payer: Self-pay

## 2024-04-17 NOTE — Progress Notes (Unsigned)
 [facility] [address] [place], [place] [address] [phone]  [phone]  Date:  04/21/2024   Name:  Amanda [name]   DOB:  [birth year]   MRN:  [id number]  PCP:  Watt Harlene BROCKS, MD    Chief Complaint: Follow-up (Pt states when she wakes up in the morning I can barely walk/I have pain in my lower abdomen and goes to my back )   History of Present Illness:  Amanda [name] is a 68 y.o. very pleasant female patient who presents with the following:  Patient seen today for periodic follow-up.  Most recent visit with myself was about a year ago when she was seen with diverticulitis  History of asthma, venous insufficiency, allergies, allergies, hypothyroidism, hyperlipidemia.  She does have pulmonology care for her severe persistent asthma  Most recent visit with Dr.Ellison was in [place] that visit note: Discussion: 68 year old female with severe persistent asthma with prior steroid dependence. Overall controlled on current regimen. Has been on Xolair  since 2017. No significant exacerbations in >4 hours. Previously offered LAMA add-on but patient declined with satisfaction with her current regimen. We also discussed consideration of trying alternative biologics since she is having some breakthrough symptoms nearing time of next injection. Discussed clinical course and management of asthma including bronchodilator regimen, preventive care including vaccinations and action plan for exacerbation Severe persistent asthma - fairly controlled symptoms, breakthrough symptoms nearing injection time CONTINUE Xolair  monthly at home.  CONTINUE Advair 500-50 mcg daily. This is your EVERYDAY inhaler.  CONTINUE Albuterol  inhaler as needed. This is your rescue inhaler CONTINUE singulair  10 mg daily. CONTINUE Zyrtec 10 mg daily Recommend aerobic exercise 30 min five days a week  Asthma Action Plan Increase Advair to ONE puff  twice a day for worsening shortness of breath, wheezing and cough. If you symptoms do not improve in 24-48 hours, please our office for evaluation and/or prednisone  taper.  Recommend tetanus, Shingrix, pneumonia vaccines.  Patient has declined these previously and continues to decline today She had a positive Cologuard in 2022-colonoscopy January 2023 Mammogram can be updated.  Patient declines a clinical breast exam.  She states she has pain in both of her breasts and as such we will order both a diagnostic mammogram and bilateral breast ultrasound Pap smear-she has previously declined-offered again today, she declines DEXA scan 10 years ago Labs 1 year ago  Armour Thyroid  75 mm  Lisinopril  5 Simvastatin  40  Today Terrence notes daily pain in her abdomen for 6-9 months- it mostly bothers her in the am when she wakes up, will resolve after half an hour to an hour upon rising for the day Sometimes she will lie back down in bed due to the pain She wonders if this might be diverticulitis pain Urinating may help some of the time No nausea or vomiting She notes her bowel habits are quite variable; she may have fecal urgency but does not fel this is related to her pain. More related to foods she may eat  She notes bowel irregularity over her whole life -this is not a change  I recommended that we do a pap or at least a pelvic exam but she declines this today  She wants a CT scan of her abdomen and pelvis  Pt notes she was told she had some arthritis changes in her lower back in the past- she is not sure when this was  She thinks  she had plain x-rays at some point  She will be interested in doing a CT coronary  Wt Readings from Last 3 Encounters:  04/21/24 169 lb (76.7 kg)  11/25/23 170 lb 9.6 oz (77.4 kg)  04/30/23 164 lb 3.2 oz (74.5 kg)    Patient Active Problem List   Diagnosis Date Noted   Left lower quadrant abdominal pain 07/18/2021   Urinary frequency 07/18/2021   Severe  persistent asthma 12/05/2020   Severe persistent asthma dependent on systemic steroids without complication 05/13/2019   Chronic venous insufficiency 07/25/2016   Pain of left leg 03/19/2016   LPRD (laryngopharyngeal reflux disease) 01/16/2016   Vitamin D  deficiency 06/22/2009   Backache 09/05/2008   Overweight 04/13/2008   Anxiety state 04/13/2008   Allergic rhinitis 04/13/2008   Diaphragmatic hernia 04/13/2008   Osteoarthritis 04/13/2008   Hypothyroidism 04/12/2008   Hyperlipidemia 04/12/2008    Past Medical History:  Diagnosis Date   Allergic rhinitis    Asthma    Diverticulitis    Gastroesophageal reflux disease with hiatal hernia    History of shingles    Hyperlipidemia    Hypothyroidism    Osteoarthritis    Overweight(278.02)    Vitamin D  deficiency     Past Surgical History:  Procedure Laterality Date   breast reductions  2000   in [place]   COLONOSCOPY     left knee arthroscopy  1999   by Dr. Deward   REDUCTION MAMMAPLASTY      Social History   Tobacco Use   Smoking status: Never   Smokeless tobacco: Never  Vaping Use   Vaping status: Never Used  Substance Use Topics   Alcohol use: Yes    Comment: social use   Drug use: No    Family History  Problem Relation Age of Onset   Lung cancer Sister    Heart disease Maternal Grandfather    Colon cancer Neg Hx    Stomach cancer Neg Hx    Rectal cancer Neg Hx     Allergies  Allergen Reactions   Azithromycin      zpak does not work for the pt    Medication list has been reviewed and updated.  Current Outpatient Medications on File Prior to Visit  Medication Sig Dispense Refill   albuterol  (VENTOLIN  HFA) 108 (90 Base) MCG/ACT inhaler Inhale 2 puffs into the lungs every 6 (six) hours as needed for wheezing or shortness of breath. 18 g 3   amoxicillin -clavulanate (AUGMENTIN ) 875-125 MG tablet Take 1 tablet by mouth 2 (two) times daily. 20 tablet 0   ARMOUR THYROID  15 MG tablet Take 1 tablet (15 mg  total) by mouth daily. TAKE WITH 60 MG TO EQUAL 75 MG TOTAL 90 tablet 3   EPINEPHrine  0.3 mg/0.3 mL IJ SOAJ injection INJECT 1 PEN INTO THE MUSCLE AS NEEDED FOR ANAPHYLAXIS. 2 each 2   fluticasone -salmeterol (WIXELA INHUB) 500-50 MCG/ACT AEPB Inhale 1 puff into the lungs in the morning and at bedtime. 60 each 11   lisinopril  (ZESTRIL ) 5 MG tablet TAKE 1 TABLET (5 MG TOTAL) BY MOUTH DAILY. 90 tablet 3   montelukast  (SINGULAIR ) 10 MG tablet TAKE 1 TABLET BY MOUTH EVERYDAY AT BEDTIME 90 tablet 3   omalizumab  (XOLAIR ) 150 MG/ML prefilled syringe Inject 150 mg into the skin every 28 (twenty-eight) days. 3 mL 1   pantoprazole  (PROTONIX ) 40 MG tablet Take 1 tablet (40 mg total) by mouth 2 (two) times daily. 180 tablet 1   simvastatin  (ZOCOR )  40 MG tablet TAKE 1 TABLET BY MOUTH EVERY DAY 90 tablet 3   thyroid  (ARMOUR THYROID ) 60 MG tablet Take 1 tablet (60 mg total) by mouth daily. 90 tablet 3   No current facility-administered medications on file prior to visit.    Review of Systems:  As per HPI- otherwise negative.   Physical Examination: Vitals:   04/21/24 0907  BP: 136/78  Pulse: (!) 55  SpO2: 97%   Vitals:   04/21/24 0907  Weight: 169 lb (76.7 kg)  Height: 5' 5 (1.651 m)   Body mass index is 28.12 kg/m. Ideal Body Weight: Weight in (lb) to have BMI = 25: 149.9  GEN: no acute distress.  Mildly overweight, looks well HEENT: Atraumatic, Normocephalic.  Bilateral TM wnl, oropharynx normal.  PEERL,EOMI.   Ears and Nose: No external deformity. CV: RRR, No M/G/R. No JVD. No thrill. No extra heart sounds. PULM: CTA B, no wheezes, crackles, rhonchi. No retractions. No resp. distress. No accessory muscle use. ABD: S, NT, ND, +BS. No rebound. No HSM.  Belly is benign EXTR: No c/c/e PSYCH: Normally interactive. Conversant.    Assessment and Plan: Hypothyroidism, unspecified type - Plan: TSH  Mixed hyperlipidemia - Plan: Lipid panel  Mild hypertension - Plan: CBC, Comprehensive  metabolic panel with GFR  Screening for diabetes mellitus - Plan: Comprehensive metabolic panel with GFR, Hemoglobin A1c  Encounter for breast cancer screening using non-mammogram modality - Plan: MM 3D DIAGNOSTIC MAMMOGRAM BILATERAL BREAST, CANCELED: MM DIAG BREAST TOMO BILAT NO CAD  Screening for deficiency anemia - Plan: CBC  Breast pain in female - Plan: MM 3D DIAGNOSTIC MAMMOGRAM BILATERAL BREAST, US  LIMITED ULTRASOUND INCLUDING AXILLA LEFT BREAST , US  LIMITED ULTRASOUND INCLUDING AXILLA RIGHT BREAST, CANCELED: MM DIAG BREAST TOMO BILAT NO CAD, CANCELED: US  BREAST COMPLETE UNI RIGHT INC AXILLA, CANCELED: US  BREAST COMPLETE UNI LEFT INC AXILLA  Chronic midline low back pain without sciatica - Plan: DG Lumbar Spine Complete, C-reactive protein  Left lower quadrant abdominal pain - Plan: Urine Culture, POCT urinalysis dipstick, CT ABDOMEN PELVIS W CONTRAST  Patient seen today for follow-up and concern of persistent abdominal pain for 6 to 9 months.  I was not aware she was having this persistent pain Lab evaluation pending as above Patient felt very strongly that she needed both a mammogram and breast ultrasound due to history of dense breasts-however as she then changed her mind and wanted to do a plain screening mammogram She also would like to do a CT coronary  We will set you up for -mammogram and breast ultrasound at the Breast center -[facility] can do your CT abdomen and pelvis, CT coronary and plain x-rays of your lower back   You can stop by imaging on the ground floor to set these things up- we will need to get your labs and insurance approval prior to the CT of your belly but you can do the heart CT and lumbar films asap  Recommend -tetanus vaccine -shingles vaccine -pneumonia vaccine esp with your history of asthma    Signed Harlene Schroeder, MD  Received labs as below, message to patient  Results for orders placed or performed in visit on 04/21/24  CBC    Collection Time: 04/21/24  9:49 AM  Result Value Ref Range   WBC 5.5 4.0 - 10.5 K/uL   RBC 4.17 3.87 - 5.11 Mil/uL   Platelets 205.0 150.0 - 400.0 K/uL   Hemoglobin 12.8 12.0 - 15.0 g/dL   HCT 61.9 63.9 -  46.0 %   MCV 91.3 78.0 - 100.0 fl   MCHC 33.6 30.0 - 36.0 g/dL   RDW 86.6 88.4 - 84.4 %  Comprehensive metabolic panel with GFR   Collection Time: 04/21/24  9:49 AM  Result Value Ref Range   Sodium 142 135 - 145 mEq/L   Potassium 4.0 3.5 - 5.1 mEq/L   Chloride 104 96 - 112 mEq/L   CO2 31 19 - 32 mEq/L   Glucose, Bld 106 (H) 70 - 99 mg/dL   BUN 15 6 - 23 mg/dL   Creatinine, Ser 9.28 0.40 - 1.20 mg/dL   Total Bilirubin 0.5 0.2 - 1.2 mg/dL   Alkaline Phosphatase 91 39 - 117 U/L   AST 11 0 - 37 U/L   ALT 10 0 - 35 U/L   Total Protein 6.3 6.0 - 8.3 g/dL   Albumin 4.4 3.5 - 5.2 g/dL   GFR 12.01 >39.99 mL/min   Calcium 9.2 8.4 - 10.5 mg/dL  Hemoglobin J8r   Collection Time: 04/21/24  9:49 AM  Result Value Ref Range   Hgb A1c MFr Bld 6.3 4.6 - 6.5 %  Lipid panel   Collection Time: 04/21/24  9:49 AM  Result Value Ref Range   Cholesterol 158 0 - 200 mg/dL   Triglycerides 44.9 0.0 - 149.0 mg/dL   HDL 45.19 >60.99 mg/dL   VLDL 88.9 0.0 - 59.9 mg/dL   LDL Cholesterol 92 0 - 99 mg/dL   Total CHOL/HDL Ratio 3    NonHDL 103.03   TSH   Collection Time: 04/21/24  9:49 AM  Result Value Ref Range   TSH 0.22 (L) 0.35 - 5.50 uIU/mL  C-reactive protein   Collection Time: 04/21/24  9:49 AM  Result Value Ref Range   CRP <1.0 0.5 - 20.0 mg/dL

## 2024-04-20 ENCOUNTER — Encounter (INDEPENDENT_AMBULATORY_CARE_PROVIDER_SITE_OTHER): Payer: Self-pay

## 2024-04-20 ENCOUNTER — Other Ambulatory Visit (HOSPITAL_COMMUNITY): Payer: Self-pay

## 2024-04-21 ENCOUNTER — Encounter: Payer: Self-pay | Admitting: Family Medicine

## 2024-04-21 ENCOUNTER — Ambulatory Visit: Admitting: Family Medicine

## 2024-04-21 ENCOUNTER — Ambulatory Visit (HOSPITAL_BASED_OUTPATIENT_CLINIC_OR_DEPARTMENT_OTHER)
Admission: RE | Admit: 2024-04-21 | Discharge: 2024-04-21 | Disposition: A | Discharge status: Home / Self Care | Source: Ambulatory Visit | Attending: Family Medicine | Admitting: Family Medicine

## 2024-04-21 ENCOUNTER — Telehealth: Payer: Self-pay

## 2024-04-21 ENCOUNTER — Other Ambulatory Visit: Payer: Self-pay

## 2024-04-21 ENCOUNTER — Other Ambulatory Visit (HOSPITAL_COMMUNITY): Payer: Self-pay

## 2024-04-21 VITALS — BP 136/78 | HR 55 | Ht 65.0 in | Wt 169.0 lb

## 2024-04-21 DIAGNOSIS — Z131 Encounter for screening for diabetes mellitus: Secondary | ICD-10-CM | POA: Diagnosis not present

## 2024-04-21 DIAGNOSIS — Z1239 Encounter for other screening for malignant neoplasm of breast: Secondary | ICD-10-CM

## 2024-04-21 DIAGNOSIS — M545 Low back pain, unspecified: Secondary | ICD-10-CM | POA: Diagnosis not present

## 2024-04-21 DIAGNOSIS — I10 Essential (primary) hypertension: Secondary | ICD-10-CM

## 2024-04-21 DIAGNOSIS — E782 Mixed hyperlipidemia: Secondary | ICD-10-CM

## 2024-04-21 DIAGNOSIS — Z130 Encounter for screening for diseases of the blood and blood-forming organs and certain disorders involving the immune mechanism: Secondary | ICD-10-CM | POA: Diagnosis not present

## 2024-04-21 DIAGNOSIS — G8929 Other chronic pain: Secondary | ICD-10-CM | POA: Diagnosis not present

## 2024-04-21 DIAGNOSIS — E039 Hypothyroidism, unspecified: Secondary | ICD-10-CM | POA: Diagnosis not present

## 2024-04-21 DIAGNOSIS — R1032 Left lower quadrant pain: Secondary | ICD-10-CM

## 2024-04-21 DIAGNOSIS — N644 Mastodynia: Secondary | ICD-10-CM

## 2024-04-21 LAB — HEMOGLOBIN A1C: Hgb A1c MFr Bld: 6.3 % (ref 4.6–6.5)

## 2024-04-21 LAB — TSH: TSH: 0.22 u[IU]/mL — ABNORMAL LOW (ref 0.35–5.50)

## 2024-04-21 LAB — COMPREHENSIVE METABOLIC PANEL WITH GFR
ALT: 10 U/L (ref 0–35)
AST: 11 U/L (ref 0–37)
Albumin: 4.4 g/dL (ref 3.5–5.2)
Alkaline Phosphatase: 91 U/L (ref 39–117)
BUN: 15 mg/dL (ref 6–23)
CO2: 31 meq/L (ref 19–32)
Calcium: 9.2 mg/dL (ref 8.4–10.5)
Chloride: 104 meq/L (ref 96–112)
Creatinine, Ser: 0.71 mg/dL (ref 0.40–1.20)
GFR: 87.98 mL/min (ref >60.00)
Glucose, Bld: 106 mg/dL — ABNORMAL HIGH (ref 70–99)
Potassium: 4 meq/L (ref 3.5–5.1)
Sodium: 142 meq/L (ref 135–145)
Total Bilirubin: 0.5 mg/dL (ref 0.2–1.2)
Total Protein: 6.3 g/dL (ref 6.0–8.3)

## 2024-04-21 LAB — LIPID PANEL
Cholesterol: 158 mg/dL (ref 0–200)
HDL: 54.8 mg/dL (ref >39.00)
LDL Cholesterol: 92 mg/dL (ref 0–99)
NonHDL: 103.03
Total CHOL/HDL Ratio: 3
Triglycerides: 55 mg/dL (ref 0.0–149.0)
VLDL: 11 mg/dL (ref 0.0–40.0)

## 2024-04-21 LAB — CBC
HCT: 38 % (ref 36.0–46.0)
Hemoglobin: 12.8 g/dL (ref 12.0–15.0)
MCHC: 33.6 g/dL (ref 30.0–36.0)
MCV: 91.3 fl (ref 78.0–100.0)
Platelets: 205 K/uL (ref 150.0–400.0)
RBC: 4.17 Mil/uL (ref 3.87–5.11)
RDW: 13.3 % (ref 11.5–15.5)
WBC: 5.5 K/uL (ref 4.0–10.5)

## 2024-04-21 LAB — C-REACTIVE PROTEIN: CRP: <1 mg/dL (ref 0.5–20.0)

## 2024-04-21 MED ORDER — LISINOPRIL 5 MG PO TABS
5.0000 mg | ORAL_TABLET | Freq: Every day | ORAL | 3 refills | Status: AC
Start: 2024-04-21 — End: ?

## 2024-04-21 MED ORDER — SIMVASTATIN 40 MG PO TABS: 40.0000 mg | ORAL_TABLET | Freq: Every day | ORAL | 3 refills | Status: AC

## 2024-04-21 MED ORDER — PANTOPRAZOLE SODIUM 40 MG PO TBEC: 40.0000 mg | DELAYED_RELEASE_TABLET | Freq: Two times a day (BID) | ORAL | 1 refills | Status: AC

## 2024-04-21 MED ORDER — THYROID 60 MG PO TABS: 60.0000 mg | ORAL_TABLET | Freq: Every day | ORAL | 3 refills | Status: AC

## 2024-04-21 NOTE — Progress Notes (Signed)
 Specialty Pharmacy Refill Coordination Note  MyChart Questionnaire Submission  Amanda [name] is a 68 y.o. female contacted today regarding refills of specialty medication(s) Xolair .  Injection date: (Patient-Rptd) 05/05/24  Doses on hand: (Patient-Rptd) 0   Patient requested: (Patient-Rptd) Delivery   Delivery date: 04/23/24   Verified address: (Patient-Rptd) [address]  [place], [place] [address]  Medication will be filled on 04/22/24.

## 2024-04-21 NOTE — Telephone Encounter (Signed)
 Copied from CRM [id number]. Topic: General - Call Back - No Documentation >> Apr 21, 2024 10:45 AM Mercedes MATSU wrote: Reason for CRM: Patient called in requesting to speak with Dr. Faustino nurse. She had a appointment today has a couple of questions for her.

## 2024-04-21 NOTE — Telephone Encounter (Signed)
 Spoke with pt, pt states she changed her mind and no longer wants the US  or the diagnostic mammogram. Pt states she would like just a normal mammogram pt is also requesting the Coronary CT be ordered.

## 2024-04-21 NOTE — Patient Instructions (Addendum)
  I will be in touch with your labs asap We will set you up for -mammogram and breast ultrasound at the Breast center -[facility] can do your CT abdomen and pelvis, CT coronary and plain x-rays of your lower back   You can stop by imaging on the ground floor to set these things up- we will need to get your labs and insurance approval prior to the CT of your belly but you can do the heart CT and lumbar films asap  Recommend -tetanus vaccine -shingles vaccine -pneumonia vaccine esp with your history of asthma

## 2024-04-21 NOTE — Telephone Encounter (Signed)
 Copied from CRM [id number]. Topic: Clinical - Medical Advice >> Apr 21, 2024 10:17 AM Charolett L wrote: Reason for CRM: patient is requesting a call back to change her mind about the diagnostic mammo and ultrasound. She stated she doesn't want it and just wants a reg mammo and a ct scan

## 2024-04-22 LAB — URINE CULTURE
MICRO NUMBER:: 16706383
Result:: NO GROWTH
SPECIMEN QUALITY:: ADEQUATE

## 2024-04-23 ENCOUNTER — Ambulatory Visit: Payer: Self-pay | Admitting: Family Medicine

## 2024-04-25 ENCOUNTER — Encounter: Payer: Self-pay | Admitting: Family Medicine

## 2024-04-26 ENCOUNTER — Telehealth: Payer: Self-pay | Admitting: Family Medicine

## 2024-04-26 NOTE — Telephone Encounter (Signed)
 Copied from CRM [id number]. Topic: General - Call Back - No Documentation >> Apr 26, 2024 10:45 AM Rea BROCKS wrote: Reason for CRM: Eleanor from [facility] [phone] ext [phone] called to speak with the rep who handles pre-authorizations. The patient has an appointment on the 23rd for ct abdomen and her pre-authorization was denied. Melissa is asking for a call from the authorizations rep.

## 2024-04-27 ENCOUNTER — Encounter: Payer: Self-pay | Admitting: Family Medicine

## 2024-04-27 NOTE — Telephone Encounter (Signed)
 Noted, thank you

## 2024-04-28 ENCOUNTER — Ambulatory Visit (HOSPITAL_BASED_OUTPATIENT_CLINIC_OR_DEPARTMENT_OTHER)

## 2024-04-28 ENCOUNTER — Ambulatory Visit (HOSPITAL_BASED_OUTPATIENT_CLINIC_OR_DEPARTMENT_OTHER)
Admission: RE | Admit: 2024-04-28 | Discharge: 2024-04-28 | Disposition: A | Discharge status: Home / Self Care | Payer: Self-pay | Source: Ambulatory Visit | Attending: Family Medicine | Admitting: Family Medicine

## 2024-04-28 ENCOUNTER — Encounter (HOSPITAL_BASED_OUTPATIENT_CLINIC_OR_DEPARTMENT_OTHER): Payer: Self-pay

## 2024-04-28 DIAGNOSIS — E782 Mixed hyperlipidemia: Secondary | ICD-10-CM | POA: Insufficient documentation

## 2024-04-28 DIAGNOSIS — I10 Essential (primary) hypertension: Secondary | ICD-10-CM | POA: Insufficient documentation

## 2024-04-29 ENCOUNTER — Encounter: Payer: Self-pay | Admitting: Family Medicine

## 2024-05-11 ENCOUNTER — Other Ambulatory Visit: Payer: Self-pay

## 2024-05-11 ENCOUNTER — Encounter (HOSPITAL_BASED_OUTPATIENT_CLINIC_OR_DEPARTMENT_OTHER): Payer: Self-pay

## 2024-05-11 ENCOUNTER — Ambulatory Visit (HOSPITAL_BASED_OUTPATIENT_CLINIC_OR_DEPARTMENT_OTHER)
Admission: RE | Admit: 2024-05-11 | Discharge: 2024-05-11 | Disposition: A | Discharge status: Home / Self Care | Source: Ambulatory Visit | Attending: Family Medicine | Admitting: Family Medicine

## 2024-05-11 DIAGNOSIS — Z1231 Encounter for screening mammogram for malignant neoplasm of breast: Secondary | ICD-10-CM | POA: Diagnosis present

## 2024-05-11 DIAGNOSIS — Z1239 Encounter for other screening for malignant neoplasm of breast: Secondary | ICD-10-CM

## 2024-05-13 ENCOUNTER — Other Ambulatory Visit: Payer: Self-pay | Admitting: Pharmacy Technician

## 2024-05-13 ENCOUNTER — Other Ambulatory Visit: Payer: Self-pay

## 2024-05-13 ENCOUNTER — Encounter (INDEPENDENT_AMBULATORY_CARE_PROVIDER_SITE_OTHER): Payer: Self-pay

## 2024-05-13 ENCOUNTER — Encounter: Payer: Self-pay | Admitting: Family Medicine

## 2024-05-13 DIAGNOSIS — R911 Solitary pulmonary nodule: Secondary | ICD-10-CM

## 2024-05-13 NOTE — Addendum Note (Signed)
 Addended by: WATT RAISIN C on: 05/13/2024 01:33 PM   Modules accepted: Orders

## 2024-05-13 NOTE — Progress Notes (Signed)
 Specialty Pharmacy Refill Coordination Note  Amanda [name] is a 68 y.o. female contacted today regarding refills of specialty medication(s) Omalizumab  (XOLAIR )   Patient requested (Patient-Rptd) Delivery   Delivery date: 05/27/24 Verified address: (Patient-Rptd) [address] [place], [place] [address]   Medication will be filled on 05/26/24.

## 2024-05-26 ENCOUNTER — Other Ambulatory Visit: Payer: Self-pay

## 2024-06-16 ENCOUNTER — Other Ambulatory Visit (HOSPITAL_COMMUNITY): Payer: Self-pay

## 2024-06-17 ENCOUNTER — Encounter (HOSPITAL_BASED_OUTPATIENT_CLINIC_OR_DEPARTMENT_OTHER): Payer: Self-pay | Admitting: Pulmonary Disease

## 2024-06-17 ENCOUNTER — Ambulatory Visit (HOSPITAL_BASED_OUTPATIENT_CLINIC_OR_DEPARTMENT_OTHER): Admitting: Pulmonary Disease

## 2024-06-17 VITALS — BP 138/82 | HR 56 | Ht 65.0 in | Wt 167.0 lb

## 2024-06-17 DIAGNOSIS — J4550 Severe persistent asthma, uncomplicated: Secondary | ICD-10-CM

## 2024-06-17 DIAGNOSIS — R911 Solitary pulmonary nodule: Secondary | ICD-10-CM | POA: Diagnosis not present

## 2024-06-17 MED ORDER — FLUTICASONE-SALMETEROL 500-50 MCG/ACT IN AEPB: 1.0000 | INHALATION_SPRAY | Freq: Two times a day (BID) | RESPIRATORY_TRACT | 11 refills | Status: AC

## 2024-06-17 MED ORDER — MONTELUKAST SODIUM 10 MG PO TABS: 10.0000 mg | ORAL_TABLET | Freq: Every day | ORAL | 3 refills | Status: AC

## 2024-06-17 MED ORDER — ALBUTEROL SULFATE HFA 108 (90 BASE) MCG/ACT IN AERS: 2.0000 | INHALATION_SPRAY | Freq: Four times a day (QID) | RESPIRATORY_TRACT | 2 refills | Status: AC | PRN

## 2024-06-17 NOTE — Patient Instructions (Signed)
 Severe persistent asthma - fairly controlled symptoms, breakthrough symptoms nearing injection time CONTINUE Xolair  monthly at home.  CONTINUE Wixela 500-50 mcg daily. If symptoms worsen, increase to twice a day. This is your EVERYDAY inhaler.  CONTINUE Albuterol  (prefers Ventolin ) inhaler as needed. This is your rescue inhaler CONTINUE singulair  10 mg daily. CONTINUE Zyrtec 10 mg daily Recommend aerobic exercise 30 min five days a week   Asthma Action Plan Increase Advair to ONE puff twice a day for worsening shortness of breath, wheezing and cough. If you symptoms do not improve in 24-48 hours, please our office for evaluation and/or prednisone  taper.

## 2024-06-17 NOTE — Progress Notes (Signed)
 Subjective:   PATIENT ID: Amanda [name] GENDER: female DOB: 1956-04-10, MRN: [id number]   HPI  Chief Complaint  Patient presents with   Asthma    Reason for Visit: Follow-up asthma  Amanda [name] is a 68 year old never smoker with severe persistent asthma with steroid dependence currently on Xolair , seasonal allergic rhinitis who present for follow-up for asthma  Synopsis She was previously a patient of Dr. Christi, whom she followed for many years. Last seen in October 2019 with stable asthma symptoms during that visit. Symptoms are triggered by exertion, illness, strong odors and allergies. Denies nocturnal symptoms.  She reports she overall well controlled on Xolair  and Advair 500-50 mcg once a day. She has had to be increased Advair to twice a day when she exerts herself more and during changes of seasons.   2023 - Well-controlled Xolair  and Advair 500-50 mcg once a day. During the spring and winter she may need her inhaler twice a day for worsening symptoms. Usually uses albuterol  twice a week. Notices worsening symptoms with wheezing and shortness of breath when she is nearing the time to re-dose her biologic and Advair.   10/11/22 She is compliant with her Advair 500-50 mcg daily. Compliant with Xolair . Reports recent issue with spring loaded injection so had to manually injection. She uses albuterol  once a week. Changes in weather were her worst times and will have cough and wheeze. Not in exacerbation. She had strep throat in Oct/Nov and treated with penicillin  and steroids. Denies asthma flare   04/18/23 Overall asthma is doing well, not using albuterol  often unless ill. She reports in the last two months her mother was ill and her passed away. She has slightly increased cough and wheeze with the heat and recent stress. She has increased cough, wheezing and shortness of breath prior to re-injection of Xolair  but this improves with biologic administration. Has not  needed antibiotics or steroids since last visit. Reports 1-2 nights awakenings due to nocturia.   11/25/23 Since our last visit she reports overall fair. She reports shortness of breath for the last few months including with bending over, vacuuming. Denies chest pain, palpitations, dizziness or syncope. Denies worsening cough or wheezing. Does not exercise regularly. Denies exacerbations since our last visit. Has hacky cough due to nasal congestion.  06/17/24 Since our last visit she is overall doing well. Compliant with Xolair . She has been on Advair Diskus previously. Recently started Wixela daily due to insurance change. She has breakthrough symptoms with wheezing. Does not have nebulizer at home. No exacerbations since our last visit. She started playing pickle ball recently.   Asthma Control Test ACT Total Score  06/17/2024  9:43 AM 20  11/25/2023 10:03 AM 19  04/18/2023  9:47 AM 18   Social History: Has Engineer, building services controller in [employer] exposures: Cats  Past Medical History:  Diagnosis Date   Allergic rhinitis    Asthma    Diverticulitis    Gastroesophageal reflux disease with hiatal hernia    History of shingles    Hyperlipidemia    Hypothyroidism    Osteoarthritis    Overweight(278.02)    Vitamin D  deficiency     Allergies  Allergen Reactions   Azithromycin      zpak does not work for the pt     Outpatient Medications Prior to Visit  Medication Sig Dispense Refill   ARMOUR THYROID  15 MG tablet Take 1 tablet (15 mg total) by mouth daily.  TAKE WITH 60 MG TO EQUAL 75 MG TOTAL 90 tablet 3   EPINEPHrine  0.3 mg/0.3 mL IJ SOAJ injection INJECT 1 PEN INTO THE MUSCLE AS NEEDED FOR ANAPHYLAXIS. 2 each 2   lisinopril  (ZESTRIL ) 5 MG tablet Take 1 tablet (5 mg total) by mouth daily. 90 tablet 3   omalizumab  (XOLAIR ) 150 MG/ML prefilled syringe Inject 150 mg into the skin every 28 (twenty-eight) days. 3 mL 1   pantoprazole  (PROTONIX ) 40 MG tablet Take 1  tablet (40 mg total) by mouth 2 (two) times daily. 180 tablet 1   simvastatin  (ZOCOR ) 40 MG tablet Take 1 tablet (40 mg total) by mouth daily. 90 tablet 3   thyroid  (ARMOUR THYROID ) 60 MG tablet Take 1 tablet (60 mg total) by mouth daily. 90 tablet 3   albuterol  (VENTOLIN  HFA) 108 (90 Base) MCG/ACT inhaler Inhale 2 puffs into the lungs every 6 (six) hours as needed for wheezing or shortness of breath. 18 g 3   fluticasone -salmeterol (WIXELA INHUB) 500-50 MCG/ACT AEPB Inhale 1 puff into the lungs in the morning and at bedtime. 60 each 11   montelukast  (SINGULAIR ) 10 MG tablet TAKE 1 TABLET BY MOUTH EVERYDAY AT BEDTIME 90 tablet 3   amoxicillin -clavulanate (AUGMENTIN ) 875-125 MG tablet Take 1 tablet by mouth 2 (two) times daily. (Patient not taking: Reported on 06/17/2024) 20 tablet 0   No facility-administered medications prior to visit.    Review of Systems  Constitutional:  Negative for chills, diaphoresis, fever, malaise/fatigue and weight loss.  HENT:  Negative for congestion.   Respiratory:  Positive for wheezing. Negative for cough, hemoptysis, sputum production and shortness of breath.   Cardiovascular:  Negative for chest pain, palpitations and leg swelling.   Objective:   Vitals:   06/17/24 0940  BP: 138/82  Pulse: (!) 56  SpO2: 97%  Weight: 167 lb (75.8 kg)  Height: 5' 5 (1.651 m)   SpO2: 97 %  Physical Exam: General: Well-appearing, no acute distress HENT: [place], AT Eyes: EOMI, no scleral icterus Respiratory: Clear to auscultation bilaterally.  No crackles, wheezing or rales Cardiovascular: RRR, -M/R/G, no JVD Extremities:-Edema,-tenderness Neuro: AAO x4, CNII-XII grossly intact Psych: Normal mood, normal affect   Data Reviewed:  Imaging: CXR 01/27/17 - no acute cardiopulmonary abnormalities. No infiltrate, effusion or edema CT Cardiac 04/28/24 RLL 4 mm nodule  PFT: 01/16/16 - Ratio 58 FEV1 43% FVC 58% Interpretation: Severe obstructive defect  Labs: CBC with  diff 08/06/18 - WBC 8.5 with Eos 5.3% (400 absolute eos) CBC with 07/18/21 - Absolute eos 300    Assessment & Plan:   Discussion: 68 year old female with severe persistent asthma with prior steroid dependence. Overall controlled on Xolair  and high dose ICS/LABA since 2017. No exacerbations in >4 years. Recently changed from Advair to [facility] with break through symptoms.  Discussed clinical course and management of asthma including bronchodilator regimen, preventive care including vaccinations and action plan for exacerbation.   Severe persistent asthma - fairly controlled symptoms, breakthrough symptoms nearing injection time CONTINUE Xolair  monthly at home.  CONTINUE Wixela 500-50 mcg daily. If symptoms worsen, increase to twice a day. This is your EVERYDAY inhaler.  CONTINUE Albuterol  (prefers Ventolin ) inhaler as needed. This is your rescue inhaler CONTINUE singulair  10 mg daily. CONTINUE Zyrtec 10 mg daily Recommend aerobic exercise 30 min five days a week   Asthma Action Plan Increase Advair to ONE puff twice a day for worsening shortness of breath, wheezing and cough. If you symptoms do not improve  in 24-48 hours, please our office for evaluation and/or prednisone  taper.  Subcentimeter RLL lung nodule, likely benign PCP ordered CT Chest in 1 year (July 2026). If stable no further imaging indicated  Health Maintenance Immunization History  Administered Date(s) Administered   PFIZER(Purple Top)SARS-COV-2 Vaccination 01/14/2020, 04/14/2020   Tdap 01/21/2011   No orders of the defined types were placed in this encounter.  Meds ordered this encounter  Medications   albuterol  (VENTOLIN  HFA) 108 (90 Base) MCG/ACT inhaler    Sig: Inhale 2 puffs into the lungs every 6 (six) hours as needed for wheezing or shortness of breath.    Dispense:  9 each    Refill:  2    Prefers Ventolin    montelukast  (SINGULAIR ) 10 MG tablet    Sig: Take 1 tablet (10 mg total) by mouth at bedtime.     Dispense:  90 tablet    Refill:  3   fluticasone -salmeterol (WIXELA INHUB) 500-50 MCG/ACT AEPB    Sig: Inhale 1 puff into the lungs in the morning and at bedtime.    Dispense:  60 each    Refill:  11   Return in about 6 months (around 12/15/2024).  I have spent a total time of 30-minutes on the day of the appointment including chart review, data review, collecting history, coordinating care and discussing medical diagnosis and plan with the patient/family. Past medical history, allergies, medications were reviewed. Pertinent imaging, labs and tests included in this note have been reviewed and interpreted independently by me.   Shay Bartoli Slater Staff, MD [facility] 06/17/2024 10:12 AM

## 2024-06-21 ENCOUNTER — Encounter (INDEPENDENT_AMBULATORY_CARE_PROVIDER_SITE_OTHER): Payer: Self-pay

## 2024-06-21 ENCOUNTER — Other Ambulatory Visit: Payer: Self-pay

## 2024-06-21 ENCOUNTER — Other Ambulatory Visit: Payer: Self-pay | Admitting: Pharmacy Technician

## 2024-06-21 NOTE — Progress Notes (Signed)
 Specialty Pharmacy Refill Coordination Note  Amanda [name] is a 68 y.o. female contacted today regarding refills of specialty medication(s) Omalizumab  (XOLAIR )   Patient requested Delivery   Delivery date: 06/29/24  Verified address: [address]  [place], [place] [address]   Medication will be filled on 06/28/24.  Injection date on 07/05/24  Questionnaire answered

## 2024-06-28 ENCOUNTER — Ambulatory Visit: Admitting: Family Medicine

## 2024-06-28 ENCOUNTER — Other Ambulatory Visit: Payer: Self-pay

## 2024-06-28 ENCOUNTER — Encounter: Payer: Self-pay | Admitting: Family Medicine

## 2024-06-28 ENCOUNTER — Ambulatory Visit: Payer: Self-pay | Admitting: *Deleted

## 2024-06-28 VITALS — BP 138/67 | HR 72 | Ht 65.0 in | Wt 167.0 lb

## 2024-06-28 DIAGNOSIS — R21 Rash and other nonspecific skin eruption: Secondary | ICD-10-CM | POA: Diagnosis not present

## 2024-06-28 MED ORDER — VALACYCLOVIR HCL 1 G PO TABS
1000.0000 mg | ORAL_TABLET | Freq: Three times a day (TID) | ORAL | 0 refills | Status: AC
Start: 2024-06-28 — End: ?

## 2024-06-28 NOTE — Progress Notes (Signed)
 Acute Office Visit  Subjective:     Patient ID: Amanda [name], female    DOB: [birth year], 68 y.o.   MRN: [id number]  Chief Complaint  Patient presents with   Rash     Patient is in today for rash.   Discussed the use of AI scribe software for clinical note transcription with the patient, who gave verbal consent to proceed.  History of Present Illness Abcde Amanda [name] is a 68 year old female who presents with a facial rash.  The facial rash began above her right eye on Saturday morning and has since spread downwards, involving her temporal area and somewhat into scalp. It is described as bumpy with small blisters that do not drain and is not severely red. There is no itching or burning, but she is concerned about it spreading to her eye. She denies recent changes in soaps, detergents, or shampoos, and has not eaten anything different.  She has a headache that started today and feels 'a little off'. No fever, she notes being 'always tired'.  She works in [employer], which she finds overwhelming and stressful. She has had shingles in the past, characterized by severe back pain and sickness before the rash appeared 2 weeks later, and has not received a shingles vaccine.       All review of systems negative except what is listed in the HPI      Objective:    BP 138/67   Pulse 72   Ht 5' 5 (1.651 m)   Wt 167 lb (75.8 kg)   SpO2 98%   BMI 27.79 kg/m     Physical Exam Vitals reviewed.  Skin:    General: Skin is warm and dry.     Comments: Raised rash to forehead and right temporal area with one small area on right cheek, mild erythema (makeup on, hard to determine), no open lesions  Neurological:     Mental Status: She is alert and oriented to person, place, and time.  Psychiatric:        Mood and Affect: Mood normal.        Behavior: Behavior normal.        Thought Content: Thought content normal.        Judgment: Judgment normal.                No results found for any visits on 06/28/24.      Assessment & Plan:   Problem List Items Addressed This Visit   None Visit Diagnoses       Rash    -  Primary   Relevant Medications   valACYclovir  (VALTREX ) 1000 MG tablet      Assessment & Plan  Unilateral facial rash with small blisters and redness. No recent allergen exposure. Previous shingles episode with delayed rash. No shingles vaccine. No pain or itching currently. Some generalized malaise, mild headache, fatigue.  - Prescribed Valtrex  to halt rash spread in case viral in nature. - Advised over-the-counter hydrocortisone for itchiness, caution on excessive facial use. - Instructed Zyrtec twice daily for a week for potential allergic reaction. - Added Pepcid  twice daily x1 week as histamine-2 blocker. - Advised to send pictures to MyChart if rash changes or does not improve.      Meds ordered this encounter  Medications   valACYclovir  (VALTREX ) 1000 MG tablet    Sig: Take 1 tablet (1,000 mg total) by mouth 3 (three) times daily.    Dispense:  21 tablet    Refill:  0    Supervising Provider:   DOMENICA BLACKBIRD A [[id number]]    Return if symptoms worsen or fail to improve.  Waddell KATHEE Mon, NP

## 2024-06-28 NOTE — Telephone Encounter (Signed)
 FYI Only or Action Required?: FYI only for provider.  Patient was last seen in primary care on 04/21/2024 by Copland, Harlene BROCKS, MD.  Called Nurse Triage reporting Rash.  Symptoms began several days ago.  Interventions attempted: Nothing.  Symptoms are: gradually worsening.  Triage Disposition: No disposition on file.  Patient/caregiver understands and will follow disposition?: yes   Reason for Disposition  [1] Shingles rash (matches SYMPTOMS) AND [2] onset < 72 hours ago (3 days)  Answer Assessment - Initial Assessment Questions 1. APPEARANCE of RASH: What does the rash look like? (e.g., blisters, dry flaky skin, red spots, redness, sores)     Rash- hairline to ear/cheeks, looks like blistering 2. LOCATION: Where is the rash located?      See above 3. NUMBER: How many spots are there?      multiple 4. SIZE: How big are the spots? (e.g., inches, cm; or compare to size of pinhead, tip of pen, eraser, pea)      patches 5. ONSET: When did the rash start?      Saturday am 6. ITCHING: Does the rash itch? If Yes, ask: How bad is the itch?  (Scale 0-10; or none, mild, moderate, severe)     no 7. PAIN: Does the rash hurt? If Yes, ask: How bad is the pain?  (Scale 0-10; or none, mild, moderate, severe)     no 8. OTHER SYMPTOMS: Do you have any other symptoms? (e.g., fever)     No- headache  Answer Assessment - Initial Assessment Questions 1. APPEARANCE of RASH: What does the rash look like?      Blisters that first appeared at hairline 2. LOCATION: Where is the rash located?      Hairline to ear, now cheeks 3. ONSET: When did the rash start?      Saturday 4. ITCHING: Does the rash itch? If Yes, ask: How bad is the itch?  (Scale 1-10; or mild, moderate, severe)     no 5. PAIN: Does the rash hurt? If Yes, ask: How bad is the pain?  (Scale 0-10; or none, mild, moderate, severe)     no  Protocols used: Rash or Redness - Localized-A-AH, Shingles  (Zoster)-A-AH   Copied from CRM [id number]. Topic: Clinical - Red Word Triage >> Jun 28, 2024  8:01 AM [place] A wrote: [facility] that prompted transfer to Nurse Triage: Patient has rash on her face with blisters. Patient woke up with it Saturday morning.

## 2024-07-19 ENCOUNTER — Encounter (INDEPENDENT_AMBULATORY_CARE_PROVIDER_SITE_OTHER): Payer: Self-pay

## 2024-07-20 ENCOUNTER — Other Ambulatory Visit (HOSPITAL_COMMUNITY): Payer: Self-pay

## 2024-07-20 NOTE — Progress Notes (Signed)
 Specialty Pharmacy Refill Coordination Note  Amanda [name] is a 68 y.o. female contacted today regarding refills of specialty medication(s) Omalizumab  (XOLAIR )   Patient requested Delivery   Delivery date: 07/29/24   Verified address: [address] [place], [place] [address]   Medication will be filled on 07/28/24.

## 2024-07-28 ENCOUNTER — Other Ambulatory Visit: Payer: Self-pay

## 2024-08-18 ENCOUNTER — Other Ambulatory Visit: Payer: Self-pay

## 2024-08-19 ENCOUNTER — Other Ambulatory Visit (HOSPITAL_COMMUNITY): Payer: Self-pay

## 2024-08-19 ENCOUNTER — Other Ambulatory Visit: Payer: Self-pay

## 2024-08-19 ENCOUNTER — Encounter (INDEPENDENT_AMBULATORY_CARE_PROVIDER_SITE_OTHER): Payer: Self-pay

## 2024-08-19 NOTE — Progress Notes (Signed)
 Specialty Pharmacy Refill Coordination Note  MyChart Questionnaire Submission  Amanda [name] is a 68 y.o. female contacted today regarding refills of specialty medication(s) Xolair .  Doses on hand: (Patient-Rptd) 0   Injection date: (Patient-Rptd) 09/02/24  Patient requested: (Patient-Rptd) Delivery   Delivery date: 08/24/24  Verified address: [address] [place] [address]  Medication will be filled on 08/23/24.

## 2024-08-19 NOTE — Progress Notes (Signed)
 Specialty Pharmacy Ongoing Clinical Assessment Note  Amanda [name] is a 68 y.o. female who is being followed by the specialty pharmacy service for RxSp Asthma/COPD   Patient's specialty medication(s) reviewed today: Omalizumab  (XOLAIR )   Missed doses in the last 4 weeks: 0   Patient/Caregiver did not have any additional questions or concerns.   Therapeutic benefit summary: Patient is achieving benefit   Adverse events/side effects summary: Experienced adverse events/side effects (some swelling at the injection site, recommended heat prior to injection and ice following injection, she will try this)   Patient's therapy is appropriate to: Continue    Goals Addressed             This Visit's Progress    Minimize recurrence of flares   On track    Patient is on track. Patient will maintain adherence.  Patient reports that she is very well-controlled on therapy at this time.          Follow up: 12 months  Silvano LOISE [name] Specialty Pharmacist

## 2024-08-23 ENCOUNTER — Other Ambulatory Visit: Payer: Self-pay

## 2024-09-20 ENCOUNTER — Other Ambulatory Visit: Payer: Self-pay

## 2024-09-20 NOTE — Progress Notes (Signed)
 Specialty Pharmacy Refill Coordination Note  Korine Brinn Westby is a 68 y.o. female contacted today regarding refills of specialty medication(s) Omalizumab  (XOLAIR )   Patient requested (Patient-Rptd) Delivery   Delivery date: 09/24/24   Verified address: (Patient-Rptd) [address] [place], [place] [address]   Medication will be filled on: 09/23/24

## 2024-10-09 ENCOUNTER — Other Ambulatory Visit: Payer: Self-pay | Admitting: Family Medicine

## 2024-10-09 DIAGNOSIS — E039 Hypothyroidism, unspecified: Secondary | ICD-10-CM

## 2024-10-14 ENCOUNTER — Encounter (HOSPITAL_BASED_OUTPATIENT_CLINIC_OR_DEPARTMENT_OTHER): Payer: Self-pay | Admitting: Pulmonary Disease

## 2024-10-14 DIAGNOSIS — J4550 Severe persistent asthma, uncomplicated: Secondary | ICD-10-CM

## 2024-10-18 NOTE — Telephone Encounter (Signed)
 Referral to pharmacotherapy clinic placed for [facility] to complete onboarding  Sherry Pennant, PharmD, MPH, BCPS, CPP Clinical Pharmacist

## 2024-10-18 NOTE — Progress Notes (Unsigned)
 [facility] [address] [place], [place] [address] [phone]  Date:  10/20/2024   Name:  Amanda [name]   DOB:  08-29-1956   MRN:  [id number]  PCP:  Watt Harlene BROCKS, MD    Chief Complaint: Numbness (Numbness in both feet onset for awhile ) and Back Pain (The back pain is chronic, its right in the R corner )   History of Present Illness:  Amanda [name] is a 69 y.o. very pleasant female patient who presents with the following:  Patient seen today with a few concerns.  I saw her most recently in July History of asthma, venous insufficiency, allergies, allergies, hypothyroidism, hyperlipidemia.  She does have pulmonology care for her severe persistent asthma  In July she declined tetanus, Shingrix, pneumonia vaccination, breast exam, and Pap smear  She saw pulmonology, Dr. Kassie in September 70 year old female with severe persistent asthma with prior steroid dependence. Overall controlled on Xolair  and high dose ICS/LABA since 2017. No exacerbations in >4 years. Recently changed from Advair to [facility] with break through symptoms.  Discussed clinical course and management of asthma including bronchodilator regimen, preventive care including vaccinations and action plan for exacerbation.   She had a positive Cologuard in 2022, colonoscopy January 2023- I small polyp removed- 7 year recheck recommended.  We spent some time discussing this today  Labs done in July-CMP, lipid, CBC, A1c 6.3, TSH suppressed at 0.22 needs recheck today  Discussed the use of AI scribe software for clinical note transcription with the patient, who gave verbal consent to proceed.  Lisinopril , simvastatin , Armour Thyroid  Wixela inhaler Montelukast  Xolair  injection every 28 days  She had a CT coronary in July 2025-score of 0, but they did note a right millimeter right lower lobe nodule; 1 year follow-up suggested for high  risk patients.  She is quite concerned and would like to have a repeat CT in a year Order one year CT follow-up for lung nodule in July 2026  History of Present Illness Amanda [name] is a 69 year old female who presents with chronic lower back pain and numbness in her feet.  She has been experiencing chronic lower back pain for an extended period-perhaps years, described as a persistent, nagging pain located in the right lower back. The pain has remained stable in intensity, neither worsening nor improving. She has chosen not to take any medication for the pain, including over-the-counter options like Tylenol  or ibuprofen, preferring to avoid medication unless necessary.  We did plain films of her lumbar spine in 2025 which showed degenerative changes; she is aware of arthritis in her back. She reports that her back pain has remained stable, without worsening or improvement for at least a year.  She has tried to do exercises to improve her back pain but it has not particularly helped  She also notes a group of lipomas on her lower back which she thinks could be contributing to her pain.  She also experiences numbness in her feet, which has been present for approximately two to three years. The numbness is positional, often occurring when sitting or driving, and is more pronounced in the right foot compared to the left. She describes the sensation as her foot feeling like 'a block of wood.' There is no specific timeline for when the numbness began. She has fallen arches and bunions, contributing to chronic foot pain.  She has a family  history of heart issues on her father's side and a sister who had lung cancer despite not smoking. She has been trying to be more active and has started playing pickleball since the summer.    Patient Active Problem List   Diagnosis Date Noted   Left lower quadrant abdominal pain 07/18/2021   Severe persistent asthma (HCC) 12/05/2020   Severe persistent  asthma dependent on systemic steroids without complication (HCC) 05/13/2019   Chronic venous insufficiency 07/25/2016   Pain of left leg 03/19/2016   LPRD (laryngopharyngeal reflux disease) 01/16/2016   Vitamin D  deficiency 06/22/2009   Backache 09/05/2008   Overweight 04/13/2008   Allergic rhinitis 04/13/2008   Diaphragmatic hernia 04/13/2008   Osteoarthritis 04/13/2008   Hypothyroidism 04/12/2008   Hyperlipidemia 04/12/2008    Past Medical History:  Diagnosis Date   Allergic rhinitis    Asthma    Diverticulitis    Gastroesophageal reflux disease with hiatal hernia    History of shingles    Hyperlipidemia    Hypothyroidism    Osteoarthritis    Overweight(278.02)    Vitamin D  deficiency     Past Surgical History:  Procedure Laterality Date   breast reductions  [place]   COLONOSCOPY     left knee arthroscopy  1999   by Dr. Deward   REDUCTION MAMMAPLASTY      Social History[1]  Family History  Problem Relation Age of Onset   Lung cancer Sister    Heart disease Maternal Grandfather    Colon cancer Neg Hx    Stomach cancer Neg Hx    Rectal cancer Neg Hx     Allergies[2]  Medication list has been reviewed and updated.  Medications Ordered Prior to Encounter[3]  Review of Systems:  As per HPI- otherwise negative.   Physical Examination: Vitals:   10/20/24 0938  BP: 130/78  Pulse: 60  SpO2: 99%   Vitals:   10/20/24 0938  Weight: 171 lb (77.6 kg)  Height: 5' 5 (1.651 m)   Body mass index is 28.46 kg/m. Ideal Body Weight: Weight in (lb) to have BMI = 25: 149.9  GEN: no acute distress.  Mild overweight, looks well HEENT: Atraumatic, Normocephalic.  Ears and Nose: No external deformity. CV: RRR, No M/G/R. No JVD. No thrill. No extra heart sounds. PULM: CTA B, no wheezes, crackles, rhonchi. No retractions. No resp. distress. No accessory muscle use. ABD: S, NT, ND, +BS. No rebound. No HSM. EXTR: No c/c/e PSYCH: Normally interactive.  Conversant.  Excellent thoracolumbar flexion and extension.  Normal strength of both lower extremities.  She does have significant bunions present on both feet Normal strength, sensation, deep tendon reflex both lower extremities.  She does have some possible superficial lipomas over her lower back none of which feel alarming There is reproducible tenderness and spasm of the paraspinous lumbar musculature.   Assessment and Plan: Hypothyroidism, unspecified type - Plan: TSH  Mixed hyperlipidemia - Plan: Lipid panel  Mild hypertension - Plan: CBC, Comprehensive metabolic panel with GFR  Severe persistent asthma dependent on systemic steroids without complication (HCC)  Prediabetes - Plan: Hemoglobin A1c  Numbness of right foot - Plan: Vitamin B12, Ferritin, MR Lumbar Spine Wo Contrast  Lung nodule seen on imaging study  Chronic bilateral low back pain with right-sided sciatica - Plan: MR Lumbar Spine Wo Contrast  Assessment & Plan Chronic low back pain with lower extremity numbness Chronic low back pain with right foot numbness, consider nerve compression. Previous  x-ray showed arthritis. - Ordered MRI of lumbar spine. - Discussed ibuprofen or acetaminophen  for pain. - Ordered labs including B12 and iron levels.  Bunion of foot Chronic bunion pain with foot deformities. Surgery not preferred by patient unless pain intolerable. - Discussed non-surgical management options.  Lipoma of trunk Multiple lipomas on trunk, not causing back pain. - Offered ultrasound of lipoma if desired.  Pulmonary nodule, right lung 4 mm nodule in right lung, low risk, family history of lung cancer. - Ordered repeat imaging in one year.  History of colonic polyp Colonic polyp removed in 2023, follow-up colonoscopy in 7 years recommended.  General health maintenance Routine health maintenance discussed. - Encouraged physical activity like pickleball as tolerated - Discussed importance of routine  screenings and follow-ups.  Signed Harlene Schroeder, MD Received her labs as below, message to patient  Results for orders placed or performed in visit on 10/20/24  CBC   Collection Time: 10/20/24 10:54 AM  Result Value Ref Range   WBC 5.9 4.0 - 10.5 K/uL   RBC 4.33 3.87 - 5.11 Mil/uL   Platelets 223.0 150.0 - 400.0 K/uL   Hemoglobin 13.5 12.0 - 15.0 g/dL   HCT 60.0 63.9 - 53.9 %   MCV 92.2 78.0 - 100.0 fl   MCHC 33.9 30.0 - 36.0 g/dL   RDW 86.5 88.4 - 84.4 %  Comprehensive metabolic panel with GFR   Collection Time: 10/20/24 10:54 AM  Result Value Ref Range   Sodium 141 135 - 145 mEq/L   Potassium 4.3 3.5 - 5.1 mEq/L   Chloride 103 96 - 112 mEq/L   CO2 31 19 - 32 mEq/L   Glucose, Bld 91 70 - 99 mg/dL   BUN 9 6 - 23 mg/dL   Creatinine, Ser 9.28 0.40 - 1.20 mg/dL   Total Bilirubin 0.6 0.2 - 1.2 mg/dL   Alkaline Phosphatase 99 39 - 117 U/L   AST 13 5 - 37 U/L   ALT 12 3 - 35 U/L   Total Protein 6.8 6.0 - 8.3 g/dL   Albumin 4.6 3.5 - 5.2 g/dL   GFR 12.32 >39.99 mL/min   Calcium 9.2 8.4 - 10.5 mg/dL  Hemoglobin J8r   Collection Time: 10/20/24 10:54 AM  Result Value Ref Range   Hgb A1c MFr Bld 6.2 4.6 - 6.5 %  Lipid panel   Collection Time: 10/20/24 10:54 AM  Result Value Ref Range   Cholesterol 163 28 - 200 mg/dL   Triglycerides 15.9 89.9 - 149.0 mg/dL   HDL 41.19 >60.99 mg/dL   VLDL 83.1 0.0 - 59.9 mg/dL   LDL Cholesterol 87 10 - 99 mg/dL   Total CHOL/HDL Ratio 3    NonHDL 104.16   TSH   Collection Time: 10/20/24 10:54 AM  Result Value Ref Range   TSH 5.24 0.35 - 5.50 uIU/mL  Vitamin B12   Collection Time: 10/20/24 10:54 AM  Result Value Ref Range   Vitamin B-12 136 (L) 211 - 911 pg/mL  Ferritin   Collection Time: 10/20/24 10:54 AM  Result Value Ref Range   Ferritin 232.8 10.0 - 291.0 ng/mL       [1]  Social History Tobacco Use   Smoking status: Never   Smokeless tobacco: Never  Vaping Use   Vaping status: Never Used  Substance Use Topics    Alcohol use: Yes    Comment: social use   Drug use: No  [2]  Allergies Allergen Reactions   Azithromycin   zpak does not work for the pt  [3]  Current Outpatient Medications on File Prior to Visit  Medication Sig Dispense Refill   albuterol  (VENTOLIN  HFA) 108 (90 Base) MCG/ACT inhaler Inhale 2 puffs into the lungs every 6 (six) hours as needed for wheezing or shortness of breath. 9 each 2   ARMOUR THYROID  15 MG tablet TAKE 1 TABLET BY MOUTH DAILY, WITH 60 MG TO EQUAL 75 MG TOTAL 90 tablet 3   EPINEPHrine  0.3 mg/0.3 mL IJ SOAJ injection INJECT 1 PEN INTO THE MUSCLE AS NEEDED FOR ANAPHYLAXIS. 2 each 2   fluticasone -salmeterol (WIXELA INHUB) 500-50 MCG/ACT AEPB Inhale 1 puff into the lungs in the morning and at bedtime. 60 each 11   lisinopril  (ZESTRIL ) 5 MG tablet Take 1 tablet (5 mg total) by mouth daily. 90 tablet 3   montelukast  (SINGULAIR ) 10 MG tablet Take 1 tablet (10 mg total) by mouth at bedtime. 90 tablet 3   omalizumab  (XOLAIR ) 150 MG/ML prefilled syringe Inject 150 mg into the skin every 28 (twenty-eight) days. 3 mL 1   pantoprazole  (PROTONIX ) 40 MG tablet Take 1 tablet (40 mg total) by mouth 2 (two) times daily. 180 tablet 1   simvastatin  (ZOCOR ) 40 MG tablet Take 1 tablet (40 mg total) by mouth daily. 90 tablet 3   thyroid  (ARMOUR THYROID ) 60 MG tablet Take 1 tablet (60 mg total) by mouth daily. 90 tablet 3   valACYclovir  (VALTREX ) 1000 MG tablet Take 1 tablet (1,000 mg total) by mouth 3 (three) times daily. 21 tablet 0   No current facility-administered medications on file prior to visit.   "

## 2024-10-20 ENCOUNTER — Encounter: Payer: Self-pay | Admitting: Family Medicine

## 2024-10-20 ENCOUNTER — Ambulatory Visit: Admitting: Family Medicine

## 2024-10-20 VITALS — BP 130/78 | HR 60 | Ht 65.0 in | Wt 171.0 lb

## 2024-10-20 DIAGNOSIS — R7303 Prediabetes: Secondary | ICD-10-CM

## 2024-10-20 DIAGNOSIS — I10 Essential (primary) hypertension: Secondary | ICD-10-CM

## 2024-10-20 DIAGNOSIS — E538 Deficiency of other specified B group vitamins: Secondary | ICD-10-CM

## 2024-10-20 DIAGNOSIS — Z7952 Long term (current) use of systemic steroids: Secondary | ICD-10-CM | POA: Diagnosis not present

## 2024-10-20 DIAGNOSIS — R200 Anesthesia of skin: Secondary | ICD-10-CM | POA: Diagnosis not present

## 2024-10-20 DIAGNOSIS — E782 Mixed hyperlipidemia: Secondary | ICD-10-CM | POA: Diagnosis not present

## 2024-10-20 DIAGNOSIS — R911 Solitary pulmonary nodule: Secondary | ICD-10-CM | POA: Diagnosis not present

## 2024-10-20 DIAGNOSIS — E039 Hypothyroidism, unspecified: Secondary | ICD-10-CM

## 2024-10-20 DIAGNOSIS — J4550 Severe persistent asthma, uncomplicated: Secondary | ICD-10-CM

## 2024-10-20 DIAGNOSIS — M5441 Lumbago with sciatica, right side: Secondary | ICD-10-CM

## 2024-10-20 DIAGNOSIS — G8929 Other chronic pain: Secondary | ICD-10-CM

## 2024-10-20 LAB — CBC
HCT: 39.9 % (ref 36.0–46.0)
Hemoglobin: 13.5 g/dL (ref 12.0–15.0)
MCHC: 33.9 g/dL (ref 30.0–36.0)
MCV: 92.2 fl (ref 78.0–100.0)
Platelets: 223 K/uL (ref 150.0–400.0)
RBC: 4.33 Mil/uL (ref 3.87–5.11)
RDW: 13.4 % (ref 11.5–15.5)
WBC: 5.9 K/uL (ref 4.0–10.5)

## 2024-10-20 LAB — HEMOGLOBIN A1C: Hgb A1c MFr Bld: 6.2 % (ref 4.6–6.5)

## 2024-10-20 LAB — COMPREHENSIVE METABOLIC PANEL WITH GFR
ALT: 12 U/L (ref 3–35)
AST: 13 U/L (ref 5–37)
Albumin: 4.6 g/dL (ref 3.5–5.2)
Alkaline Phosphatase: 99 U/L (ref 39–117)
BUN: 9 mg/dL (ref 6–23)
CO2: 31 meq/L (ref 19–32)
Calcium: 9.2 mg/dL (ref 8.4–10.5)
Chloride: 103 meq/L (ref 96–112)
Creatinine, Ser: 0.71 mg/dL (ref 0.40–1.20)
GFR: 87.67 mL/min
Glucose, Bld: 91 mg/dL (ref 70–99)
Potassium: 4.3 meq/L (ref 3.5–5.1)
Sodium: 141 meq/L (ref 135–145)
Total Bilirubin: 0.6 mg/dL (ref 0.2–1.2)
Total Protein: 6.8 g/dL (ref 6.0–8.3)

## 2024-10-20 LAB — LIPID PANEL
Cholesterol: 163 mg/dL (ref 28–200)
HDL: 58.8 mg/dL
LDL Cholesterol: 87 mg/dL (ref 10–99)
NonHDL: 104.16
Total CHOL/HDL Ratio: 3
Triglycerides: 84 mg/dL (ref 10.0–149.0)
VLDL: 16.8 mg/dL (ref 0.0–40.0)

## 2024-10-20 LAB — TSH: TSH: 5.24 u[IU]/mL (ref 0.35–5.50)

## 2024-10-20 LAB — VITAMIN B12: Vitamin B-12: 136 pg/mL — ABNORMAL LOW (ref 211–911)

## 2024-10-20 LAB — FERRITIN: Ferritin: 232.8 ng/mL (ref 10.0–291.0)

## 2024-10-20 NOTE — Patient Instructions (Addendum)
 I will be in touch with your labs and we will set up an MRI for your lumbar spine

## 2024-10-22 ENCOUNTER — Encounter: Payer: Self-pay | Admitting: Family Medicine

## 2024-10-22 ENCOUNTER — Other Ambulatory Visit (HOSPITAL_BASED_OUTPATIENT_CLINIC_OR_DEPARTMENT_OTHER): Payer: Self-pay | Admitting: Pulmonary Disease

## 2024-10-22 ENCOUNTER — Other Ambulatory Visit: Payer: Self-pay

## 2024-10-22 DIAGNOSIS — G8929 Other chronic pain: Secondary | ICD-10-CM

## 2024-10-22 DIAGNOSIS — J4550 Severe persistent asthma, uncomplicated: Secondary | ICD-10-CM

## 2024-10-22 MED ORDER — OMALIZUMAB 150 MG/ML ~~LOC~~ SOSY
150.0000 mg | PREFILLED_SYRINGE | SUBCUTANEOUS | 1 refills | Status: AC
  Filled 2024-10-22: qty 1, 28d supply, fill #0

## 2024-10-22 NOTE — Telephone Encounter (Signed)
 Refill sent for XOLAIR  to [facility]: [phone]   Dose: 150mg  [place] every 28 days   Last OV: 06/17/24 Provider: Dr. Kassie   Next OV: 12/07/24  Amanda [name], PharmD, BCPS Clinical Pharmacist  [facility]

## 2024-10-22 NOTE — Telephone Encounter (Signed)
 Pt requesting refill of specialty medication - routing to Rx team to advise.

## 2024-10-22 NOTE — Progress Notes (Signed)
 Specialty Pharmacy Refill Coordination Note  Amanda [name] is a 69 y.o. female contacted today regarding refills of specialty medication(s) Omalizumab  (XOLAIR )   Patient requested Delivery   Delivery date: 10/27/24   Verified address: [address] [place], [place] [address]   Medication will be filled on: 10/26/24    This fill date is pending response to refill request from provider. Patient is aware and if they have not received fill by intended date they must follow up with pharmacy.

## 2024-10-25 ENCOUNTER — Telehealth: Payer: Self-pay

## 2024-10-25 NOTE — Telephone Encounter (Signed)
 Received notification via specialty pharmacy encounter that Xolair  requires pa. Submitted an urgent Prior Authorization request to [facility] for XOLAIR  via CoverMyMeds. Will update once we receive a response.  Key: [id number]

## 2024-10-26 ENCOUNTER — Other Ambulatory Visit (HOSPITAL_COMMUNITY): Payer: Self-pay

## 2024-10-26 ENCOUNTER — Other Ambulatory Visit: Payer: Self-pay

## 2024-10-27 ENCOUNTER — Other Ambulatory Visit (HOSPITAL_COMMUNITY): Payer: Self-pay

## 2024-10-27 NOTE — Progress Notes (Signed)
 PA approved until 10/25/25

## 2024-10-27 NOTE — Telephone Encounter (Signed)
 Received notification from [facility] regarding a prior authorization for XOLAIR . Authorization has been APPROVED from 10/25/24 to 10/25/25. Approval letter sent to scan center.  Patient can continue to fill through [facility]: [phone]   Authorization # [id number] Phone # [phone]

## 2024-11-01 ENCOUNTER — Encounter: Payer: Self-pay | Admitting: Rehabilitative and Restorative Service Providers"

## 2024-11-02 ENCOUNTER — Ambulatory Visit: Admitting: Physical Therapy

## 2024-11-04 NOTE — Therapy (Unsigned)
 " OUTPATIENT PHYSICAL THERAPY THORACOLUMBAR EVALUATION   Patient Name: Amanda [name] MRN: [id number] DOB:1956-05-12, 69 y.o., female Today's Date: 11/04/2024  END OF SESSION:   Past Medical History:  Diagnosis Date   Allergic rhinitis    Asthma    Diverticulitis    Gastroesophageal reflux disease with hiatal hernia    History of shingles    Hyperlipidemia    Hypothyroidism    Osteoarthritis    Overweight(278.02)    Vitamin D  deficiency    Past Surgical History:  Procedure Laterality Date   breast reductions  2000   in [place]   COLONOSCOPY     left knee arthroscopy  1999   by Dr. Deward   REDUCTION MAMMAPLASTY     Patient Active Problem List   Diagnosis Date Noted   Left lower quadrant abdominal pain 07/18/2021   Severe persistent asthma (HCC) 12/05/2020   Severe persistent asthma dependent on systemic steroids without complication (HCC) 05/13/2019   Chronic venous insufficiency 07/25/2016   Pain of left leg 03/19/2016   LPRD (laryngopharyngeal reflux disease) 01/16/2016   Vitamin D  deficiency 06/22/2009   Backache 09/05/2008   Overweight 04/13/2008   Allergic rhinitis 04/13/2008   Diaphragmatic hernia 04/13/2008   Osteoarthritis 04/13/2008   Hypothyroidism 04/12/2008   Hyperlipidemia 04/12/2008    PCP: Dr Harlene BROCKS Copland  REFERRING PROVIDER: Dr Harlene BROCKS Copland  REFERRING DIAG: Chronic Bilat LBP; R sided sciatica   Rationale for Evaluation and Treatment: Rehabilitation  THERAPY DIAG:  No diagnosis found.  ONSET DATE: ***  SUBJECTIVE:                                                                                                                                                                                           SUBJECTIVE STATEMENT: Patient reports that she has had LBP over a period of many years with no significant change in symptoms. She does not like to take medication so she does not take OTC meds. She has tried some exercises  without success.  Patient also reports that she has numbness in her feet R > L, which has been present in the past 2-3 years. Numbness in positional occurring when patient is sitting or driving.   PERTINENT HISTORY:  Chronic LBP; fallen arches; bunions; L TKA 1999; breast reduction 2000; asthma; diverticulitis; GERD; hx of shingles; hyperlipidemia; OA; obesity; vit D defieiency  PAIN:  Are you having pain? Yes: NPRS scale: *** Pain location: *** Pain description: *** Aggravating factors: *** Relieving factors: ***  PRECAUTIONS: {Therapy precautions:24002}  RED FLAGS: {PT Red Flags:29287}   WEIGHT BEARING RESTRICTIONS: No  FALLS:  Has  patient fallen in last 6 months? {fallsyesno:27318}  LIVING ENVIRONMENT: Lives with: {OPRC lives with:25569::lives with their family} Lives in: House/apartment Stairs: {opstairs:27293} Has following equipment at home: {Assistive devices:23999}  OCCUPATION: ***  PLOF: Independent  PATIENT GOALS: ***  NEXT MD VISIT: ***  OBJECTIVE:  Note: Objective measures were completed at Evaluation unless otherwise noted.  DIAGNOSTIC FINDINGS:  Lumbar xray 07/19/24: Normal alignment without acute osseous finding or fracture. Preserved vertebral body heights. No compression fracture, focal kyphosis or wedge-shaped deformity. Similar degenerative changes of the lower thoracic spine and in the L5-S1 level with sclerosis and disc space narrowing. Facet arthropathy at L5-S1 as well. No pars defects appreciated. SI joints are maintained. Included pelvis unremarkable. Aorta atherosclerotic.  PATIENT SURVEYS:  {rehab surveys:24030}  COGNITION: Overall cognitive status: Within functional limits for tasks assessed     SENSATION: Intermittent numbness R > L feet more when in sitting or driving   MUSCLE LENGTH: Hamstrings: Right *** deg; Left *** deg Debby test: Right *** deg; Left *** deg  POSTURE: {posture:25561}  PALPATION: ***  LUMBAR ROM:    AROM eval  Flexion   Extension   Right lateral flexion   Left lateral flexion   Right rotation   Left rotation    (Blank rows = not tested)  LOWER EXTREMITY ROM:     Active  Right eval Left eval  Hip flexion    Hip extension    Hip abduction    Hip adduction    Hip internal rotation    Hip external rotation    Knee flexion    Knee extension    Ankle dorsiflexion    Ankle plantarflexion    Ankle inversion    Ankle eversion     (Blank rows = not tested)  LOWER EXTREMITY MMT:    MMT Right eval Left eval  Hip flexion    Hip extension    Hip abduction    Hip adduction    Hip internal rotation    Hip external rotation    Knee flexion    Knee extension    Ankle dorsiflexion    Ankle plantarflexion    Ankle inversion    Ankle eversion     (Blank rows = not tested)  LUMBAR SPECIAL TESTS:  Straight leg raise test: {pos/neg:25243}, Slump test: {pos/neg:25243}, and Single leg stance test: {pos/neg:25243}  FUNCTIONAL TESTS:  5 times sit to stand: ***  GAIT: Distance walked: 40 feet Assistive device utilized: {Assistive devices:23999} Level of assistance: {Levels of assistance:24026} Comments: ***  TREATMENT DATE: ***                                                                                                                                 PATIENT EDUCATION:  Education details: POC; HEP  Person educated: Patient Education method: Programmer, Multimedia, Facilities Manager, Actor cues, Verbal cues, and Handouts Education comprehension: verbalized understanding, returned demonstration, verbal cues required, tactile cues required, and needs  further education  HOME EXERCISE PROGRAM: ***  ASSESSMENT:  CLINICAL IMPRESSION: Patient is a 69 y.o. female who was seen today for physical therapy evaluation and treatment for chronic bilat LBP; R sided sciatica.   OBJECTIVE IMPAIRMENTS: {opptimpairments:25111}.   ACTIVITY LIMITATIONS:  {activitylimitations:27494}  PARTICIPATION LIMITATIONS: {participationrestrictions:25113}  PERSONAL FACTORS: {Personal factors:25162} are also affecting patient's functional outcome.   REHAB POTENTIAL: Good  CLINICAL DECISION MAKING: Evolving/moderate complexity  EVALUATION COMPLEXITY: Moderate   GOALS: Goals reviewed with patient? Yes  SHORT TERM GOALS: Target date: ***  Independent in initial HEP  Baseline: Goal status: INITIAL  2.  *** Baseline:  Goal status: INITIAL  3.  *** Baseline:  Goal status: INITIAL   LONG TERM GOALS: Target date: ***  *** Baseline:  Goal status: INITIAL  2.  *** Baseline:  Goal status: INITIAL  3.  *** Baseline:  Goal status: INITIAL  4.  *** Baseline:  Goal status: INITIAL  5.  *** Baseline:  Goal status: INITIAL  6.  *** Baseline:  Goal status: INITIAL  PLAN:  PT FREQUENCY: 2x/week  PT DURATION: 8 weeks  PLANNED INTERVENTIONS: 97164- PT Re-evaluation, 97110-Therapeutic exercises, 97530- Therapeutic activity, 97112- Neuromuscular re-education, 97535- Self Care, 02859- Manual therapy, [id number]- Gait training, [phone]- Aquatic Therapy, Patient/Family education, Balance training, Stair training, Taping, and Joint mobilization.  PLAN FOR NEXT SESSION: review and progress exercises and home program; continue with spine care and ergonomic education; manual work and modalities as indicated.    Bastian Andreoli P Kyndal Heringer, PT 11/04/2024, 1:13 PM  "

## 2024-11-08 ENCOUNTER — Ambulatory Visit: Admitting: Rehabilitative and Restorative Service Providers"

## 2024-11-09 ENCOUNTER — Other Ambulatory Visit: Payer: Self-pay

## 2024-11-10 NOTE — Therapy (Unsigned)
 " OUTPATIENT PHYSICAL THERAPY THORACOLUMBAR EVALUATION   Patient Name: Amanda [name] MRN: [id number] DOB:August 27, 1956, 69 y.o., female Today's Date: 11/11/2024  END OF SESSION:  PT End of Session - 11/11/24 1336     Visit Number 1    Number of Visits 16    Date for Recertification  01/08/24    Authorization Type auth required [employer] $3000 deduc; 20% coins; no copay 30 visits per calendar year    Authorization Time Period 30/year    Authorization - Visit Number 1    Authorization - Number of Visits 30    PT Start Time 0930    PT Stop Time 1020    PT Time Calculation (min) 50 min    Activity Tolerance Patient tolerated treatment well          Past Medical History:  Diagnosis Date   Allergic rhinitis    Asthma    Diverticulitis    Gastroesophageal reflux disease with hiatal hernia    History of shingles    Hyperlipidemia    Hypothyroidism    Osteoarthritis    Overweight(278.02)    Vitamin D  deficiency    Past Surgical History:  Procedure Laterality Date   breast reductions  2000   in [place]   COLONOSCOPY     left knee arthroscopy  1999   by Dr. Deward   REDUCTION MAMMAPLASTY     Patient Active Problem List   Diagnosis Date Noted   Left lower quadrant abdominal pain 07/18/2021   Severe persistent asthma (HCC) 12/05/2020   Severe persistent asthma dependent on systemic steroids without complication (HCC) 05/13/2019   Chronic venous insufficiency 07/25/2016   Pain of left leg 03/19/2016   LPRD (laryngopharyngeal reflux disease) 01/16/2016   Vitamin D  deficiency 06/22/2009   Backache 09/05/2008   Overweight 04/13/2008   Allergic rhinitis 04/13/2008   Diaphragmatic hernia 04/13/2008   Osteoarthritis 04/13/2008   Hypothyroidism 04/12/2008   Hyperlipidemia 04/12/2008    PCP: Dr Harlene BROCKS Copland  REFERRING PROVIDER: Dr Harlene BROCKS Copland  REFERRING DIAG: Chrinic bilat LBP with R sided sciatica   Rationale for Evaluation and Treatment:  Rehabilitation  THERAPY DIAG:  Other low back pain  Other symptoms and signs involving the musculoskeletal system  Muscle weakness (generalized)  ONSET DATE: 05/07/24 increased symptoms   SUBJECTIVE:                                                                                                                                                                                           SUBJECTIVE STATEMENT: Patient reports that she has experienced LBP with R sided sciatica for ~ 3  years. She has had pain in the R posterior hip on an intermittent basis.   PERTINENT HISTORY:  Asthma; venous insufficiency; allergies; hypothyroidism; hyperlipidemia; nodule R lower lobe of lung  PAIN:  Are you having pain? Yes: NPRS scale: 0/10; worst in past week 4/10  Pain location: R back  Pain description: sudden, shock pain at times Aggravating factors: bending; lifting Relieving factors: biofreeze   PRECAUTIONS: Other: nodule  lung on CT scan 7/25  RED FLAGS: None   WEIGHT BEARING RESTRICTIONS: No  FALLS:  Has patient fallen in last 6 months? 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  DIAGNOSTIC FINDINGS:  Xray 04/21/24: Normal alignment without acute osseous finding or fracture. Preserved vertebral body heights. No compression fracture, focal kyphosis or wedge-shaped deformity. Similar degenerative changes of the lower thoracic spine and in the L5-S1 level with  sclerosis and disc space narrowing. Facet arthropathy at L5-S1 as well. No pars defects appreciated. SI joints are maintained. Included pelvis unremarkable. Aorta atherosclerotic.  PATIENT SURVEYS:  Modified Oswestry 6/50; 12%   COGNITION: Overall cognitive status: Within functional limits for tasks assessed     SENSATION: Numbness both feet with prolonged sitting especially in car    MUSCLE LENGTH: Hamstrings: Right 70 deg; Left 70 deg Thomas test: Right -5 deg; Left -5 deg  POSTURE: rounded shoulders, forward head, increased thoracic kyphosis, flexed trunk , and R hemipelvis lower than L   PALPATION: Significant muscular tightness R > L hip flexors; R piriformis, glut med/min; glut max  Stiffness with PA mobs lumbar and thoracic spine   LUMBAR ROM:   AROM eval  Flexion 90%  Extension 60  Right lateral flexion 75  Left lateral flexion 70  Right rotation 65  Left rotation 60   (Blank rows = not tested)  LOWER EXTREMITY ROM:     Active  Right eval Left eval  Hip flexion [facility] WFL  Hip extension -5 -5  Hip abduction [facility]  Hip adduction    Hip internal rotation Tight  WFL  Hip external rotation Tight WFL  Knee flexion WFL Tight  Knee extension    Ankle dorsiflexion    Ankle plantarflexion    Ankle inversion    Ankle eversion     (Blank rows = not tested)  LOWER EXTREMITY MMT:    MMT Right eval Left eval  Hip flexion 4+ 4+  Hip extension 4 4+  Hip abduction 4 4+  Hip adduction    Hip internal rotation    Hip external rotation    Knee flexion    Knee extension    Ankle dorsiflexion    Ankle plantarflexion    Ankle inversion    Ankle eversion     (Blank rows = not tested)  LUMBAR SPECIAL TESTS:  Straight leg raise test: negative, Slump test: Negative, and Single leg stance test: Negative R 10 sec; 15 sec L   FUNCTIONAL TESTS:  5 times sit to stand: 12.76 sec  GAIT: Distance walked: 40 feet Assistive device utilized: None Level of  assistance: Complete Independence Comments: WFL's  TREATMENT DATE: 11/11/24                                                                                                                                 PATIENT EDUCATION:  Education details: POC; HEP  Person educated: Patient Education method: Programmer, Multimedia, Facilities Manager, Actor cues, Verbal cues, and Handouts Education comprehension: verbalized understanding, returned demonstration, verbal cues required, tactile cues required, and needs further education  HOME EXERCISE PROGRAM: Access Code: [email or web address] URL: [email or web address] Date: 11/11/2024 Prepared by: Kaylamarie Swickard  Exercises - Prone Press Up  - 2 x daily - 7 x weekly - 1 sets - 10 reps - 2-3 sec  hold - Standing Lumbar Extension  - 2 x daily - 7 x weekly - 1 sets - 2-3 reps - 2-3 sec  hold - Supine Piriformis Stretch with Leg Straight  - 2 x daily - 7 x weekly - 1 sets - 3 reps - 30 sec  hold - Hooklying Hamstring Stretch with Strap  - 1 x daily - 7 x weekly - 1 sets - 3 reps - 30 sec  hold - Hip Flexor Stretch at Edge of Bed  - 2 x daily - 7 x weekly - 1 sets - 3 reps - 30 sec  hold - Seated Hip Flexor Stretch  - 2 x daily - 7 x weekly - 1 sets - 3 reps - 30 sec  hold - Supine Transversus Abdominis Bracing with Pelvic Floor Contraction  - 2 x daily - 7 x weekly - 1 sets - 10 reps - 10sec  hold - Standing Piriformis Release with Ball at Wall  - 2 x daily - 7 x weekly - 30-60 sec  hold  Patient Education - Office Posture  ASSESSMENT:  CLINICAL IMPRESSION: Patient is a 69 y.o. female who was seen today for physical therapy evaluation and treatment for chronic LBP with R sided sciatica. She presents with abnormal posture and alignment; decreased end range trunk ROM/mobility; muscular tightness to palpation through bilat hip flexors, R posterior hip through piriformis and gluts; decreased strength bilat LE's; occupational predisposition to presenting symptoms due to  prolonged sitting over 40+ years. She will benefit from PT to address problems identified.   OBJECTIVE IMPAIRMENTS: decreased activity tolerance, decreased balance, decreased mobility, decreased ROM, decreased strength, increased fascial restrictions, increased muscle spasms, improper body mechanics, postural dysfunction, and pain.   ACTIVITY LIMITATIONS: lifting, bending, and sitting  PARTICIPATION LIMITATIONS: cleaning and driving  PERSONAL FACTORS: Fitness, Past/current experiences, Profession, and Time since onset of injury/illness/exacerbation are also affecting patient's functional outcome.   REHAB POTENTIAL: Good  CLINICAL DECISION MAKING: Evolving/moderate complexity  EVALUATION COMPLEXITY: Moderate   GOALS: Goals reviewed with patient? Yes  SHORT TERM GOALS: Target date: 12/09/2024   Independent in initial HEP  Baseline: Goal status: INITIAL  2.  Improve  tissue extensibility through hip flexor to allow improved hip extension  Baseline:  Goal status: INITIAL  3.  Decrease frequency, intensity of pain in the R LB/buttock area by 25%  Baseline:  Goal status: INITIAL   LONG TERM GOALS: Target date: 01/07/2025    Decrease frequency, intensity of pain in the R LB/buttock area by 50-75%  Baseline:  Goal status: INITIAL  2.  Increase core and LE strength with patient to tolerate 20-30 minutes of exercise with no increase in R back or R posterior hip/buttock pain Baseline:  Goal status: INITIAL  3.  Increase LE strength to 4+/5 to 5/5  Baseline:  Goal status: INITIAL  4.  Improve Modified Oswestry score by 5% indicating improved functional activity tolerance Baseline: Modified Oswestry 6/50; 12% Goal status: INITIAL  5.  Independent in HEP  Baseline:  Goal status: INITIAL   PLAN:  PT FREQUENCY: 2x/week  PT DURATION: 8 weeks  PLANNED INTERVENTIONS: 97164- PT Re-evaluation, 97110-Therapeutic exercises, 97530- Therapeutic activity, 97112- Neuromuscular  re-education, 97535- Self Care, [id number]- Manual therapy, [phone]- Aquatic Therapy, Patient/Family education, Balance training, Stair training, Taping, and Joint mobilization.  PLAN FOR NEXT SESSION: review and progress exercises; continue with spine care and ergonomic education; manual work and modalities as indicated    Amanda Steuart SHAUNNA Baptist, PT 11/11/2024, 1:43 PM   For all possible CPT codes, reference the Planned Interventions line above.     Check all conditions that are expected to impact treatment: {Conditions expected to impact treatment:Musculoskeletal disorders    "

## 2024-11-11 ENCOUNTER — Encounter: Payer: Self-pay | Admitting: Rehabilitative and Restorative Service Providers"

## 2024-11-11 ENCOUNTER — Other Ambulatory Visit: Payer: Self-pay

## 2024-11-11 ENCOUNTER — Ambulatory Visit: Admitting: Rehabilitative and Restorative Service Providers"

## 2024-11-11 DIAGNOSIS — R29898 Other symptoms and signs involving the musculoskeletal system: Secondary | ICD-10-CM

## 2024-11-11 DIAGNOSIS — M6281 Muscle weakness (generalized): Secondary | ICD-10-CM

## 2024-11-11 DIAGNOSIS — M5459 Other low back pain: Secondary | ICD-10-CM

## 2024-11-15 ENCOUNTER — Ambulatory Visit: Admitting: Rehabilitative and Restorative Service Providers"

## 2024-11-24 ENCOUNTER — Ambulatory Visit: Admitting: Rehabilitative and Restorative Service Providers"

## 2024-12-07 ENCOUNTER — Ambulatory Visit (HOSPITAL_BASED_OUTPATIENT_CLINIC_OR_DEPARTMENT_OTHER): Admitting: Pulmonary Disease
